# Patient Record
Sex: Female | Born: 1960 | Race: White | Hispanic: No | Marital: Married | Smoking: Never smoker
Health system: Southern US, Community
[De-identification: ages and names within clinical notes are randomized; demographics above are authoritative.]

## PROBLEM LIST (undated history)

## (undated) DIAGNOSIS — C801 Malignant (primary) neoplasm, unspecified: Secondary | ICD-10-CM

## (undated) DIAGNOSIS — J302 Other seasonal allergic rhinitis: Secondary | ICD-10-CM

## (undated) DIAGNOSIS — G43909 Migraine, unspecified, not intractable, without status migrainosus: Secondary | ICD-10-CM

## (undated) DIAGNOSIS — Z9889 Other specified postprocedural states: Secondary | ICD-10-CM

## (undated) DIAGNOSIS — E119 Type 2 diabetes mellitus without complications: Secondary | ICD-10-CM

## (undated) DIAGNOSIS — J45909 Unspecified asthma, uncomplicated: Secondary | ICD-10-CM

## (undated) DIAGNOSIS — K219 Gastro-esophageal reflux disease without esophagitis: Secondary | ICD-10-CM

## (undated) DIAGNOSIS — E669 Obesity, unspecified: Secondary | ICD-10-CM

## (undated) DIAGNOSIS — Z803 Family history of malignant neoplasm of breast: Secondary | ICD-10-CM

## (undated) DIAGNOSIS — G4730 Sleep apnea, unspecified: Secondary | ICD-10-CM

## (undated) DIAGNOSIS — Z923 Personal history of irradiation: Secondary | ICD-10-CM

## (undated) DIAGNOSIS — Z8049 Family history of malignant neoplasm of other genital organs: Secondary | ICD-10-CM

## (undated) DIAGNOSIS — K760 Fatty (change of) liver, not elsewhere classified: Secondary | ICD-10-CM

## (undated) DIAGNOSIS — R112 Nausea with vomiting, unspecified: Secondary | ICD-10-CM

## (undated) DIAGNOSIS — E785 Hyperlipidemia, unspecified: Secondary | ICD-10-CM

## (undated) DIAGNOSIS — M1990 Unspecified osteoarthritis, unspecified site: Secondary | ICD-10-CM

## (undated) DIAGNOSIS — Z800 Family history of malignant neoplasm of digestive organs: Secondary | ICD-10-CM

## (undated) DIAGNOSIS — T8859XA Other complications of anesthesia, initial encounter: Secondary | ICD-10-CM

## (undated) DIAGNOSIS — K589 Irritable bowel syndrome without diarrhea: Secondary | ICD-10-CM

## (undated) DIAGNOSIS — D649 Anemia, unspecified: Secondary | ICD-10-CM

## (undated) DIAGNOSIS — T4145XA Adverse effect of unspecified anesthetic, initial encounter: Secondary | ICD-10-CM

## (undated) DIAGNOSIS — M5030 Other cervical disc degeneration, unspecified cervical region: Secondary | ICD-10-CM

## (undated) DIAGNOSIS — Z87442 Personal history of urinary calculi: Secondary | ICD-10-CM

## (undated) HISTORY — PX: EXCISION OF ENDOMETRIOMA: SHX6473

## (undated) HISTORY — PX: ABDOMINAL HYSTERECTOMY: SHX81

## (undated) HISTORY — DX: Family history of malignant neoplasm of breast: Z80.3

## (undated) HISTORY — DX: Family history of malignant neoplasm of other genital organs: Z80.49

## (undated) HISTORY — PX: CHOLECYSTECTOMY: SHX55

## (undated) HISTORY — PX: TONSILLECTOMY: SUR1361

## (undated) HISTORY — DX: Family history of malignant neoplasm of digestive organs: Z80.0

---
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
## 2015-05-16 ENCOUNTER — Ambulatory Visit
Admission: EM | Admit: 2015-05-16 | Discharge: 2015-05-16 | Disposition: A | Discharge status: Home / Self Care | Payer: BLUE CROSS/BLUE SHIELD | Attending: Family Medicine | Admitting: Family Medicine

## 2015-05-16 ENCOUNTER — Ambulatory Visit: Payer: BLUE CROSS/BLUE SHIELD

## 2015-05-16 DIAGNOSIS — J40 Bronchitis, not specified as acute or chronic: Secondary | ICD-10-CM | POA: Insufficient documentation

## 2015-05-16 DIAGNOSIS — R05 Cough: Secondary | ICD-10-CM | POA: Diagnosis present

## 2015-05-16 HISTORY — DX: Migraine, unspecified, not intractable, without status migrainosus: G43.909

## 2015-05-16 HISTORY — DX: Unspecified asthma, uncomplicated: J45.909

## 2015-05-16 MED ORDER — LEVOFLOXACIN 500 MG PO TABS: 500.0000 mg | ORAL_TABLET | Freq: Every day | ORAL | Status: DC

## 2015-05-16 MED ORDER — ALBUTEROL SULFATE HFA 108 (90 BASE) MCG/ACT IN AERS: 1.0000 | INHALATION_SPRAY | Freq: Four times a day (QID) | RESPIRATORY_TRACT | Status: DC | PRN

## 2015-05-16 NOTE — ED Notes (Signed)
Pt states "I completed Augmentin on July first but I still have chest and nasal congestion. I have a brown productive cough. They did not do a chest xray when I was there. I have be sweaty at night, but know documented fevers."

## 2015-05-16 NOTE — ED Provider Notes (Signed)
Patient presents today with symptoms of nasal congestion and productive cough. Patient states that she recently finished Augmentin beginning of July for upper respiratory infection. She has been having sweats at night, and a yellow brown productive cough for the last few days. She is afebrile in the office today. She does admit to history of asthma but does not take any inhalers. She denies any smoking history. She denies any recent weight loss or international travel.  Review of systems negative except mentioned above.  Vitals as per Epic  GENERAL: NAD HEENT: mild pharyngeal erythema, no exudate, no erythema of TMs, no cervical LAD RESP: CTA B CARD: RRR NEURO: CN II-XII grossly intact   A/P: Bronchitis-chest x-ray does not show an obvious pneumonia but does suggest bronchitis, patient is not toxic in the office. Will prescribe patient a week of Levaquin and Albuterol Inhaler to use as needed. She can continue her cough suppressant as she has been taking. If symptoms do persist or worsen she is to seek medical attention as discussed.  Paulina Fusi, MD 05/16/15 [phone]

## 2015-05-19 ENCOUNTER — Encounter: Payer: Self-pay | Admitting: Emergency Medicine

## 2015-05-19 ENCOUNTER — Emergency Department
Admission: EM | Admit: 2015-05-19 | Discharge: 2015-05-19 | Disposition: A | Discharge status: Home / Self Care | Payer: BLUE CROSS/BLUE SHIELD | Attending: Emergency Medicine | Admitting: Emergency Medicine

## 2015-05-19 ENCOUNTER — Emergency Department: Payer: BLUE CROSS/BLUE SHIELD

## 2015-05-19 DIAGNOSIS — M25561 Pain in right knee: Secondary | ICD-10-CM

## 2015-05-19 DIAGNOSIS — Z79899 Other long term (current) drug therapy: Secondary | ICD-10-CM | POA: Diagnosis not present

## 2015-05-19 HISTORY — DX: Other seasonal allergic rhinitis: J30.2

## 2015-05-19 HISTORY — DX: Irritable bowel syndrome, unspecified: K58.9

## 2015-05-19 HISTORY — DX: Gastro-esophageal reflux disease without esophagitis: K21.9

## 2015-05-19 MED ORDER — HYDROCODONE-ACETAMINOPHEN 5-325 MG PO TABS
1.0000 | ORAL_TABLET | Freq: Once | ORAL | Status: AC
  Administered 2015-05-19: 1 via ORAL
  Filled 2015-05-19: qty 1

## 2015-05-19 MED ORDER — HYDROCODONE-ACETAMINOPHEN 5-325 MG PO TABS: 1.0000 | ORAL_TABLET | ORAL | Status: DC | PRN

## 2015-05-19 NOTE — ED Notes (Signed)
States has not had ain like this before

## 2015-05-19 NOTE — ED Provider Notes (Signed)
[facility] Emergency Department Provider Note ____________________________________________  Time seen: Approximately 8:52 PM  I have reviewed the triage vital signs and the nursing notes.   HISTORY  Chief Complaint Knee Pain   HPI Diane Cox is a 54 y.o. female who presents to the emergency department for pain in the right posterior knee. She denies injury. She denies associated symptoms such as chest pain or shortness of breath. She states that the knee feels somewhat weak upon standing initially but then is fine when she ambulates. She is concerned that she just started Levaquin and read the side effects are tendon rupture.   Past Medical History  Diagnosis Date  . Asthma   . Migraines   . Seasonal allergies   . IBS (irritable bowel syndrome)   . GERD (gastroesophageal reflux disease)     There are no active problems to display for this patient.   Past Surgical History  Procedure Laterality Date  . Tonsillectomy    . Cholecystectomy    . Abdominal hysterectomy      Current Outpatient Rx  Name  Route  Sig  Dispense  Refill  . cholecalciferol (VITAMIN D) 1000 UNITS tablet   Oral   Take 2,000 Units by mouth daily.         Marland Kitchen albuterol (PROVENTIL HFA;VENTOLIN HFA) 108 (90 BASE) MCG/ACT inhaler   Inhalation   Inhale 1-2 puffs into the lungs every 6 (six) hours as needed for wheezing or shortness of breath.   1 Inhaler   0   . atorvastatin (LIPITOR) 10 MG tablet   Oral   Take 10 mg by mouth daily.         . benzonatate (TESSALON) 100 MG capsule   Oral   Take by mouth 3 (three) times daily as needed for cough.         Marland Kitchen buPROPion (WELLBUTRIN XL) 150 MG 24 hr tablet   Oral   Take 150 mg by mouth daily.         . carisoprodol (SOMA) 350 MG tablet   Oral   Take 350 mg by mouth 3 (three) times daily between meals as needed for muscle spasms.         . celecoxib (CELEBREX) 200 MG capsule   Oral   Take 200 mg by mouth  daily.         Marland Kitchen escitalopram (LEXAPRO) 5 MG tablet   Oral   Take 5 mg by mouth daily.         . hydrochlorothiazide (MICROZIDE) 12.5 MG capsule   Oral   Take 12.5 mg by mouth daily.         Marland Kitchen HYDROcodone-acetaminophen (NORCO/VICODIN) 5-325 MG per tablet   Oral   Take 1 tablet by mouth every 4 (four) hours as needed for moderate pain.   20 tablet   0   . hyoscyamine (LEVBID) 0.375 MG 12 hr tablet   Oral   Take 0.375 mg by mouth 2 (two) times daily.         Marland Kitchen levofloxacin (LEVAQUIN) 500 MG tablet   Oral   Take 1 tablet (500 mg total) by mouth daily.   7 tablet   0   . montelukast (SINGULAIR) 10 MG tablet   Oral   Take 10 mg by mouth at bedtime.         Marland Kitchen omeprazole (PRILOSEC) 20 MG capsule   Oral   Take 20 mg by mouth daily.         Marland Kitchen  sucralfate (CARAFATE) 1 G tablet   Oral   Take 1 g by mouth 4 (four) times daily -  with meals and at bedtime.         . topiramate (TOPAMAX) 25 MG capsule   Oral   Take 25 mg by mouth 2 (two) times daily.           Allergies Compazine  No family history on file.  Social History History  Substance Use Topics  . Smoking status: Never Smoker   . Smokeless tobacco: Not on file  . Alcohol Use: Yes    Review of Systems Constitutional: No recent illness. Eyes: No visual changes. ENT: No sore throat. Cardiovascular: Denies chest pain or palpitations. Respiratory: Denies shortness of breath. Gastrointestinal: No abdominal pain.  Genitourinary: Negative for dysuria. Musculoskeletal: Pain in right posterior knee with radiation into ankle Skin: Negative for rash. Neurological: Negative for headaches, focal weakness or numbness. 10-point ROS otherwise negative.  ____________________________________________   PHYSICAL EXAM:  VITAL SIGNS: ED Triage Vitals  Enc Vitals Group     BP 05/19/15 1756 119/79 mmHg     Pulse Rate 05/19/15 1756 100     Resp 05/19/15 1756 18     Temp 05/19/15 1756 98.3 F (36.8 C)      Temp Source 05/19/15 1756 Oral     SpO2 05/19/15 1756 99 %     Weight 05/19/15 1756 225 lb (102.059 kg)     Height 05/19/15 1756 5\' 1"  (1.549 m)     Head Cir --      Peak Flow --      Pain Score 05/19/15 1801 9     Pain Loc --      Pain Edu? --      Excl. in [place]? --     Constitutional: Alert and oriented. Well appearing and in no acute distress. Eyes: Conjunctivae are normal. EOMI. Head: Atraumatic. Nose: No congestion/rhinnorhea. Neck: No stridor.  Respiratory: Normal respiratory effort.   Musculoskeletal: ROM limited by pain.  Neurologic:  Normal speech and language. No gross focal neurologic deficits are appreciated. Speech is normal. No gait instability. Skin:  Skin is warm, dry and intact. Atraumatic. Psychiatric: Mood and affect are normal. Speech and behavior are normal.  ____________________________________________   LABS (all labs ordered are listed, but only abnormal results are displayed)  Labs Reviewed - No data to display ____________________________________________  RADIOLOGY  Negative for DVT. ____________________________________________   PROCEDURES  Procedure(s) performed: None   ____________________________________________   INITIAL IMPRESSION / ASSESSMENT AND PLAN / ED COURSE  Pertinent labs & imaging results that were available during my care of the patient were reviewed by me and considered in my medical decision making (see chart for details).  Patient was advised to follow-up with the primary care provider of her choice for symptoms that are not improving over the next few days. She was encouraged to return to the emergency department for symptoms that change or worsen if she is unable schedule an appointment. ____________________________________________   FINAL CLINICAL IMPRESSION(S) / ED DIAGNOSES  Final diagnoses:  Posterior right knee pain       Victorino Dike, FNP 05/19/15 2116  Hinda Kehr, MD 05/19/15 2207

## 2015-05-19 NOTE — Discharge Instructions (Signed)

## 2015-05-29 ENCOUNTER — Ambulatory Visit
Admission: EM | Admit: 2015-05-29 | Discharge: 2015-05-29 | Disposition: A | Discharge status: Home / Self Care | Payer: BLUE CROSS/BLUE SHIELD | Attending: Internal Medicine | Admitting: Internal Medicine

## 2015-05-29 DIAGNOSIS — R05 Cough: Secondary | ICD-10-CM | POA: Diagnosis not present

## 2015-05-29 DIAGNOSIS — R059 Cough, unspecified: Secondary | ICD-10-CM

## 2015-05-29 DIAGNOSIS — M25561 Pain in right knee: Secondary | ICD-10-CM

## 2015-05-29 MED ORDER — DICLOFENAC SODIUM 1 % TD GEL: 2.0000 g | Freq: Four times a day (QID) | TRANSDERMAL | Status: DC

## 2015-05-29 NOTE — ED Notes (Signed)
Patient states that she was seen here 2 weeks ago. She states that was given Levaquin for Bronchitis and it cause leg pain. Patient states that she has R knee popping. She states that she still has the tightness in her chest from her bronchitis, states that she coughs but only at night. States that she overall does not feel well.

## 2015-05-29 NOTE — Discharge Instructions (Signed)
Cool Mist Vaporizers °Vaporizers may help relieve the symptoms of a cough and cold. They add moisture to the air, which helps mucus to become thinner and less sticky. This makes it easier to breathe and cough up secretions. Cool mist vaporizers do not cause serious burns like hot mist vaporizers, which may also be called steamers or humidifiers. Vaporizers have not been proven to help with colds. You should not use a vaporizer if you are allergic to mold. °HOME CARE INSTRUCTIONS °· Follow the package instructions for the vaporizer. °· Do not use anything other than distilled water in the vaporizer. °· Do not run the vaporizer all of the time. This can cause mold or bacteria to grow in the vaporizer. °· Clean the vaporizer after each time it is used. °· Clean and dry the vaporizer well before storing it. °· Stop using the vaporizer if worsening respiratory symptoms develop. °Document Released: 07/17/2004 Document Revised: 10/25/2013 Document Reviewed: 03/09/2013 °ExitCare® Patient Information ©2015 [employer]. This information is not intended to replace advice given to you by your health care provider. Make sure you discuss any questions you have with your health care provider. ° °Cough, Adult ° A cough is a reflex. It helps you clear your throat and airways. A cough can help heal your body. A cough can last 2 or 3 weeks (acute) or may last more than 8 weeks (chronic). Some common causes of a cough can include an infection, allergy, or a cold. °HOME CARE °· Only take medicine as told by your doctor. °· If given, take your medicines (antibiotics) as told. Finish them even if you start to feel better. °· Use a cold steam vaporizer or humidifier in your home. This can help loosen thick spit (secretions). °· Sleep so you are almost sitting up (semi-upright). Use pillows to do this. This helps reduce coughing. °· Rest as needed. °· Stop smoking if you smoke. °GET HELP RIGHT AWAY IF: °· You have yellowish-white fluid  (pus) in your thick spit. °· Your cough gets worse. °· Your medicine does not reduce coughing, and you are losing sleep. °· You cough up blood. °· You have trouble breathing. °· Your pain gets worse and medicine does not help. °· You have a fever. °MAKE SURE YOU:  °· Understand these instructions. °· Will watch your condition. °· Will get help right away if you are not doing well or get worse. °Document Released: 07/03/2011 Document Revised: 03/06/2014 Document Reviewed: 07/03/2011 °ExitCare® Patient Information ©2015 [employer]. This information is not intended to replace advice given to you by your health care provider. Make sure you discuss any questions you have with your health care provider. ° °

## 2015-05-29 NOTE — ED Provider Notes (Signed)
CSN: [id number]     Arrival date & time 05/29/15  1304 History   First MD Initiated Contact with Patient 05/29/15 1359     Chief Complaint  Patient presents with  . Knee Pain  . Cough   (Consider location/radiation/quality/duration/timing/severity/associated sxs/prior Treatment) HPI 54 yo F reports recent bout of bronchitis.She was seen here 2 weeks ago and started on Levaquin. Stopped her Levaquin Rx after 4 days because of some right knee pain increase. Read in her meds information that tendon injury was possible.Was seen in the ER and medication stopped. Has had lonstanding arthritis and chronic knee pain prior to the Rx per our conversation but became anxious about medication. Continues to be ambulatory without assistance. Her chest is generally better for most of the day though still has some cough. Has Benzonatate at home and finds it very effective.  Past Medical History  Diagnosis Date  . Asthma   . Migraines   . Seasonal allergies   . IBS (irritable bowel syndrome)   . GERD (gastroesophageal reflux disease)    Past Surgical History  Procedure Laterality Date  . Tonsillectomy    . Cholecystectomy    . Abdominal hysterectomy     Family History  Problem Relation Age of Onset  . Heart attack Father    History  Substance Use Topics  . Smoking status: Never Smoker   . Smokeless tobacco: Not on file  . Alcohol Use: 0.0 oz/week    0 Standard drinks or equivalent per week     Comment: occasional   OB History    No data available     Review of Systems  Constitutional -afebrile Eyes-denies visual changes ENT- normal voice,denies sore throat CV-denies chest pain Resp-denies SOB, some cough still present,non-productive GI- negative for nausea,vomiting, diarrhea GU- negative for dysuria MSK- negative for back pain, ambulatory; right knee aches, no instability reported Skin- denies acute changes Neuro- negative headache,focal weakness or numbness   Allergies   Compazine  Home Medications   Prior to Admission medications   Medication Sig Start Date End Date Taking? Authorizing Provider  albuterol (PROVENTIL HFA;VENTOLIN HFA) 108 (90 BASE) MCG/ACT inhaler Inhale 1-2 puffs into the lungs every 6 (six) hours as needed for wheezing or shortness of breath. 05/16/15  Yes Paulina Fusi, MD  atorvastatin (LIPITOR) 10 MG tablet Take 10 mg by mouth daily.   Yes Historical Provider, MD  benzonatate (TESSALON) 100 MG capsule Take by mouth 3 (three) times daily as needed for cough.   Yes Historical Provider, MD  buPROPion (WELLBUTRIN XL) 150 MG 24 hr tablet Take 150 mg by mouth daily.   Yes Historical Provider, MD  carisoprodol (SOMA) 350 MG tablet Take 350 mg by mouth 3 (three) times daily between meals as needed for muscle spasms.   Yes Historical Provider, MD  celecoxib (CELEBREX) 200 MG capsule Take 200 mg by mouth daily.   Yes Historical Provider, MD  cholecalciferol (VITAMIN D) 1000 UNITS tablet Take 2,000 Units by mouth daily.   Yes Historical Provider, MD  escitalopram (LEXAPRO) 5 MG tablet Take 5 mg by mouth daily.   Yes Historical Provider, MD  hydrochlorothiazide (MICROZIDE) 12.5 MG capsule Take 12.5 mg by mouth daily.   Yes Historical Provider, MD  hyoscyamine (LEVBID) 0.375 MG 12 hr tablet Take 0.375 mg by mouth 2 (two) times daily.   Yes Historical Provider, MD  montelukast (SINGULAIR) 10 MG tablet Take 10 mg by mouth at bedtime.   Yes Historical Provider, MD  omeprazole (  PRILOSEC) 20 MG capsule Take 20 mg by mouth daily.   Yes Historical Provider, MD  sucralfate (CARAFATE) 1 G tablet Take 1 g by mouth 4 (four) times daily -  with meals and at bedtime.   Yes Historical Provider, MD  topiramate (TOPAMAX) 25 MG capsule Take 25 mg by mouth 2 (two) times daily.   Yes Historical Provider, MD  diclofenac sodium (VOLTAREN) 1 % GEL Apply 2 g topically 4 (four) times daily. 05/29/15   Jan Fireman, PA-C  HYDROcodone-acetaminophen (NORCO/VICODIN) 5-325 MG per  tablet Take 1 tablet by mouth every 4 (four) hours as needed for moderate pain. 05/19/15   Victorino Dike, FNP  levofloxacin (LEVAQUIN) 500 MG tablet Take 1 tablet (500 mg total) by mouth daily. 05/16/15   Paulina Fusi, MD   BP 125/84 mmHg  Pulse 76  Temp(Src) 97.5 F (36.4 C) (Tympanic)  Resp 16  Ht 5\' 1"  (1.549 m)  Wt 225 lb (102.059 kg)  BMI 42.54 kg/m2  SpO2 98% Physical Exam  Constitutional -alert and oriented,well appearing and in no acute distress Head-atraumatic, normocephalic Eyes- conjunctiva normal, EOMI ,conjugate gaze Ears- clear Nose- no congestion or rhinorrhea Mouth/throat- mucous membranes moist ,oropharynx non-erythematous Neck- supple without glandular enlargement CV- regular rate, grossly normal heart sounds,  Resp-no distress, normal respiratory effort,clear to auscultation bilaterally GI-,no distention GU-not examined MSK- no tender, normal ROM, all extremities, ambulatory. Right knee with palpable crepitus, left knee less so. DTRs equal, good pulses, ambulates with minimal asymmetry, mild antalgia Neuro- normal speech and language, no gross focal neurological deficit appreciated,  Skin-warm,dry ,intact; no rash noted Psych-mood and affect grossly normal; speech and behavior grossly normal ED Course  Procedures (including critical care time) Labs Review Labs Reviewed - No data to display  Imaging Review No results found.   MDM   1. Cough   2. Right knee pain    Diagnosis and treatment discussed.-  Doubt any need to reinstate antibiotic at this time .She is afebrile and we have discussed the common viral basis for bronchitis. She has inhaler and Singulair, add Steam, hydration and cough medicines as needed.  Has Benzonatate . Discussed post viral tussive syndrome. Her knee appears to be chronic changes of arthritic nature.She denies any significant increase in her limitation. Use heat or ice wraps as comfortable. Given Rx for Voltaren gel as her GERD  precludes the use of chronic NSAIDs   Questions fielded, expectations and recommendations reviewed. Patient expresses understanding. Will return to [facility] with questions, concern or exacerbation.     Jan Fireman, PA-C 05/30/15 2251

## 2015-07-23 ENCOUNTER — Emergency Department: Payer: BLUE CROSS/BLUE SHIELD

## 2015-07-23 ENCOUNTER — Emergency Department
Admission: EM | Admit: 2015-07-23 | Discharge: 2015-07-23 | Disposition: A | Discharge status: Home / Self Care | Payer: BLUE CROSS/BLUE SHIELD | Attending: Emergency Medicine | Admitting: Emergency Medicine

## 2015-07-23 ENCOUNTER — Encounter: Payer: Self-pay | Admitting: Emergency Medicine

## 2015-07-23 DIAGNOSIS — J45901 Unspecified asthma with (acute) exacerbation: Secondary | ICD-10-CM | POA: Diagnosis not present

## 2015-07-23 DIAGNOSIS — Z792 Long term (current) use of antibiotics: Secondary | ICD-10-CM | POA: Insufficient documentation

## 2015-07-23 DIAGNOSIS — Z791 Long term (current) use of non-steroidal anti-inflammatories (NSAID): Secondary | ICD-10-CM | POA: Insufficient documentation

## 2015-07-23 DIAGNOSIS — Z79899 Other long term (current) drug therapy: Secondary | ICD-10-CM | POA: Insufficient documentation

## 2015-07-23 DIAGNOSIS — R0602 Shortness of breath: Secondary | ICD-10-CM | POA: Diagnosis present

## 2015-07-23 MED ORDER — IPRATROPIUM-ALBUTEROL 0.5-2.5 (3) MG/3ML IN SOLN
3.0000 mL | Freq: Once | RESPIRATORY_TRACT | Status: AC
  Administered 2015-07-23: 3 mL via RESPIRATORY_TRACT

## 2015-07-23 MED ORDER — BUDESONIDE 90 MCG/ACT IN AEPB: 1.0000 | INHALATION_SPRAY | Freq: Two times a day (BID) | RESPIRATORY_TRACT | Status: DC

## 2015-07-23 MED ORDER — PREDNISONE 20 MG PO TABS: 60.0000 mg | ORAL_TABLET | Freq: Every day | ORAL | Status: DC

## 2015-07-23 MED ORDER — IPRATROPIUM-ALBUTEROL 0.5-2.5 (3) MG/3ML IN SOLN
RESPIRATORY_TRACT | Status: AC
  Administered 2015-07-23: 3 mL via RESPIRATORY_TRACT
  Filled 2015-07-23: qty 3

## 2015-07-23 MED ORDER — ALBUTEROL SULFATE HFA 108 (90 BASE) MCG/ACT IN AERS
INHALATION_SPRAY | RESPIRATORY_TRACT | Status: AC
Start: 2015-07-23 — End: ?

## 2015-07-23 NOTE — ED Provider Notes (Signed)
[facility] Emergency Department Provider Note  ____________________________________________  Time seen: Approximately 3:49 PM  I have reviewed the triage vital signs and the nursing notes.   HISTORY  Chief Complaint Shortness of Breath    HPI Diane Cox is a 54 y.o. female with a history of asthma, seasonal allergies, IBS, and GERD who presents with shortness of breath that she states feels like prior asthma exacerbations.  She states that she has been having increased mild shortness of breath for the last 2 weeks, especially after multiple allergic exposures and her various places of work.  She occasionally has some chest tightness associated with her acute shortness of breath and wheezing but not in the last few days.  Today she felt like her symptoms were worse with wheezing and difficulty breathing.  She has been using her albuterol inhaler but it has not been helping.  She states that she stopped using her Pulmicort about a year ago because she thought she was well enough under control that she did not need to take it, but she thinks that she should start taking it again.  She currently does not have a primary care doctor but she has an appointment over the next month at [facility] to establish primary care.  She received a DuoNeb prior to to my assessment is she states that she feels much better now.   Past Medical History  Diagnosis Date  . Asthma   . Migraines   . Seasonal allergies   . IBS (irritable bowel syndrome)   . GERD (gastroesophageal reflux disease)     There are no active problems to display for this patient.   Past Surgical History  Procedure Laterality Date  . Tonsillectomy    . Cholecystectomy    . Abdominal hysterectomy      Current Outpatient Rx  Name  Route  Sig  Dispense  Refill  . albuterol (PROVENTIL HFA;VENTOLIN HFA) 108 (90 BASE) MCG/ACT inhaler      Inhale 4-6 puffs by mouth every 4 hours as needed for  wheezing, cough, and/or shortness of breath   1 Inhaler   1   . atorvastatin (LIPITOR) 10 MG tablet   Oral   Take 10 mg by mouth daily.         . benzonatate (TESSALON) 100 MG capsule   Oral   Take by mouth 3 (three) times daily as needed for cough.         . Budesonide (PULMICORT FLEXHALER) 90 MCG/ACT inhaler   Inhalation   Inhale 1 puff into the lungs 2 (two) times daily.   1 Inhaler   0   . buPROPion (WELLBUTRIN XL) 150 MG 24 hr tablet   Oral   Take 150 mg by mouth daily.         . carisoprodol (SOMA) 350 MG tablet   Oral   Take 350 mg by mouth 3 (three) times daily between meals as needed for muscle spasms.         . celecoxib (CELEBREX) 200 MG capsule   Oral   Take 200 mg by mouth daily.         . cholecalciferol (VITAMIN D) 1000 UNITS tablet   Oral   Take 2,000 Units by mouth daily.         . diclofenac sodium (VOLTAREN) 1 % GEL   Topical   Apply 2 g topically 4 (four) times daily.   100 g   0   . escitalopram (  LEXAPRO) 5 MG tablet   Oral   Take 5 mg by mouth daily.         . hydrochlorothiazide (MICROZIDE) 12.5 MG capsule   Oral   Take 12.5 mg by mouth daily.         Marland Kitchen HYDROcodone-acetaminophen (NORCO/VICODIN) 5-325 MG per tablet   Oral   Take 1 tablet by mouth every 4 (four) hours as needed for moderate pain.   20 tablet   0   . hyoscyamine (LEVBID) 0.375 MG 12 hr tablet   Oral   Take 0.375 mg by mouth 2 (two) times daily.         Marland Kitchen levofloxacin (LEVAQUIN) 500 MG tablet   Oral   Take 1 tablet (500 mg total) by mouth daily.   7 tablet   0   . montelukast (SINGULAIR) 10 MG tablet   Oral   Take 10 mg by mouth at bedtime.         Marland Kitchen omeprazole (PRILOSEC) 20 MG capsule   Oral   Take 20 mg by mouth daily.         . predniSONE (DELTASONE) 20 MG tablet   Oral   Take 3 tablets (60 mg total) by mouth daily.   15 tablet   0   . sucralfate (CARAFATE) 1 G tablet   Oral   Take 1 g by mouth 4 (four) times daily -  with  meals and at bedtime.         . topiramate (TOPAMAX) 25 MG capsule   Oral   Take 25 mg by mouth 2 (two) times daily.           Allergies Compazine  Family History  Problem Relation Age of Onset  . Heart attack Father     Social History Social History  Substance Use Topics  . Smoking status: Never Smoker   . Smokeless tobacco: None  . Alcohol Use: 0.0 oz/week    0 Standard drinks or equivalent per week     Comment: occasional    Review of Systems Constitutional: No fever/chills Eyes: No visual changes. ENT: No sore throat. Cardiovascular: Denies chest pain. Respiratory: Mild shortness of breath and wheezing intermittently for 2 weeks Gastrointestinal: No abdominal pain.  No nausea, no vomiting.  No diarrhea.  No constipation. Genitourinary: Negative for dysuria. Musculoskeletal: Negative for back pain. Skin: Negative for rash. Neurological: Negative for headaches, focal weakness or numbness.  10-point ROS otherwise negative.  ____________________________________________   PHYSICAL EXAM:  VITAL SIGNS: ED Triage Vitals  Enc Vitals Group     BP 07/23/15 1248 154/84 mmHg     Pulse Rate 07/23/15 1248 118     Resp 07/23/15 1248 24     Temp 07/23/15 1248 97.9 F (36.6 C)     Temp Source 07/23/15 1248 Oral     SpO2 07/23/15 1248 96 %     Weight 07/23/15 1248 221 lb (100.245 kg)     Height 07/23/15 1248 5\' 1"  (1.549 m)     Head Cir --      Peak Flow --      Pain Score 07/23/15 1249 7     Pain Loc --      Pain Edu? --      Excl. in [place]? --     Constitutional: Alert and oriented. Well appearing and in no acute distress. Eyes: Conjunctivae are normal. PERRL. EOMI. Head: Atraumatic. Nose: No congestion/rhinnorhea. Mouth/Throat: Mucous membranes are moist.  Oropharynx non-erythematous. Neck:  No stridor.   Cardiovascular: Normal rate, regular rhythm. Grossly normal heart sounds.  Good peripheral circulation. Respiratory: Normal respiratory effort.  No  retractions.  Very minimal expiratory wheeze at the very end of expiration. Gastrointestinal: Soft and nontender. No distention. No abdominal bruits. No CVA tenderness. Musculoskeletal: No lower extremity tenderness nor edema.  No joint effusions. Neurologic:  Normal speech and language. No gross focal neurologic deficits are appreciated.  Skin:  Skin is warm, dry and intact. No rash noted. Psychiatric: Mood and affect are normal. Speech and behavior are normal.  ____________________________________________   LABS (all labs ordered are listed, but only abnormal results are displayed)  Labs Reviewed - No data to display ____________________________________________  EKG  Not indicated ____________________________________________  RADIOLOGY   Dg Chest 2 View  07/23/2015   CLINICAL DATA:  Intermittent asthma attacks this weekend with cough and shortness of breath today  EXAM: CHEST  2 VIEW  COMPARISON:  05/16/2015  FINDINGS: Heart size and vascular pattern normal. Mild perihilar bronchitic change stable. No consolidation or effusion.  IMPRESSION: No active cardiopulmonary disease.   Electronically Signed   By: Skipper Cliche M.D.   On: 07/23/2015 16:28    ____________________________________________   PROCEDURES  Procedure(s) performed: None  Critical Care performed: No ____________________________________________   INITIAL IMPRESSION / ASSESSMENT AND PLAN / ED COURSE  Pertinent labs & imaging results that were available during my care of the patient were reviewed by me and considered in my medical decision making (see chart for details).  Everything the patient describes as consistent with asthma exacerbation.  She feels much better after the DuoNeb.  She has never had any chest pain, just some tightness associated with the wheezing.  I am going to evaluate with a chest x-ray but I expect that her feet negative.  She feels "great" right now and states that she wants to go  home.  However she does agree to await the results of the chest x-ray.  I will treat her empirically with prednisone and I will prescribe Pulmicort for her since she was on previously.  I encouraged her to continue with her plan to establish a primary care doctor.  ____________________________________________  FINAL CLINICAL IMPRESSION(S) / ED DIAGNOSES  Final diagnoses:  Asthma exacerbation      NEW MEDICATIONS STARTED DURING THIS VISIT:  New Prescriptions   ALBUTEROL (PROVENTIL HFA;VENTOLIN HFA) 108 (90 BASE) MCG/ACT INHALER    Inhale 4-6 puffs by mouth every 4 hours as needed for wheezing, cough, and/or shortness of breath   BUDESONIDE (PULMICORT FLEXHALER) 90 MCG/ACT INHALER    Inhale 1 puff into the lungs 2 (two) times daily.   PREDNISONE (DELTASONE) 20 MG TABLET    Take 3 tablets (60 mg total) by mouth daily.     Hinda Kehr, MD 07/23/15 [phone]

## 2015-07-23 NOTE — ED Notes (Signed)
Pt reports intermittent asthma attacks this weekend, reports last one started about 60min PTA, pt reports she is out of her prescription inhalers, reports using rescue inhaler increasingly for past 2 weeks.

## 2015-07-23 NOTE — ED Notes (Signed)
Pt received duoneb at 12:52.  She states that this did help and that her breathing is better.

## 2015-07-23 NOTE — Discharge Instructions (Signed)
We believe that your symptoms are caused today by an exacerbation of your asthma.  Please take the prescribed medications and any medications that you have at home.  Follow up with your doctor as recommended.  If you develop any new or worsening symptoms, including but not limited to fever, persistent vomiting, worsening shortness of breath, or other symptoms that concern you, please return to the Emergency Department immediately.   Asthma Asthma is a recurring condition in which the airways tighten and narrow. Asthma can make it difficult to breathe. It can cause coughing, wheezing, and shortness of breath. Asthma episodes, also called asthma attacks, range from minor to life-threatening. Asthma cannot be cured, but medicines and lifestyle changes can help control it. CAUSES Asthma is believed to be caused by inherited (genetic) and environmental factors, but its exact cause is unknown. Asthma may be triggered by allergens, lung infections, or irritants in the air. Asthma triggers are different for each person. Common triggers include:  1. Animal dander. 2. Dust mites. 3. Cockroaches. 4. Pollen from trees or grass. 5. Mold. 6. Smoke. 7. Air pollutants such as dust, household cleaners, hair sprays, aerosol sprays, paint fumes, strong chemicals, or strong odors. 8. Cold air, weather changes, and winds (which increase molds and pollens in the air). 9. Strong emotional expressions such as crying or laughing hard. 10. Stress. 11. Certain medicines (such as aspirin) or types of drugs (such as beta-blockers). 12. Sulfites in foods and drinks. Foods and drinks that may contain sulfites include dried fruit, potato chips, and sparkling grape juice. 13. Infections or inflammatory conditions such as the flu, a cold, or an inflammation of the nasal membranes (rhinitis). 14. Gastroesophageal reflux disease (GERD). 15. Exercise or strenuous activity. SYMPTOMS Symptoms may occur immediately after asthma is  triggered or many hours later. Symptoms include: 1. Wheezing. 2. Excessive nighttime or early morning coughing. 3. Frequent or severe coughing with a common cold. 4. Chest tightness. 5. Shortness of breath. DIAGNOSIS  The diagnosis of asthma is made by a review of your medical history and a physical exam. Tests may also be performed. These may include:  Lung function studies. These tests show how much air you breathe in and out.  Allergy tests.  Imaging tests such as X-rays. TREATMENT  Asthma cannot be cured, but it can usually be controlled. Treatment involves identifying and avoiding your asthma triggers. It also involves medicines. There are 2 classes of medicine used for asthma treatment:   Controller medicines. These prevent asthma symptoms from occurring. They are usually taken every day.  Reliever or rescue medicines. These quickly relieve asthma symptoms. They are used as needed and provide short-term relief. Your health care provider will help you create an asthma action plan. An asthma action plan is a written plan for managing and treating your asthma attacks. It includes a list of your asthma triggers and how they may be avoided. It also includes information on when medicines should be taken and when their dosage should be changed. An action plan may also involve the use of a device called a peak flow meter. A peak flow meter measures how well the lungs are working. It helps you monitor your condition. HOME CARE INSTRUCTIONS   Take medicines only as directed by your health care provider. Speak with your health care provider if you have questions about how or when to take the medicines.  Use a peak flow meter as directed by your health care provider. Record and keep track of readings.  Understand and use the action plan to help minimize or stop an asthma attack without needing to seek medical care.  Control your home environment in the following ways to help prevent asthma  attacks:  Do not smoke. Avoid being exposed to secondhand smoke.  Change your heating and air conditioning filter regularly.  Limit your use of fireplaces and wood stoves.  Get rid of pests (such as roaches and mice) and their droppings.  Throw away plants if you see mold on them.  Clean your floors and dust regularly. Use unscented cleaning products.  Try to have someone else vacuum for you regularly. Stay out of rooms while they are being vacuumed and for a short while afterward. If you vacuum, use a dust mask from a hardware store, a double-layered or microfilter vacuum cleaner bag, or a vacuum cleaner with a HEPA filter.  Replace carpet with wood, tile, or vinyl flooring. Carpet can trap dander and dust.  Use allergy-proof pillows, mattress covers, and box spring covers.  Wash bed sheets and blankets every week in hot water and dry them in a dryer.  Use blankets that are made of polyester or cotton.  Clean bathrooms and kitchens with bleach. If possible, have someone repaint the walls in these rooms with mold-resistant paint. Keep out of the rooms that are being cleaned and painted.  Wash hands frequently. SEEK MEDICAL CARE IF:   You have wheezing, shortness of breath, or a cough even if taking medicine to prevent attacks.  The colored mucus you cough up (sputum) is thicker than usual.  Your sputum changes from clear or white to yellow, green, gray, or bloody.  You have any problems that may be related to the medicines you are taking (such as a rash, itching, swelling, or trouble breathing).  You are using a reliever medicine more than 2-3 times per week.  Your peak flow is still at 50-79% of your personal best after following your action plan for 1 hour.  You have a fever. SEEK IMMEDIATE MEDICAL CARE IF:   You seem to be getting worse and are unresponsive to treatment during an asthma attack.  You are short of breath even at rest.  You get short of breath when  doing very little physical activity.  You have difficulty eating, drinking, or talking due to asthma symptoms.  You develop chest pain.  You develop a fast heartbeat.  You have a bluish color to your lips or fingernails.  You are light-headed, dizzy, or faint.  Your peak flow is less than 50% of your personal best. MAKE SURE YOU:   Understand these instructions.  Will watch your condition.  Will get help right away if you are not doing well or get worse. Document Released: 10/20/2005 Document Revised: 03/06/2014 Document Reviewed: 05/19/2013 [facility] Patient Information 2015 [place], [place]. This information is not intended to replace advice given to you by your health care provider. Make sure you discuss any questions you have with your health care provider.  How to Use a Nebulizer If you have asthma or other breathing problems, you might need to breathe in (inhale) medicine. This can be done with a nebulizer. A nebulizer is a device that turns liquid medicine into a mist that you can inhale.  There are different kinds of nebulizers. Most are small. With some, you breathe in through a mouthpiece. With others, a mask fits over your nose and mouth. Most nebulizers must be connected to a small air compressor. Air is forced  through tubing from the compressor to the nebulizer. The forced air changes the liquid into a fine spray. RISKS AND COMPLICATIONS The nebulizer must work properly for it to help your breathing. If the nebulizer does not produce mist, or if foam comes out, this indicates that the nebulizer is not working properly. Sometimes a filter can get clogged, or there might be a problem with the air compressor. Check the instruction booklet that came with your nebulizer. It should tell you how to fix problems or where to call for help. You should have at least one extra nebulizer at home. That way, you will always have one when you need it.  HOW TO PREPARE BEFORE USING THE  NEBULIZER Take these steps before using the nebulizer: 16. Check your medicine. Make sure it has not expired and is not damaged in any way.  58. Wash your hands with soap and water.  18. Put all the parts of your nebulizer on a sturdy, flat surface. Make sure the tubing connects the compressor and the nebulizer. 19. Measure the liquid medicine according to your health care provider's instructions. Pour it into the nebulizer. 20. Attach the mouthpiece or mask.  21. Test the nebulizer by turning it on to make sure a spray is coming out. Then, turn it off.  HOW TO USE THE NEBULIZER 6. Sit down and focus on staying relaxed.  7. If your nebulizer has a mask, put it over your nose and mouth. If you use a mouthpiece, put it in your mouth. Press your lips firmly around the mouthpiece. 8. Turn on the nebulizer.  9. Breathe out.  10. Some nebulizers have a finger valve. If yours does, cover up the air hole so the air gets to the nebulizer. 11. Once the medicine begins to mist out, take slow, deep breaths. If there is a finger valve, release it at the end of your breath. 12. Continue taking slow, deep breaths until the nebulizer is empty.  Be sure to stop the machine at any point if you start coughing or if the medicine foams or bubbles. HOW TO CLEAN THE NEBULIZER  The nebulizer and all its parts must be kept very clean. Follow the manufacturer's instructions for cleaning. For most nebulizers, you should follow these guidelines:  Wash the nebulizer after each use. Use warm water and soap. Rinse it well. Shake the nebulizer to remove extra water. Put it on a clean towel until it is completely dry. To make sure it is dry, put the nebulizer back together. Turn on the compressor for a few minutes. This will blow air through the nebulizer.   Do not wash the tubing or the finger valve.   Store the nebulizer in a dust-free place.   Inspect the filter every week. Replace it any time it looks dirty.    Sometimes the nebulizer will need a more complete cleaning. The instruction booklet should say how often you need to do this. SEEK MEDICAL CARE IF:   You continue to have difficulty breathing.   You have trouble using the nebulizer.  Document Released: 10/08/2009 Document Revised: 03/06/2014 Document Reviewed: 04/11/2013 [facility] Patient Information 2015 [place], [place]. This information is not intended to replace advice given to you by your health care provider. Make sure you discuss any questions you have with your health care provider.  How to Use an Inhaler Proper inhaler technique is very important. Good technique ensures that the medicine reaches the lungs. Poor technique results in depositing the medicine  on the tongue and back of the throat rather than in the airways. If you do not use the inhaler with good technique, the medicine will not help you. STEPS TO FOLLOW IF USING AN INHALER WITHOUT AN EXTENSION TUBE 22. Remove the cap from the inhaler. 23. If you are using the inhaler for the first time, you will need to prime it. Shake the inhaler for 5 seconds and release four puffs into the air, away from your face. Ask your health care provider or pharmacist if you have questions about priming your inhaler. 24. Shake the inhaler for 5 seconds before each breath in (inhalation). 25. Position the inhaler so that the top of the canister faces up. 26. Put your index finger on the top of the medicine canister. Your thumb supports the bottom of the inhaler. 27. Open your mouth. 28. Either place the inhaler between your teeth and place your lips tightly around the mouthpiece, or hold the inhaler 1-2 inches away from your open mouth. If you are unsure of which technique to use, ask your health care provider. 29. Breathe out (exhale) normally and as completely as possible. 30. Press the canister down with your index finger to release the medicine. 31. At the same time as the canister is  pressed, inhale deeply and slowly until your lungs are completely filled. This should take 4-6 seconds. Keep your tongue down. 32. Hold the medicine in your lungs for 5-10 seconds (10 seconds is best). This helps the medicine get into the small airways of your lungs. 33. Breathe out slowly, through pursed lips. Whistling is an example of pursed lips. 34. Wait at least 15-30 seconds between puffs. Continue with the above steps until you have taken the number of puffs your health care provider has ordered. Do not use the inhaler more than your health care provider tells you. 35. Replace the cap on the inhaler. 36. Follow the directions from your health care provider or the inhaler insert for cleaning the inhaler. STEPS TO FOLLOW IF USING AN INHALER WITH AN EXTENSION (SPACER) 13. Remove the cap from the inhaler. 14. If you are using the inhaler for the first time, you will need to prime it. Shake the inhaler for 5 seconds and release four puffs into the air, away from your face. Ask your health care provider or pharmacist if you have questions about priming your inhaler. 15. Shake the inhaler for 5 seconds before each breath in (inhalation). 16. Place the open end of the spacer onto the mouthpiece of the inhaler. 17. Position the inhaler so that the top of the canister faces up and the spacer mouthpiece faces you. 18. Put your index finger on the top of the medicine canister. Your thumb supports the bottom of the inhaler and the spacer. 19. Breathe out (exhale) normally and as completely as possible. 20. Immediately after exhaling, place the spacer between your teeth and into your mouth. Close your lips tightly around the spacer. 21. Press the canister down with your index finger to release the medicine. 22. At the same time as the canister is pressed, inhale deeply and slowly until your lungs are completely filled. This should take 4-6 seconds. Keep your tongue down and out of the way. 23. Hold the  medicine in your lungs for 5-10 seconds (10 seconds is best). This helps the medicine get into the small airways of your lungs. Exhale. 24. Repeat inhaling deeply through the spacer mouthpiece. Again hold that breath for up to 10  seconds (10 seconds is best). Exhale slowly. If it is difficult to take this second deep breath through the spacer, breathe normally several times through the spacer. Remove the spacer from your mouth. 25. Wait at least 15-30 seconds between puffs. Continue with the above steps until you have taken the number of puffs your health care provider has ordered. Do not use the inhaler more than your health care provider tells you. 26. Remove the spacer from the inhaler, and place the cap on the inhaler. 27. Follow the directions from your health care provider or the inhaler insert for cleaning the inhaler and spacer. If you are using different kinds of inhalers, use your quick relief medicine to open the airways 10-15 minutes before using a steroid if instructed to do so by your health care provider. If you are unsure which inhalers to use and the order of using them, ask your health care provider, nurse, or respiratory therapist. If you are using a steroid inhaler, always rinse your mouth with water after your last puff, then gargle and spit out the water. Do not swallow the water. AVOID:  Inhaling before or after starting the spray of medicine. It takes practice to coordinate your breathing with triggering the spray.  Inhaling through the nose (rather than the mouth) when triggering the spray. HOW TO DETERMINE IF YOUR INHALER IS FULL OR NEARLY EMPTY You cannot know when an inhaler is empty by shaking it. A few inhalers are now being made with dose counters. Ask your health care provider for a prescription that has a dose counter if you feel you need that extra help. If your inhaler does not have a counter, ask your health care provider to help you determine the date you need to  refill your inhaler. Write the refill date on a calendar or your inhaler canister. Refill your inhaler 7-10 days before it runs out. Be sure to keep an adequate supply of medicine. This includes making sure it is not expired, and that you have a spare inhaler.  SEEK MEDICAL CARE IF:   Your symptoms are only partially relieved with your inhaler.  You are having trouble using your inhaler.  You have some increase in phlegm. SEEK IMMEDIATE MEDICAL CARE IF:   You feel little or no relief with your inhalers. You are still wheezing and are feeling shortness of breath or tightness in your chest or both.  You have dizziness, headaches, or a fast heart rate.  You have chills, fever, or night sweats.  You have a noticeable increase in phlegm production, or there is blood in the phlegm. MAKE SURE YOU:   Understand these instructions.  Will watch your condition.  Will get help right away if you are not doing well or get worse. Document Released: 10/17/2000 Document Revised: 08/10/2013 Document Reviewed: 05/19/2013 [facility] Patient Information 2015 [place], [place]. This information is not intended to replace advice given to you by your health care provider. Make sure you discuss any questions you have with your health care provider.

## 2015-09-28 ENCOUNTER — Ambulatory Visit (INDEPENDENT_AMBULATORY_CARE_PROVIDER_SITE_OTHER): Payer: BLUE CROSS/BLUE SHIELD

## 2015-09-28 ENCOUNTER — Ambulatory Visit
Admission: EM | Admit: 2015-09-28 | Discharge: 2015-09-28 | Disposition: A | Discharge status: Home / Self Care | Payer: BLUE CROSS/BLUE SHIELD | Attending: Family Medicine | Admitting: Family Medicine

## 2015-09-28 DIAGNOSIS — S60011A Contusion of right thumb without damage to nail, initial encounter: Secondary | ICD-10-CM | POA: Diagnosis not present

## 2015-09-28 NOTE — Discharge Instructions (Signed)
° ° °  Splint for comfort Ibuprofen/tylenol Ice/heat Have PCP re-evaluate at 12/12 visit- Take disc and report Return to [place] if you have questions or concerns....  Contusion A contusion is a deep bruise. Contusions are the result of a blunt injury to tissues and muscle fibers under the skin. The injury causes bleeding under the skin. The skin overlying the contusion may turn blue, purple, or yellow. Minor injuries will give you a painless contusion, but more severe contusions may stay painful and swollen for a few weeks.  CAUSES  This condition is usually caused by a blow, trauma, or direct force to an area of the body. SYMPTOMS  Symptoms of this condition include:  Swelling of the injured area.  Pain and tenderness in the injured area.  Discoloration. The area may have redness and then turn blue, purple, or yellow. DIAGNOSIS  This condition is diagnosed based on a physical exam and medical history. An X-ray, CT scan, or MRI may be needed to determine if there are any associated injuries, such as broken bones (fractures). TREATMENT  Specific treatment for this condition depends on what area of the body was injured. In general, the best treatment for a contusion is resting, icing, applying pressure to (compression), and elevating the injured area. This is often called the RICE strategy. Over-the-counter anti-inflammatory medicines may also be recommended for pain control.  HOME CARE INSTRUCTIONS   Rest the injured area.  If directed, apply ice to the injured area:  Put ice in a plastic bag.  Place a towel between your skin and the bag.  Leave the ice on for 20 minutes, 2-3 times per day.  If directed, apply light compression to the injured area using an elastic bandage. Make sure the bandage is not wrapped too tightly. Remove and reapply the bandage as directed by your health care provider.  If possible, raise (elevate) the injured area above the level of your heart while you are  sitting or lying down.  Take over-the-counter and prescription medicines only as told by your health care provider. SEEK MEDICAL CARE IF:  Your symptoms do not improve after several days of treatment.  Your symptoms get worse.  You have difficulty moving the injured area. SEEK IMMEDIATE MEDICAL CARE IF:   You have severe pain.  You have numbness in a hand or foot.  Your hand or foot turns pale or cold.   This information is not intended to replace advice given to you by your health care provider. Make sure you discuss any questions you have with your health care provider.   Document Released: 07/30/2005 Document Revised: 07/11/2015 Document Reviewed: 03/07/2015 [employer] Interactive Patient Education [employer].

## 2015-09-28 NOTE — ED Provider Notes (Signed)
CSN: [id number]     Arrival date & time 09/28/15  1353 History   First MD Initiated Contact with Patient 09/28/15 1515     Chief Complaint  Patient presents with  . Finger Injury   (Consider location/radiation/quality/duration/timing/severity/associated sxs/prior Treatment) HPI  54 yo F  Closed right thumb in front load dryer door on Wednesday ( 2 days PTA) and is still having discomfort Can flex and approximate paper grip . Wearing an straight aluminum splint she had at home.  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 Concerned because it is still uncomfortable.  She took no pain meds today Past Medical History  Diagnosis Date  . Asthma   . Migraines   . Seasonal allergies   . IBS (irritable bowel syndrome)   . GERD (gastroesophageal reflux disease)    Past Surgical History  Procedure Laterality Date  . Tonsillectomy    . Cholecystectomy    . Abdominal hysterectomy     Family History  Problem Relation Age of Onset  . Heart attack Father    Social History  Substance Use Topics  . Smoking status: Never Smoker   . Smokeless tobacco: None  . Alcohol Use: 0.0 oz/week    0 Standard drinks or equivalent per week     Comment: occasional   OB History    No data available     Review of Systems 10 Systems reviewed and are negative for acute change except as noted in the HPI.  Allergies  Compazine  Home Medications   Prior to Admission medications   Medication Sig Start Date End Date Taking? Authorizing Provider  albuterol (PROVENTIL HFA;VENTOLIN HFA) 108 (90 BASE) MCG/ACT inhaler Inhale 4-6 puffs by mouth every 4 hours as needed for wheezing, cough, and/or shortness of breath 07/23/15  Yes Hinda Kehr, MD  atorvastatin (LIPITOR) 10 MG tablet Take 10 mg by mouth daily.   Yes Historical Provider, MD  benzonatate (TESSALON) 100 MG capsule Take by mouth 3 (three) times daily as needed for cough.   Yes Historical Provider, MD  Budesonide (PULMICORT  FLEXHALER) 90 MCG/ACT inhaler Inhale 1 puff into the lungs 2 (two) times daily. 07/23/15  Yes Hinda Kehr, MD  carisoprodol (SOMA) 350 MG tablet Take 350 mg by mouth 3 (three) times daily between meals as needed for muscle spasms.   Yes Historical Provider, MD  celecoxib (CELEBREX) 200 MG capsule Take 200 mg by mouth daily.   Yes Historical Provider, MD  cholecalciferol (VITAMIN D) 1000 UNITS tablet Take 2,000 Units by mouth daily.   Yes Historical Provider, MD  famotidine (PEPCID) 10 MG tablet Take 10 mg by mouth 2 (two) times daily.   Yes Historical Provider, MD  hydrochlorothiazide (HYDRODIURIL) 25 MG tablet Take 25 mg by mouth daily.   Yes Historical Provider, MD  hyoscyamine (LEVBID) 0.375 MG 12 hr tablet Take 0.375 mg by mouth 2 (two) times daily.   Yes Historical Provider, MD  montelukast (SINGULAIR) 10 MG tablet Take 10 mg by mouth at bedtime.   Yes Historical Provider, MD  buPROPion (WELLBUTRIN XL) 150 MG 24 hr tablet Take 150 mg by mouth daily.    Historical Provider, MD  diclofenac sodium (VOLTAREN) 1 % GEL Apply 2 g topically 4 (four) times daily. 05/29/15   Jan Fireman, PA-C  escitalopram (LEXAPRO) 5 MG tablet Take 5 mg by mouth daily.    Historical Provider, MD  hydrochlorothiazide (MICROZIDE) 12.5 MG capsule Take 12.5 mg by mouth daily.  Historical Provider, MD  HYDROcodone-acetaminophen (NORCO/VICODIN) 5-325 MG per tablet Take 1 tablet by mouth every 4 (four) hours as needed for moderate pain. 05/19/15   Victorino Dike, FNP  levofloxacin (LEVAQUIN) 500 MG tablet Take 1 tablet (500 mg total) by mouth daily. 05/16/15   Paulina Fusi, MD  omeprazole (PRILOSEC) 20 MG capsule Take 20 mg by mouth daily.    Historical Provider, MD  predniSONE (DELTASONE) 20 MG tablet Take 3 tablets (60 mg total) by mouth daily. 07/23/15   Hinda Kehr, MD  sucralfate (CARAFATE) 1 G tablet Take 1 g by mouth 4 (four) times daily -  with meals and at bedtime.    Historical Provider, MD  topiramate (TOPAMAX) 25  MG capsule Take 25 mg by mouth 2 (two) times daily.    Historical Provider, MD   Meds Ordered and Administered this Visit  Medications - No data to display  BP 138/83 mmHg  Pulse 94  Temp(Src) 98 F (36.7 C) (Tympanic)  Resp 16  Ht 5\' 1"  (1.549 m)  Wt 214 lb (97.07 kg)  BMI 40.46 kg/m2  SpO2 99% No data found.   Physical Exam   VS noted, WNL  GENERAL : NAD; right handed female HEENT: artraumatic, normocephalic RESP: CTA  B , no wheezing, no accessory muscle use CARD: RRR ABD: Not distended NEURO: Good attention, good recall, no gross neuro defecit PSYCH: speech and behavior appropriate MSK- right thumb with mild ecchymosis over dorsum DIP , can flex and extend but reports discomfort, Tender to pressure, good cap fill nail. Base of thumb is mildly tender to ROM but not swollen, no bruising  and she is unaware of it being involved in the door pressure  ED Course  Procedures (including critical care time)  Labs Review Labs Reviewed - No data to display  Imaging Review Dg Finger Thumb Right  09/28/2015  CLINICAL DATA:  Slammed right thumb and dryer door 2 days ago. Pain and swelling DIP joint. EXAM: RIGHT THUMB 2+V COMPARISON:  None. FINDINGS: Small fragment along the volar base of the distal phalanx likely chronic, although cannot exclude an acute chip fracture. Remainder of the exam is within normal. IMPRESSION: Tiny fragment along the volar base of the first distal phalanx likely chronic, although cannot exclude an acute chip fracture. Electronically Signed   By: Marin Olp M.D.   On: 09/28/2015 15:36  I personally reviewed the films and then reviewed with patient and has has no memory of trauma to area commented on by Xray. Will follow up with PCP at already scheduled appointment in 2 weeks- or report to Ortho in interim if she has concerns. Placed in slightly flexed aluminum hug splint for use during active day- may remove to bathe and sleep Splinted body part was  neurovascularly unchanged following the procedure. FU with [place] PRN  MDM   1. Contusion, thumb, right, initial encounter    Plan: Test/x-ray results and diagnosis reviewed with patient  Recommend supportive treatment with cyclic tylenol and ibuprofen, hug splint with increased free activity as tolerated Seek additional medical care if symptoms worsen or are not improving Questions fielded, expectations and recommendations reviewed. Discussed follow up and return parameters including no resolution or any worsening condition..  Patient expresses understanding  and agrees to plan. Will return to [facility] with questions, concerns or exacerbation.     Jan Fireman, PA-C 09/28/15 [phone]

## 2015-09-28 NOTE — ED Notes (Signed)
States caught right thumb in dryer door on Wednesday. States "hurts bad"

## 2015-10-18 ENCOUNTER — Other Ambulatory Visit: Payer: Self-pay | Admitting: Family Medicine

## 2015-10-18 DIAGNOSIS — Z1231 Encounter for screening mammogram for malignant neoplasm of breast: Secondary | ICD-10-CM

## 2015-10-31 ENCOUNTER — Ambulatory Visit
Admission: RE | Admit: 2015-10-31 | Discharge: 2015-10-31 | Disposition: A | Discharge status: Home / Self Care | Payer: BLUE CROSS/BLUE SHIELD | Source: Ambulatory Visit | Attending: Family Medicine | Admitting: Family Medicine

## 2015-10-31 DIAGNOSIS — Z1231 Encounter for screening mammogram for malignant neoplasm of breast: Secondary | ICD-10-CM | POA: Diagnosis not present

## 2016-01-19 ENCOUNTER — Emergency Department: Payer: BLUE CROSS/BLUE SHIELD

## 2016-01-19 ENCOUNTER — Emergency Department
Admission: EM | Admit: 2016-01-19 | Discharge: 2016-01-19 | Disposition: A | Discharge status: Home / Self Care | Payer: BLUE CROSS/BLUE SHIELD | Attending: Emergency Medicine | Admitting: Emergency Medicine

## 2016-01-19 ENCOUNTER — Encounter: Payer: Self-pay | Admitting: *Deleted

## 2016-01-19 DIAGNOSIS — Z791 Long term (current) use of non-steroidal anti-inflammatories (NSAID): Secondary | ICD-10-CM | POA: Insufficient documentation

## 2016-01-19 DIAGNOSIS — Z792 Long term (current) use of antibiotics: Secondary | ICD-10-CM | POA: Insufficient documentation

## 2016-01-19 DIAGNOSIS — Z7951 Long term (current) use of inhaled steroids: Secondary | ICD-10-CM | POA: Diagnosis not present

## 2016-01-19 DIAGNOSIS — Z79899 Other long term (current) drug therapy: Secondary | ICD-10-CM | POA: Diagnosis not present

## 2016-01-19 DIAGNOSIS — R0602 Shortness of breath: Secondary | ICD-10-CM | POA: Diagnosis present

## 2016-01-19 DIAGNOSIS — J45901 Unspecified asthma with (acute) exacerbation: Secondary | ICD-10-CM

## 2016-01-19 MED ORDER — PREDNISONE 50 MG PO TABS: 50.0000 mg | ORAL_TABLET | Freq: Every day | ORAL | Status: DC

## 2016-01-19 MED ORDER — PREDNISONE 20 MG PO TABS
60.0000 mg | ORAL_TABLET | Freq: Once | ORAL | Status: AC
  Administered 2016-01-19: 60 mg via ORAL
  Filled 2016-01-19: qty 3

## 2016-01-19 MED ORDER — IPRATROPIUM-ALBUTEROL 0.5-2.5 (3) MG/3ML IN SOLN
3.0000 mL | Freq: Once | RESPIRATORY_TRACT | Status: AC
  Administered 2016-01-19: 3 mL via RESPIRATORY_TRACT
  Filled 2016-01-19: qty 3

## 2016-01-19 NOTE — Discharge Instructions (Signed)
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
## 2016-01-19 NOTE — ED Notes (Signed)
Patient states that she feels much better after neb tx

## 2016-04-14 ENCOUNTER — Emergency Department
Admission: EM | Admit: 2016-04-14 | Discharge: 2016-04-14 | Disposition: A | Discharge status: Home / Self Care | Payer: BLUE CROSS/BLUE SHIELD | Attending: Emergency Medicine | Admitting: Emergency Medicine

## 2016-04-14 ENCOUNTER — Emergency Department: Payer: BLUE CROSS/BLUE SHIELD

## 2016-04-14 ENCOUNTER — Encounter: Payer: Self-pay | Admitting: Emergency Medicine

## 2016-04-14 DIAGNOSIS — R1031 Right lower quadrant pain: Secondary | ICD-10-CM | POA: Diagnosis not present

## 2016-04-14 DIAGNOSIS — Z7951 Long term (current) use of inhaled steroids: Secondary | ICD-10-CM | POA: Insufficient documentation

## 2016-04-14 DIAGNOSIS — J45909 Unspecified asthma, uncomplicated: Secondary | ICD-10-CM | POA: Diagnosis not present

## 2016-04-14 DIAGNOSIS — Z79899 Other long term (current) drug therapy: Secondary | ICD-10-CM | POA: Diagnosis not present

## 2016-04-14 LAB — COMPREHENSIVE METABOLIC PANEL
ALBUMIN: 4.8 g/dL (ref 3.5–5.0)
ALT: 16 U/L (ref 14–54)
ANION GAP: 10 (ref 5–15)
AST: 17 U/L (ref 15–41)
Alkaline Phosphatase: 117 U/L (ref 38–126)
BUN: 16 mg/dL (ref 6–20)
CHLORIDE: 100 mmol/L — AB (ref 101–111)
CO2: 27 mmol/L (ref 22–32)
Calcium: 9.2 mg/dL (ref 8.9–10.3)
Creatinine, Ser: 0.64 mg/dL (ref 0.44–1.00)
GFR calc Af Amer: >60 mL/min (ref >60)
GFR calc non Af Amer: >60 mL/min (ref >60)
GLUCOSE: 133 mg/dL — AB (ref 65–99)
POTASSIUM: 3.2 mmol/L — AB (ref 3.5–5.1)
SODIUM: 137 mmol/L (ref 135–145)
Total Bilirubin: 0.9 mg/dL (ref 0.3–1.2)
Total Protein: 8.1 g/dL (ref 6.5–8.1)

## 2016-04-14 LAB — CBC
HEMATOCRIT: 36.9 % (ref 35.0–47.0)
HEMOGLOBIN: 12.7 g/dL (ref 12.0–16.0)
MCH: 30.5 pg (ref 26.0–34.0)
MCHC: 34.6 g/dL (ref 32.0–36.0)
MCV: 88.1 fL (ref 80.0–100.0)
Platelets: 372 10*3/uL (ref 150–440)
RBC: 4.18 MIL/uL (ref 3.80–5.20)
RDW: 12.7 % (ref 11.5–14.5)
WBC: 13 10*3/uL — ABNORMAL HIGH (ref 3.6–11.0)

## 2016-04-14 LAB — URINALYSIS COMPLETE WITH MICROSCOPIC (ARMC ONLY)
BACTERIA UA: NONE SEEN
Bilirubin Urine: NEGATIVE
Glucose, UA: NEGATIVE mg/dL
Ketones, ur: NEGATIVE mg/dL
Leukocytes, UA: NEGATIVE
Nitrite: NEGATIVE
PH: 5 (ref 5.0–8.0)
Protein, ur: NEGATIVE mg/dL
SPECIFIC GRAVITY, URINE: 1.011 (ref 1.005–1.030)

## 2016-04-14 LAB — LIPASE, BLOOD: LIPASE: 27 U/L (ref 11–51)

## 2016-04-14 MED ORDER — IOPAMIDOL (ISOVUE-300) INJECTION 61%
100.0000 mL | Freq: Once | INTRAVENOUS | Status: AC | PRN
  Administered 2016-04-14: 100 mL via INTRAVENOUS

## 2016-04-14 MED ORDER — KETOROLAC TROMETHAMINE 30 MG/ML IJ SOLN
30.0000 mg | Freq: Once | INTRAMUSCULAR | Status: AC
  Administered 2016-04-14: 30 mg via INTRAVENOUS
  Filled 2016-04-14: qty 1

## 2016-04-14 MED ORDER — ONDANSETRON HCL 4 MG/2ML IJ SOLN
4.0000 mg | Freq: Once | INTRAMUSCULAR | Status: AC
  Administered 2016-04-14: 4 mg via INTRAVENOUS
  Filled 2016-04-14: qty 2

## 2016-04-14 MED ORDER — DIATRIZOATE MEGLUMINE & SODIUM 66-10 % PO SOLN
15.0000 mL | Freq: Once | ORAL | Status: AC
  Administered 2016-04-14: 15 mL via ORAL

## 2016-04-14 NOTE — Discharge Instructions (Signed)

## 2016-04-14 NOTE — ED Notes (Signed)
Patient reports right flank pain, nausea, vomiting and diarrhea for 2 weeks. Patient with complaint of pain with bowel movements. Patient reports that the pain has become worse in the last 2 days.

## 2016-04-14 NOTE — ED Provider Notes (Signed)
[facility] Emergency Department Provider Note   ____________________________________________  Time seen: Approximately [id number] AM  I have reviewed the triage vital signs and the nursing notes.   HISTORY  Chief Complaint Flank Pain; Emesis; and Diarrhea    HPI Diane Cox is a 55 y.o. female comes into the hospital today with some right-sided low back pain. She thought initially that it might be a kidney stone reports that whenever she had a bowel movement the pain radiated to her lower right abdomen. She reports that the pain would ease off with bowel movement but then it would return. She reports that she's had some diarrhea as well over the past week that worsened over the past 48 hours. He is also had some vomiting when her pain was at is worse. The patient reports today she has some nausea and worse pain with eating.The patient reports that she has not checked her temperature but she felt flushed with chills today. The patient did have some shortness of breath for which she used her albuterol inhaler but does not think it was an asthma attack. She reports that her stool is brown and watery in getting more water each day and that her emesis was just the food sheet. The patient denies any sick contacts. She is here for evaluation as she is concerned about her appendix.   Past Medical History  Diagnosis Date  . Asthma   . Migraines   . Seasonal allergies   . IBS (irritable bowel syndrome)   . GERD (gastroesophageal reflux disease)     There are no active problems to display for this patient.   Past Surgical History  Procedure Laterality Date  . Tonsillectomy    . Cholecystectomy    . Abdominal hysterectomy      Current Outpatient Rx  Name  Route  Sig  Dispense  Refill  . albuterol (PROVENTIL HFA;VENTOLIN HFA) 108 (90 BASE) MCG/ACT inhaler      Inhale 4-6 puffs by mouth every 4 hours as needed for wheezing, cough, and/or shortness of breath   1  Inhaler   1   . atorvastatin (LIPITOR) 10 MG tablet   Oral   Take 20 mg by mouth daily.          . benzonatate (TESSALON) 100 MG capsule   Oral   Take by mouth 3 (three) times daily as needed for cough.         . Budesonide (PULMICORT FLEXHALER) 90 MCG/ACT inhaler   Inhalation   Inhale 1 puff into the lungs 2 (two) times daily.   1 Inhaler   0   . carisoprodol (SOMA) 350 MG tablet   Oral   Take 350 mg by mouth 3 (three) times daily between meals as needed for muscle spasms.         . celecoxib (CELEBREX) 200 MG capsule   Oral   Take 200 mg by mouth daily.         . cholecalciferol (VITAMIN D) 1000 UNITS tablet   Oral   Take 2,000 Units by mouth daily.         . diclofenac sodium (VOLTAREN) 1 % GEL   Topical   Apply 2 g topically 4 (four) times daily.   100 g   0   . escitalopram (LEXAPRO) 5 MG tablet   Oral   Take 5 mg by mouth daily.         . famotidine (PEPCID) 10 MG tablet  Oral   Take 10 mg by mouth 2 (two) times daily.         Marland Kitchen FIBER, CORN DEXTRIN, PO   Oral   Take 1 tablet by mouth daily.         . hydrochlorothiazide (MICROZIDE) 12.5 MG capsule   Oral   Take 12.5 mg by mouth daily.         . hyoscyamine (LEVBID) 0.375 MG 12 hr tablet   Oral   Take 0.375 mg by mouth 2 (two) times daily.         . montelukast (SINGULAIR) 10 MG tablet   Oral   Take 10 mg by mouth at bedtime.         Marland Kitchen omeprazole (PRILOSEC) 20 MG capsule   Oral   Take 20 mg by mouth daily.         . Probiotic Product (PROBIOTIC DAILY PO)   Oral   Take 1 capsule by mouth daily.         . sucralfate (CARAFATE) 1 G tablet   Oral   Take 1 g by mouth 4 (four) times daily -  with meals and at bedtime.         . topiramate (TOPAMAX) 25 MG capsule   Oral   Take 25 mg by mouth 2 (two) times daily.           Allergies Compazine  Family History  Problem Relation Age of Onset  . Heart attack Father   . Breast cancer Maternal Aunt     Social  History Social History  Substance Use Topics  . Smoking status: Never Smoker   . Smokeless tobacco: None  . Alcohol Use: 0.0 oz/week    0 Standard drinks or equivalent per week     Comment: occasional    Review of Systems Constitutional: No fever/chills Eyes: No visual changes. ENT: No sore throat. Cardiovascular: Denies chest pain. Respiratory: Denies shortness of breath. Gastrointestinal:  abdominal pain, nausea, vomiting, diarrhea.  No constipation. Genitourinary: Negative for dysuria. Musculoskeletal:  back pain. Skin: Negative for rash. Neurological: Negative for headaches, focal weakness or numbness.  10-point ROS otherwise negative.  ____________________________________________   PHYSICAL EXAM:  VITAL SIGNS: ED Triage Vitals  Enc Vitals Group     BP 04/14/16 0311 159/81 mmHg     Pulse Rate 04/14/16 0311 77     Resp 04/14/16 0311 18     Temp 04/14/16 0311 98.6 F (37 C)     Temp Source 04/14/16 0311 Oral     SpO2 04/14/16 0311 100 %     Weight 04/14/16 0311 220 lb (99.791 kg)     Height 04/14/16 0311 5\' 1"  (1.549 m)     Head Cir --      Peak Flow --      Pain Score 04/14/16 0311 8     Pain Loc --      Pain Edu? --      Excl. in [place]? --     Constitutional: Alert and oriented. Well appearing and in moderate distress. Eyes: Conjunctivae are normal. PERRL. EOMI. Head: Atraumatic. Nose: No congestion/rhinnorhea. Mouth/Throat: Mucous membranes are moist.  Oropharynx non-erythematous. Cardiovascular: Normal rate, regular rhythm. Grossly normal heart sounds.  Good peripheral circulation. Respiratory: Normal respiratory effort.  No retractions. Lungs CTAB. Gastrointestinal: Soft with RLQ pain to palpation. No distention. Positive bowel sounds Musculoskeletal: No lower extremity tenderness nor edema.   Neurologic:  Normal speech and language. Skin:  Skin is warm,  dry and intact.  Psychiatric: Mood and affect are normal.    ____________________________________________   LABS (all labs ordered are listed, but only abnormal results are displayed)  Labs Reviewed  COMPREHENSIVE METABOLIC PANEL - Abnormal; Notable for the following:    Potassium 3.2 (*)    Chloride 100 (*)    Glucose, Bld 133 (*)    All other components within normal limits  CBC - Abnormal; Notable for the following:    WBC 13.0 (*)    All other components within normal limits  URINALYSIS COMPLETEWITH MICROSCOPIC ([facility] ONLY) - Abnormal; Notable for the following:    Color, Urine YELLOW (*)    APPearance CLEAR (*)    Hgb urine dipstick 1+ (*)    Squamous Epithelial / LPF 0-5 (*)    All other components within normal limits  LIPASE, BLOOD   ____________________________________________  EKG  none ____________________________________________  RADIOLOGY  CT abd and pelvis: no acute intra-abdominal or pelvic process, normal appendix, 18 mm probable hepatic hemangioma, status post cholecystectomy and hysterectomy. ____________________________________________   PROCEDURES  Procedure(s) performed: None  Critical Care performed: No  ____________________________________________   INITIAL IMPRESSION / ASSESSMENT AND PLAN / ED COURSE  Pertinent labs & imaging results that were available during my care of the patient were reviewed by me and considered in my medical decision making (see chart for details).  This is a 55 year old female who comes into the hospital today with some right-sided abdominal and back pain. The patient has some right lower quadrant tenderness palpation but her CT scan is unremarkable. I did give the patient dose of Toradol and her pain is improved. I feel the patient should follow back up with her primary care physician for further evaluation of the symptoms. The patient had no further questions or concerns at this time and she will follow-up. ____________________________________________   FINAL CLINICAL  IMPRESSION(S) / ED DIAGNOSES  Final diagnoses:  Right lower quadrant abdominal pain      NEW MEDICATIONS STARTED DURING THIS VISIT:  New Prescriptions   No medications on file     Note:  This document was prepared using Dragon voice recognition software and may include unintentional dictation errors.    Loney Hering, MD 04/14/16 [phone]

## 2016-05-21 ENCOUNTER — Other Ambulatory Visit: Payer: Self-pay | Admitting: Gastroenterology

## 2016-05-21 DIAGNOSIS — K769 Liver disease, unspecified: Secondary | ICD-10-CM

## 2016-06-03 ENCOUNTER — Ambulatory Visit: Payer: BLUE CROSS/BLUE SHIELD

## 2016-06-03 ENCOUNTER — Ambulatory Visit
Admission: RE | Admit: 2016-06-03 | Discharge: 2016-06-03 | Disposition: A | Discharge status: Home / Self Care | Payer: BLUE CROSS/BLUE SHIELD | Source: Ambulatory Visit | Attending: Gastroenterology | Admitting: Gastroenterology

## 2016-06-03 DIAGNOSIS — K769 Liver disease, unspecified: Secondary | ICD-10-CM

## 2016-06-03 DIAGNOSIS — K7689 Other specified diseases of liver: Secondary | ICD-10-CM | POA: Insufficient documentation

## 2016-06-03 MED ORDER — GADOBENATE DIMEGLUMINE 529 MG/ML IV SOLN
20.0000 mL | Freq: Once | INTRAVENOUS | Status: AC | PRN
  Administered 2016-06-03: 20 mL via INTRAVENOUS

## 2016-07-19 ENCOUNTER — Ambulatory Visit
Admission: EM | Admit: 2016-07-19 | Discharge: 2016-07-19 | Disposition: A | Discharge status: Home / Self Care | Payer: BLUE CROSS/BLUE SHIELD | Attending: Family Medicine | Admitting: Family Medicine

## 2016-07-19 ENCOUNTER — Encounter: Payer: Self-pay | Admitting: *Deleted

## 2016-07-19 DIAGNOSIS — J069 Acute upper respiratory infection, unspecified: Secondary | ICD-10-CM

## 2016-07-19 LAB — RAPID INFLUENZA A&B ANTIGENS (ARMC ONLY)
INFLUENZA A (ARMC): NEGATIVE
INFLUENZA B (ARMC): NEGATIVE

## 2016-07-19 MED ORDER — GUAIFENESIN-CODEINE 100-10 MG/5ML PO SYRP: 5.0000 mL | ORAL_SOLUTION | Freq: Three times a day (TID) | ORAL | 0 refills | Status: AC | PRN

## 2016-07-19 NOTE — ED Triage Notes (Signed)
Patient started having symptoms of nasal congestion, cough, and fever 1 week ago. Patient has used OTC with limited resolution of symptoms. The cough is productive and thick, the patient was unable to assign a color to the congestion.

## 2016-07-19 NOTE — ED Provider Notes (Signed)
CSN: [id number]     Arrival date & time 07/19/16  1137 History   First MD Initiated Contact with Patient 07/19/16 1214     Chief Complaint  Patient presents with  . Cough  . Fever  . Nasal Congestion   (Consider location/radiation/quality/duration/timing/severity/associated sxs/prior Treatment) Diane Cox is a well-appearing 55 y.o female, presents today for 7-day history of fever, chills, fatigue, body ache, nasal congestion, shortness of breath and chest pain when she coughs. She reports cough to be productive and is thick. She was also having nausea and headache at the first few days but both headache and nausea has resolved. Patient denies sick contact at home. She have tried OTC cold medicine with no relief. She also have been taking tessalon 100mg  TID with no relief. Patient is requesting to be check for flu.     Past Medical History:  Diagnosis Date  . Asthma   . GERD (gastroesophageal reflux disease)   . IBS (irritable bowel syndrome)   . Migraines   . Seasonal allergies    Past Surgical History:  Procedure Laterality Date  . ABDOMINAL HYSTERECTOMY    . CHOLECYSTECTOMY    . TONSILLECTOMY     Family History  Problem Relation Age of Onset  . Heart attack Father   . Breast cancer Maternal Aunt    Social History  Substance Use Topics  . Smoking status: Never Smoker  . Smokeless tobacco: Never Used  . Alcohol use 0.0 oz/week     Comment: occasional   OB History    No data available     Review of Systems  Constitutional: Positive for appetite change, chills, fatigue and fever.       Don't have a thermometer at home.   HENT: Positive for congestion and sore throat. Negative for ear pain, rhinorrhea and sinus pressure.   Respiratory: Positive for cough and shortness of breath.        Cough is productive  Cardiovascular: Positive for chest pain. Negative for palpitations and leg swelling.       Cp only during coughing  Gastrointestinal: Positive for nausea. Negative  for abdominal pain, diarrhea and vomiting.       Was nauseous last week but resolved  Genitourinary: Negative for dysuria and vaginal discharge.  Musculoskeletal: Positive for myalgias. Negative for arthralgias, neck pain and neck stiffness.  Neurological: Negative for dizziness, weakness and headaches.       Was having headache but HA resolved    Allergies  Chicken allergy; Compazine [prochlorperazine edisylate]; and Pork-derived products  Home Medications   Prior to Admission medications   Medication Sig Start Date End Date Taking? Authorizing Provider  albuterol (PROVENTIL HFA;VENTOLIN HFA) 108 (90 BASE) MCG/ACT inhaler Inhale 4-6 puffs by mouth every 4 hours as needed for wheezing, cough, and/or shortness of breath 07/23/15  Yes Hinda Kehr, MD  atorvastatin (LIPITOR) 10 MG tablet Take 20 mg by mouth daily.    Yes Historical Provider, MD  benzonatate (TESSALON) 100 MG capsule Take by mouth 3 (three) times daily as needed for cough.   Yes Historical Provider, MD  Budesonide (PULMICORT FLEXHALER) 90 MCG/ACT inhaler Inhale 1 puff into the lungs 2 (two) times daily. 07/23/15  Yes Hinda Kehr, MD  carisoprodol (SOMA) 350 MG tablet Take 350 mg by mouth 3 (three) times daily between meals as needed for muscle spasms.   Yes Historical Provider, MD  celecoxib (CELEBREX) 200 MG capsule Take 200 mg by mouth daily.   Yes Historical Provider, MD  cholecalciferol (VITAMIN D) 1000 UNITS tablet Take 2,000 Units by mouth daily.   Yes Historical Provider, MD  diclofenac sodium (VOLTAREN) 1 % GEL Apply 2 g topically 4 (four) times daily. 05/29/15  Yes Jan Fireman, PA-C  escitalopram (LEXAPRO) 5 MG tablet Take 10 mg by mouth daily.    Yes Historical Provider, MD  famotidine (PEPCID) 10 MG tablet Take 10 mg by mouth 2 (two) times daily.   Yes Historical Provider, MD  FIBER, CORN DEXTRIN, PO Take 1 tablet by mouth daily.   Yes Historical Provider, MD  hydrochlorothiazide (MICROZIDE) 12.5 MG capsule Take 12.5  mg by mouth daily.   Yes Historical Provider, MD  hyoscyamine (LEVBID) 0.375 MG 12 hr tablet Take 0.375 mg by mouth 2 (two) times daily.   Yes Historical Provider, MD  montelukast (SINGULAIR) 10 MG tablet Take 10 mg by mouth at bedtime.   Yes Historical Provider, MD  omeprazole (PRILOSEC) 20 MG capsule Take 20 mg by mouth daily.   Yes Historical Provider, MD  Probiotic Product (PROBIOTIC DAILY PO) Take 1 capsule by mouth daily.   Yes Historical Provider, MD  sucralfate (CARAFATE) 1 G tablet Take 1 g by mouth 4 (four) times daily -  with meals and at bedtime.   Yes Historical Provider, MD  topiramate (TOPAMAX) 25 MG capsule Take 25 mg by mouth 2 (two) times daily.   Yes Historical Provider, MD  guaiFENesin-codeine (ROBITUSSIN AC) 100-10 MG/5ML syrup Take 5 mLs by mouth 3 (three) times daily as needed for cough. 07/19/16 07/24/16  Barry Dienes, NP   Meds Ordered and Administered this Visit  Medications - No data to display  BP 130/84 (BP Location: Left Arm)   Pulse 93   Temp 97.7 F (36.5 C) (Oral)   Resp 18   Ht 5\' 1"  (1.549 m)   Wt 210 lb (95.3 kg)   SpO2 97%   BMI 39.68 kg/m  No data found.   Physical Exam  Urgent Care Course   Clinical Course    Procedures (including critical care time)  Labs Review Labs Reviewed  RAPID INFLUENZA A&B ANTIGENS ([place])    Imaging Review No results found.      MDM   1. URI (upper respiratory infection)    The patient's signs and symptoms are most consistent with a diagnosis of URI. Flu was negative. Patient educated that antibiotic would not be helpful since this is a viral illness. Treatment is supportive. Patient encouraged to rest, drink plenty of fluid, use Motrin/Tylenol as needed at appropriate dose for pain/fever.    Barry Dienes, NP 07/19/16 [phone]

## 2016-07-27 ENCOUNTER — Emergency Department: Payer: BLUE CROSS/BLUE SHIELD

## 2016-07-27 ENCOUNTER — Emergency Department
Admission: EM | Admit: 2016-07-27 | Discharge: 2016-07-27 | Disposition: A | Discharge status: Home / Self Care | Payer: BLUE CROSS/BLUE SHIELD | Attending: Emergency Medicine | Admitting: Emergency Medicine

## 2016-07-27 DIAGNOSIS — J45909 Unspecified asthma, uncomplicated: Secondary | ICD-10-CM | POA: Insufficient documentation

## 2016-07-27 DIAGNOSIS — J157 Pneumonia due to Mycoplasma pneumoniae: Secondary | ICD-10-CM | POA: Insufficient documentation

## 2016-07-27 DIAGNOSIS — J029 Acute pharyngitis, unspecified: Secondary | ICD-10-CM | POA: Diagnosis present

## 2016-07-27 DIAGNOSIS — Z79899 Other long term (current) drug therapy: Secondary | ICD-10-CM | POA: Insufficient documentation

## 2016-07-27 MED ORDER — IPRATROPIUM-ALBUTEROL 0.5-2.5 (3) MG/3ML IN SOLN
3.0000 mL | Freq: Once | RESPIRATORY_TRACT | Status: AC
  Administered 2016-07-27: 3 mL via RESPIRATORY_TRACT
  Filled 2016-07-27: qty 3

## 2016-07-27 MED ORDER — PREDNISONE 50 MG PO TABS
50.0000 mg | ORAL_TABLET | Freq: Every day | ORAL | 0 refills | Status: DC
Start: 2016-07-27 — End: 2018-11-05

## 2016-07-27 MED ORDER — PSEUDOEPH-BROMPHEN-DM 30-2-10 MG/5ML PO SYRP: 10.0000 mL | ORAL_SOLUTION | Freq: Four times a day (QID) | ORAL | 0 refills | Status: DC | PRN

## 2016-07-27 MED ORDER — METHYLPREDNISOLONE SODIUM SUCC 125 MG IJ SOLR
125.0000 mg | Freq: Once | INTRAMUSCULAR | Status: AC
  Administered 2016-07-27: 125 mg via INTRAMUSCULAR
  Filled 2016-07-27: qty 2

## 2016-07-27 MED ORDER — AZITHROMYCIN 250 MG PO TABS: ORAL_TABLET | ORAL | 0 refills | Status: DC

## 2016-07-27 NOTE — ED Triage Notes (Signed)
Patient reports symptoms for approximately 3 weeks.  With cough, sore throat.  Patient reports seen at [facility] and diagnosed with viral infection.  Reports no better.

## 2016-07-27 NOTE — ED Notes (Signed)
In to give pt ordered medications; she has gone to radiology for xrays

## 2016-07-27 NOTE — ED Provider Notes (Signed)
[facility] Emergency Department Provider Note  ____________________________________________  Time seen: Approximately 8:00 PM  I have reviewed the triage vital signs and the nursing notes.   HISTORY  Chief Complaint Cough and Sore Throat    HPI Diane Cox is a 55 y.o. female who presents emergency department complaining of 3 week history of nasal congestion, sore throat, coughing. Patient was seen at [facility] 2 weeks prior and diagnosed with viral infection. Patient states that symptoms have not improved. Patient does have underlying asthma and states that she has been using daily medications as well as her rescue inhaler with no improvement. Patient states that she has had a couple coughing spells that are resulted with some minor blood in her sputum. She denies any significant amounts of blood. Patient denies any fevers or chills, chest pain, short of breath, difficulty breathing.   Past Medical History:  Diagnosis Date  . Asthma   . GERD (gastroesophageal reflux disease)   . IBS (irritable bowel syndrome)   . Migraines   . Seasonal allergies     There are no active problems to display for this patient.   Past Surgical History:  Procedure Laterality Date  . ABDOMINAL HYSTERECTOMY    . CHOLECYSTECTOMY    . TONSILLECTOMY      Prior to Admission medications   Medication Sig Start Date End Date Taking? Authorizing Provider  albuterol (PROVENTIL HFA;VENTOLIN HFA) 108 (90 BASE) MCG/ACT inhaler Inhale 4-6 puffs by mouth every 4 hours as needed for wheezing, cough, and/or shortness of breath 07/23/15   Hinda Kehr, MD  atorvastatin (LIPITOR) 10 MG tablet Take 20 mg by mouth daily.     Historical Provider, MD  azithromycin (ZITHROMAX Z-PAK) 250 MG tablet Take 2 tablets (500 mg) on  Day 1,  followed by 1 tablet (250 mg) once daily on Days 2 through 5. 07/27/16   Roderic Palau D Cuthriell, PA-C  benzonatate (TESSALON) 100 MG capsule Take by mouth 3  (three) times daily as needed for cough.    Historical Provider, MD  brompheniramine-pseudoephedrine-DM 30-2-10 MG/5ML syrup Take 10 mLs by mouth 4 (four) times daily as needed. 07/27/16   Charline Bills Cuthriell, PA-C  Budesonide (PULMICORT FLEXHALER) 90 MCG/ACT inhaler Inhale 1 puff into the lungs 2 (two) times daily. 07/23/15   Hinda Kehr, MD  carisoprodol (SOMA) 350 MG tablet Take 350 mg by mouth 3 (three) times daily between meals as needed for muscle spasms.    Historical Provider, MD  celecoxib (CELEBREX) 200 MG capsule Take 200 mg by mouth daily.    Historical Provider, MD  cholecalciferol (VITAMIN D) 1000 UNITS tablet Take 2,000 Units by mouth daily.    Historical Provider, MD  diclofenac sodium (VOLTAREN) 1 % GEL Apply 2 g topically 4 (four) times daily. 05/29/15   Jan Fireman, PA-C  escitalopram (LEXAPRO) 5 MG tablet Take 10 mg by mouth daily.     Historical Provider, MD  famotidine (PEPCID) 10 MG tablet Take 10 mg by mouth 2 (two) times daily.    Historical Provider, MD  FIBER, CORN DEXTRIN, PO Take 1 tablet by mouth daily.    Historical Provider, MD  hydrochlorothiazide (MICROZIDE) 12.5 MG capsule Take 12.5 mg by mouth daily.    Historical Provider, MD  hyoscyamine (LEVBID) 0.375 MG 12 hr tablet Take 0.375 mg by mouth 2 (two) times daily.    Historical Provider, MD  montelukast (SINGULAIR) 10 MG tablet Take 10 mg by mouth at bedtime.  Historical Provider, MD  omeprazole (PRILOSEC) 20 MG capsule Take 20 mg by mouth daily.    Historical Provider, MD  predniSONE (DELTASONE) 50 MG tablet Take 1 tablet (50 mg total) by mouth daily with breakfast. 07/27/16   Charline Bills Cuthriell, PA-C  Probiotic Product (PROBIOTIC DAILY PO) Take 1 capsule by mouth daily.    Historical Provider, MD  sucralfate (CARAFATE) 1 G tablet Take 1 g by mouth 4 (four) times daily -  with meals and at bedtime.    Historical Provider, MD  topiramate (TOPAMAX) 25 MG capsule Take 25 mg by mouth 2 (two) times daily.     Historical Provider, MD    Allergies Chicken allergy; Compazine [prochlorperazine edisylate]; and Pork-derived products  Family History  Problem Relation Age of Onset  . Heart attack Father   . Breast cancer Maternal Aunt     Social History Social History  Substance Use Topics  . Smoking status: Never Smoker  . Smokeless tobacco: Never Used  . Alcohol use 0.0 oz/week     Comment: occasional     Review of Systems  Constitutional: No fever/chills Eyes: No visual changes. No discharge ENT: As in for nasal congestion and sore throat. Cardiovascular: no chest pain. Respiratory: As above for cough. No SOB. Gastrointestinal: No abdominal pain.  No nausea, no vomiting.   Musculoskeletal: Negative for musculoskeletal pain. Skin: Negative for rash, abrasions, lacerations, ecchymosis. Neurological: Negative for headaches, focal weakness or numbness. 10-point ROS otherwise negative.  ____________________________________________   PHYSICAL EXAM:  VITAL SIGNS: ED Triage Vitals [07/27/16 1918]  Enc Vitals Group     BP (!) 159/76     Pulse Rate (!) 109     Resp (!) 22     Temp 98.4 F (36.9 C)     Temp Source Oral     SpO2 99 %     Weight      Height      Head Circumference      Peak Flow      Pain Score      Pain Loc      Pain Edu?      Excl. in [place]?      Constitutional: Alert and oriented. Well appearing and in no acute distress. Eyes: Conjunctivae are normal. PERRL. EOMI. Head: Atraumatic. ENT:      Ears: EACs and TMs are unremarkable bilaterally.      Nose: Mild congestion/rhinnorhea. Turbinates are boggy.      Mouth/Throat: Mucous membranes are moist. Oropharynx is mildly erythematous but not edematous. Uvula is midline. Tonsils are unremarkable. Neck: No stridor.   Hematological/Lymphatic/Immunilogical: Diffuse anterior cervical lymphadenopathy. Cardiovascular: Normal rate, regular rhythm. Normal S1 and S2.  Good peripheral circulation. Respiratory: Normal  respiratory effort without tachypnea or retractions. Lungs with scattered minor wheezing and coarse breath sounds.Kermit Balo air entry to the bases with no decreased or absent breath sounds. Musculoskeletal: Full range of motion to all extremities. No gross deformities appreciated. Neurologic:  Normal speech and language. No gross focal neurologic deficits are appreciated.  Skin:  Skin is warm, dry and intact. No rash noted. Psychiatric: Mood and affect are normal. Speech and behavior are normal. Patient exhibits appropriate insight and judgement.   ____________________________________________   LABS (all labs ordered are listed, but only abnormal results are displayed)  Labs Reviewed - No data to display ____________________________________________  EKG   ____________________________________________  RADIOLOGY Diamantina Providence Cuthriell, personally viewed and evaluated these images (plain radiographs) as part of my medical  decision making, as well as reviewing the written report by the radiologist.  Dg Chest 2 View  Result Date: 07/27/2016 CLINICAL DATA:  Subacute onset of productive cough and sore throat. Initial encounter. EXAM: CHEST  2 VIEW COMPARISON:  Chest radiograph performed 01/19/2016 FINDINGS: The lungs are well-aerated and clear. There is no evidence of focal opacification, pleural effusion or pneumothorax. The heart is borderline normal in size. No acute osseous abnormalities are seen. Clips are noted within the right upper quadrant, reflecting prior cholecystectomy. IMPRESSION: No acute cardiopulmonary process seen. Electronically Signed   By: Garald Balding M.D.   On: 07/27/2016 20:24    ____________________________________________    PROCEDURES  Procedure(s) performed:    Procedures    Medications  ipratropium-albuterol (DUONEB) 0.5-2.5 (3) MG/3ML nebulizer solution 3 mL (3 mLs Nebulization Given 07/27/16 2026)  methylPREDNISolone sodium succinate (SOLU-MEDROL) 125  mg/2 mL injection 125 mg (125 mg Intramuscular Given 07/27/16 2024)     ____________________________________________   INITIAL IMPRESSION / ASSESSMENT AND PLAN / ED COURSE  Pertinent labs & imaging results that were available during my care of the patient were reviewed by me and considered in my medical decision making (see chart for details).  Review of the [place] CSRS was performed in accordance of the [place] prior to dispensing any controlled drugs.  Clinical Course    Patient's diagnosis is consistent with Mycoplasma pneumonia. Patient is had symptoms ongoing over 3 weeks. X-ray reveals no acute cardiopulmonary abnormality. Diffuse scattered wheezes and coarse breath sounds. Patient was given injection of steroids and DuoNeb treatment in the emergency department with some relief of symptoms. Patient is to continue using at home has been medications as well as adding steroids and oral antibiotics at this time. Patient has allergy medications for which she will use to control allergic rhinitis and postnasal drip symptoms.. Patient will be discharged home with prescriptions for short course of steroids and antibiotics. Patient is to follow up with primary care as needed or otherwise directed. Patient is given ED precautions to return to the ED for any worsening or new symptoms.     ____________________________________________  FINAL CLINICAL IMPRESSION(S) / ED DIAGNOSES  Final diagnoses:  Mycoplasma pneumonia      NEW MEDICATIONS STARTED DURING THIS VISIT:  New Prescriptions   AZITHROMYCIN (ZITHROMAX Z-PAK) 250 MG TABLET    Take 2 tablets (500 mg) on  Day 1,  followed by 1 tablet (250 mg) once daily on Days 2 through 5.   BROMPHENIRAMINE-PSEUDOEPHEDRINE-DM 30-2-10 MG/5ML SYRUP    Take 10 mLs by mouth 4 (four) times daily as needed.   PREDNISONE (DELTASONE) 50 MG TABLET    Take 1 tablet (50 mg total) by mouth daily with breakfast.        This chart was dictated using voice  recognition software/Dragon. Despite best efforts to proofread, errors can occur which can change the meaning. Any change was purely unintentional.    Darletta Moll, PA-C 07/27/16 2057    Nance Pear, MD 07/27/16 2328

## 2016-07-27 NOTE — ED Notes (Signed)
Pt has returned from xray

## 2016-10-21 DIAGNOSIS — K589 Irritable bowel syndrome without diarrhea: Secondary | ICD-10-CM | POA: Insufficient documentation

## 2016-10-21 DIAGNOSIS — M1990 Unspecified osteoarthritis, unspecified site: Secondary | ICD-10-CM | POA: Insufficient documentation

## 2016-11-24 ENCOUNTER — Encounter: Payer: Self-pay | Admitting: *Deleted

## 2016-11-25 ENCOUNTER — Ambulatory Visit
Admission: RE | Admit: 2016-11-25 | Discharge: 2016-11-25 | Disposition: A | Discharge status: Home / Self Care | Payer: BLUE CROSS/BLUE SHIELD | Source: Ambulatory Visit | Attending: Gastroenterology | Admitting: Gastroenterology

## 2016-11-25 ENCOUNTER — Encounter
Admission: RE | Disposition: A | Discharge status: Home / Self Care | Payer: Self-pay | Source: Ambulatory Visit | Attending: Gastroenterology

## 2016-11-25 ENCOUNTER — Ambulatory Visit: Payer: BLUE CROSS/BLUE SHIELD | Admitting: Anesthesiology

## 2016-11-25 DIAGNOSIS — K319 Disease of stomach and duodenum, unspecified: Secondary | ICD-10-CM | POA: Diagnosis not present

## 2016-11-25 DIAGNOSIS — R1030 Lower abdominal pain, unspecified: Secondary | ICD-10-CM | POA: Diagnosis not present

## 2016-11-25 DIAGNOSIS — J45909 Unspecified asthma, uncomplicated: Secondary | ICD-10-CM | POA: Diagnosis not present

## 2016-11-25 DIAGNOSIS — K2950 Unspecified chronic gastritis without bleeding: Secondary | ICD-10-CM | POA: Diagnosis not present

## 2016-11-25 DIAGNOSIS — G43909 Migraine, unspecified, not intractable, without status migrainosus: Secondary | ICD-10-CM | POA: Diagnosis not present

## 2016-11-25 DIAGNOSIS — Z6841 Body Mass Index (BMI) 40.0 and over, adult: Secondary | ICD-10-CM | POA: Diagnosis not present

## 2016-11-25 DIAGNOSIS — Z79899 Other long term (current) drug therapy: Secondary | ICD-10-CM | POA: Insufficient documentation

## 2016-11-25 DIAGNOSIS — R1013 Epigastric pain: Secondary | ICD-10-CM | POA: Insufficient documentation

## 2016-11-25 DIAGNOSIS — E669 Obesity, unspecified: Secondary | ICD-10-CM | POA: Insufficient documentation

## 2016-11-25 DIAGNOSIS — D124 Benign neoplasm of descending colon: Secondary | ICD-10-CM | POA: Insufficient documentation

## 2016-11-25 DIAGNOSIS — K219 Gastro-esophageal reflux disease without esophagitis: Secondary | ICD-10-CM | POA: Diagnosis not present

## 2016-11-25 DIAGNOSIS — K589 Irritable bowel syndrome without diarrhea: Secondary | ICD-10-CM | POA: Diagnosis not present

## 2016-11-25 DIAGNOSIS — K644 Residual hemorrhoidal skin tags: Secondary | ICD-10-CM | POA: Diagnosis not present

## 2016-11-25 DIAGNOSIS — Z791 Long term (current) use of non-steroidal anti-inflammatories (NSAID): Secondary | ICD-10-CM | POA: Diagnosis not present

## 2016-11-25 HISTORY — DX: Hyperlipidemia, unspecified: E78.5

## 2016-11-25 HISTORY — DX: Unspecified osteoarthritis, unspecified site: M19.90

## 2016-11-25 HISTORY — PX: COLONOSCOPY WITH PROPOFOL: SHX5780

## 2016-11-25 HISTORY — PX: ESOPHAGOGASTRODUODENOSCOPY (EGD) WITH PROPOFOL: SHX5813

## 2016-11-25 HISTORY — DX: Obesity, unspecified: E66.9

## 2016-11-25 HISTORY — DX: Anemia, unspecified: D64.9

## 2016-11-25 SURGERY — COLONOSCOPY WITH PROPOFOL
Anesthesia: General

## 2016-11-25 MED ORDER — PROPOFOL 500 MG/50ML IV EMUL
INTRAVENOUS | Status: DC | PRN
  Administered 2016-11-25: 160 ug/kg/min via INTRAVENOUS

## 2016-11-25 MED ORDER — SODIUM CHLORIDE 0.9 % IV SOLN
INTRAVENOUS | Status: DC
  Administered 2016-11-25: 1000 mL via INTRAVENOUS

## 2016-11-25 MED ORDER — FENTANYL CITRATE (PF) 100 MCG/2ML IJ SOLN
INTRAMUSCULAR | Status: AC
  Filled 2016-11-25: qty 2

## 2016-11-25 MED ORDER — SODIUM CHLORIDE 0.9 % IV SOLN: INTRAVENOUS | Status: DC

## 2016-11-25 MED ORDER — PROPOFOL 10 MG/ML IV BOLUS
INTRAVENOUS | Status: DC | PRN
  Administered 2016-11-25: 100 mg via INTRAVENOUS

## 2016-11-25 MED ORDER — LIDOCAINE HCL (PF) 2 % IJ SOLN
INTRAMUSCULAR | Status: AC
  Filled 2016-11-25: qty 2

## 2016-11-25 MED ORDER — MIDAZOLAM HCL 5 MG/5ML IJ SOLN
INTRAMUSCULAR | Status: DC | PRN
  Administered 2016-11-25: 1 mg via INTRAVENOUS

## 2016-11-25 MED ORDER — PROPOFOL 500 MG/50ML IV EMUL
INTRAVENOUS | Status: AC
  Filled 2016-11-25: qty 50

## 2016-11-25 MED ORDER — MIDAZOLAM HCL 2 MG/2ML IJ SOLN
INTRAMUSCULAR | Status: AC
  Filled 2016-11-25: qty 2

## 2016-11-25 MED ORDER — LIDOCAINE 2% (20 MG/ML) 5 ML SYRINGE
INTRAMUSCULAR | Status: DC | PRN
  Administered 2016-11-25: 40 mg via INTRAVENOUS

## 2016-11-25 MED ORDER — PROPOFOL 10 MG/ML IV BOLUS
INTRAVENOUS | Status: AC
  Filled 2016-11-25: qty 20

## 2016-11-25 MED ORDER — FENTANYL CITRATE (PF) 100 MCG/2ML IJ SOLN
INTRAMUSCULAR | Status: DC | PRN
  Administered 2016-11-25: 50 ug via INTRAVENOUS

## 2016-11-25 NOTE — Op Note (Signed)
[facility] Patient Name: Diane Cox Procedure Date: 11/25/2016 7:24 AM MRN: [id number] Account #: [id number] Date of Birth: 18-Dec-1960 Admit Type: Outpatient Age: 56 Room: [facility] ENDO ROOM 3 Gender: Female Note Status: Finalized Procedure:            Colonoscopy Indications:          Lower abdominal pain Providers:            Lollie Sails, MD Referring MD:         Irven Easterly. Kary Kos, MD (Referring MD) Medicines:            Monitored Anesthesia Care Complications:        No immediate complications. Procedure:            Pre-Anesthesia Assessment:                       - ASA Grade Assessment: III - A patient with severe                        systemic disease.                       After obtaining informed consent, the colonoscope was                        passed under direct vision. Throughout the procedure,                        the patient's blood pressure, pulse, and oxygen                        saturations were monitored continuously. The                        Colonoscope was introduced through the anus and                        advanced to the the terminal ileum. The quality of the                        bowel preparation was good. The colonoscopy was                        performed without difficulty. The patient tolerated the                        procedure well. The quality of the bowel preparation                        was [Prep Quality]. Findings:      A 3 mm polyp was found in the descending colon. The polyp was sessile.       The polyp was removed with a cold biopsy forceps. Resection and       retrieval were complete.      Non-bleeding external hemorrhoids were found during perianal exam. The       hemorrhoids were small.      The retroflexed view of the distal rectum and anal verge was normal and       showed no anal or rectal abnormalities.      The exam was otherwise without abnormality.  Impression:           -  One 3 mm polyp in the descending colon, removed with                        a cold biopsy forceps. Resected and retrieved.                       - Non-bleeding external hemorrhoids.                       - The distal rectum and anal verge are normal on                        retroflexion view.                       - The examination was otherwise normal. Recommendation:       - Discharge patient to home.                       - Await pathology results.                       - Return to GI clinic in 1 month. Procedure Code(s):    --- Professional ---                       [phone], Colonoscopy, flexible; with biopsy, single or                        multiple Diagnosis Code(s):    --- Professional ---                       D12.4, Benign neoplasm of descending colon                       K64.4, Residual hemorrhoidal skin tags                       R10.30, Lower abdominal pain, unspecified CPT copyright 2016 American Medical Association. All rights reserved. The codes documented in this report are preliminary and upon coder review may  be revised to meet current compliance requirements. Lollie Sails, MD 11/25/2016 8:06:39 AM This report has been signed electronically. Number of Addenda: 0 Note Initiated On: 11/25/2016 7:24 AM Scope Withdrawal Time: 0 hours 8 minutes 57 seconds  Total Procedure Duration: 0 hours 14 minutes 9 seconds       [facility]

## 2016-11-25 NOTE — Transfer of Care (Signed)
Immediate Anesthesia Transfer of Care Note  Patient: Diane Cox  Procedure(s) Performed: Procedure(s): COLONOSCOPY WITH PROPOFOL (N/A) ESOPHAGOGASTRODUODENOSCOPY (EGD) WITH PROPOFOL (N/A)  Patient Location: PACU and Endoscopy Unit  Anesthesia Type:General  Level of Consciousness: sedated  Airway & Oxygen Therapy: Patient Spontanous Breathing and Patient connected to nasal cannula oxygen  Post-op Assessment: Report given to RN and Post -op Vital signs reviewed and stable  Post vital signs: Reviewed and stable  Last Vitals:  Vitals:   11/25/16 0653  BP: (!) 140/92  Pulse: 94  Resp: 16  Temp: 36.9 C    Last Pain:  Vitals:   11/25/16 0653  TempSrc: Tympanic         Complications: No apparent anesthesia complications

## 2016-11-25 NOTE — Anesthesia Post-op Follow-up Note (Cosign Needed)
Anesthesia QCDR form completed.        

## 2016-11-25 NOTE — Anesthesia Postprocedure Evaluation (Signed)
Anesthesia Post Note  Patient: Jmiyah Bogdanowicz  Procedure(s) Performed: Procedure(s) (LRB): COLONOSCOPY WITH PROPOFOL (N/A) ESOPHAGOGASTRODUODENOSCOPY (EGD) WITH PROPOFOL (N/A)  Patient location during evaluation: Endoscopy Anesthesia Type: General Level of consciousness: awake and alert and oriented Pain management: pain level controlled Vital Signs Assessment: post-procedure vital signs reviewed and stable Respiratory status: spontaneous breathing, nonlabored ventilation and respiratory function stable Cardiovascular status: blood pressure returned to baseline and stable Postop Assessment: no signs of nausea or vomiting Anesthetic complications: no     Last Vitals:  Vitals:   11/25/16 0820 11/25/16 0830  BP: 124/85 115/75  Pulse: 89 88  Resp: 17 (!) 22  Temp:      Last Pain:  Vitals:   11/25/16 0810  TempSrc: Tympanic                 Aleni Andrus

## 2016-11-25 NOTE — H&P (Signed)
Outpatient short stay form Pre-procedure 11/25/2016 7:14 AM Diane Sails MD  Primary Physician: Dr. [name]  Reason for visit:    EGD and colonoscopy  History of present illness:  Patient is a 56 year old female presenting today as above. She has a personal history of bowel syndrome. She does complain of lower abdominal pain as well as reflux symptoms. She takes Celebrex regularly. He does get some intermittent nausea vomiting and bloating. He tolerated her prep well. She denies use of any aspirin products thinning agents.    Current Facility-Administered Medications:  .  0.9 %  sodium chloride infusion, , Intravenous, Continuous, Diane Sails, MD, Last Rate: 20 mL/hr at 11/25/16 0710, 1,000 mL at 11/25/16 0710 .  0.9 %  sodium chloride infusion, , Intravenous, Continuous, Diane Sails, MD  Prescriptions Prior to Admission  Medication Sig Dispense Refill Last Dose  . albuterol (PROVENTIL HFA;VENTOLIN HFA) 108 (90 BASE) MCG/ACT inhaler Inhale 4-6 puffs by mouth every 4 hours as needed for wheezing, cough, and/or shortness of breath 1 Inhaler 1 11/24/2016 at Unknown time  . atorvastatin (LIPITOR) 10 MG tablet Take 20 mg by mouth daily.    Past Week at Unknown time  . benzonatate (TESSALON) 100 MG capsule Take by mouth 3 (three) times daily as needed for cough.   Past Week at Unknown time  . brompheniramine-pseudoephedrine-DM 30-2-10 MG/5ML syrup Take 10 mLs by mouth 4 (four) times daily as needed. 200 mL 0 Past Week at Unknown time  . Budesonide (PULMICORT FLEXHALER) 90 MCG/ACT inhaler Inhale 1 puff into the lungs 2 (two) times daily. 1 Inhaler 0 11/25/2016 at 0500  . carisoprodol (SOMA) 350 MG tablet Take 350 mg by mouth 3 (three) times daily between meals as needed for muscle spasms.   Past Month at Unknown time  . celecoxib (CELEBREX) 200 MG capsule Take 200 mg by mouth daily.   Past Month at Unknown time  . cholecalciferol (VITAMIN D) 1000 UNITS tablet Take 2,000 Units by  mouth daily.   Past Week at Unknown time  . diclofenac sodium (VOLTAREN) 1 % GEL Apply 2 g topically 4 (four) times daily. 100 g 0 Past Week at Unknown time  . escitalopram (LEXAPRO) 5 MG tablet Take 10 mg by mouth daily.    11/24/2016 at Unknown time  . famotidine (PEPCID) 10 MG tablet Take 10 mg by mouth 2 (two) times daily.   Past Week at Unknown time  . FIBER, CORN DEXTRIN, PO Take 1 tablet by mouth daily.   Past Week at Unknown time  . hydrochlorothiazide (MICROZIDE) 12.5 MG capsule Take 12.5 mg by mouth daily.   11/24/2016 at Unknown time  . hyoscyamine (LEVBID) 0.375 MG 12 hr tablet Take 0.375 mg by mouth 2 (two) times daily.   Past Week at Unknown time  . montelukast (SINGULAIR) 10 MG tablet Take 10 mg by mouth at bedtime.   11/24/2016 at Unknown time  . omeprazole (PRILOSEC) 20 MG capsule Take 20 mg by mouth daily.   11/24/2016 at Unknown time  . Probiotic Product (PROBIOTIC DAILY PO) Take 1 capsule by mouth daily.   Past Week at Unknown time  . sucralfate (CARAFATE) 1 G tablet Take 1 g by mouth 4 (four) times daily -  with meals and at bedtime.   11/24/2016 at Unknown time  . topiramate (TOPAMAX) 25 MG capsule Take 25 mg by mouth 2 (two) times daily.   11/24/2016 at Unknown time  . azithromycin (ZITHROMAX Z-PAK) 250 MG  tablet Take 2 tablets (500 mg) on  Day 1,  followed by 1 tablet (250 mg) once daily on Days 2 through 5. (Patient not taking: Reported on 11/25/2016) 6 each 0 Completed Course at Unknown time  . predniSONE (DELTASONE) 50 MG tablet Take 1 tablet (50 mg total) by mouth daily with breakfast. (Patient not taking: Reported on 11/25/2016) 5 tablet 0 Completed Course at Unknown time     Allergies  Allergen Reactions  . Chicken Allergy Rash  . Compazine [Prochlorperazine Edisylate] Nausea And Vomiting and Anxiety  . Pork-Derived Products Rash     Past Medical History:  Diagnosis Date  . Anemia   . Arthritis   . Asthma   . Elevated lipids   . GERD (gastroesophageal reflux  disease)   . IBS (irritable bowel syndrome)   . IBS (irritable bowel syndrome)   . Migraines   . Obesity   . Seasonal allergies     Review of systems:      Physical Exam    Heart and lungs: Regular rate and rhythm without rub or gallop, lungs are bilaterally clear    HEENT: Normocephalic atraumatic eyes are anicteric    Other:       Pertinant exam for procedure: Soft nontender nondistended bowel sounds positive normoactive.    Planned proceedures: EGD, colonoscopy and indicated procedures. I have discussed the risks benefits and complications of procedures to include not limited to bleeding, infection, perforation and the risk of sedation and the patient wishes to proceed.    Diane Sails, MD Gastroenterology 11/25/2016  7:14 AM

## 2016-11-25 NOTE — Op Note (Signed)
[facility] Gastroenterology Patient Name: Diane Cox Procedure Date: 11/25/2016 7:25 AM MRN: [id number] Account #: [id number] Date of Birth: Jan 30, 1961 Admit Type: Outpatient Age: 56 Room: [facility] ENDO ROOM 3 Gender: Female Note Status: Finalized Procedure:            Upper GI endoscopy Indications:          Epigastric abdominal pain, Lower abdominal pain,                        Gastro-esophageal reflux disease Providers:            Lollie Sails, MD Medicines:            Monitored Anesthesia Care Complications:        No immediate complications. Procedure:            Pre-Anesthesia Assessment:                       - ASA Grade Assessment: III - A patient with severe                        systemic disease.                       After obtaining informed consent, the endoscope was                        passed under direct vision. Throughout the procedure,                        the patient's blood pressure, pulse, and oxygen                        saturations were monitored continuously. The Endoscope                        was introduced through the mouth, and advanced to the                        fourth part of duodenum. The upper GI endoscopy was                        accomplished without difficulty. The patient tolerated                        the procedure well. Findings:      The Z-line was variable. Biopsies were taken with a cold forceps for       histology.      The exam of the esophagus was otherwise normal.      Patchy minimal inflammation characterized by erythema was found in the       gastric body. Biopsies were taken with a cold forceps for histology.      Patchy moderate inflammation characterized by congestion (edema),       erosions, erythema and shallow ulcerations was found in the gastric       antrum and in the prepyloric region of the stomach. Biopsies were taken       with a cold forceps for Helicobacter pylori testing. Biopsies  were taken       with a cold forceps for histology.      The cardia and gastric  fundus were normal on retroflexion.      Patchy mild mucosal variance characterized by smoothness and altered       texture was found in the second portion of the duodenum, in the third       portion of the duodenum and in the fourth portion of the duodenum.       Biopsies were taken with a cold forceps for histology. Impression:           - Z-line variable. Biopsied.                       - Gastritis. Biopsied.                       - Erosive gastritis. Biopsied.                       - Mucosal variant in the duodenum. Biopsied. Recommendation:       - Use Protonix (pantoprazole) 40 mg PO BID daily.                       - Perform a colonoscopy today. Procedure Code(s):    --- Professional ---                       [id number], Esophagogastroduodenoscopy, flexible, transoral;                        with biopsy, single or multiple Diagnosis Code(s):    --- Professional ---                       K22.8, Other specified diseases of esophagus                       K29.70, Gastritis, unspecified, without bleeding                       K29.60, Other gastritis without bleeding                       K31.89, Other diseases of stomach and duodenum                       R10.13, Epigastric pain                       R10.30, Lower abdominal pain, unspecified                       K21.9, Gastro-esophageal reflux disease without                        esophagitis CPT copyright 2016 American Medical Association. All rights reserved. The codes documented in this report are preliminary and upon coder review may  be revised to meet current compliance requirements. Lollie Sails, MD 11/25/2016 7:47:33 AM This report has been signed electronically. Number of Addenda: 0 Note Initiated On: 11/25/2016 7:25 AM      [facility]

## 2016-11-25 NOTE — Anesthesia Preprocedure Evaluation (Signed)
Anesthesia Evaluation  Patient identified by MRN, date of birth, ID band Patient awake    Reviewed: Allergy & Precautions, NPO status , Patient's Chart, lab work & pertinent test results  History of Anesthesia Complications Negative for: history of anesthetic complications  Airway Mallampati: III  TM Distance: >3 FB Neck ROM: Full    Dental no notable dental hx.    Pulmonary asthma , neg sleep apnea,    breath sounds clear to auscultation- rhonchi (-) wheezing      Cardiovascular Exercise Tolerance: Good (-) hypertension(-) CAD and (-) Past MI  Rhythm:Regular Rate:Normal - Systolic murmurs and - Diastolic murmurs    Neuro/Psych  Headaches, negative psych ROS   GI/Hepatic Neg liver ROS, GERD  ,  Endo/Other  neg diabetesMorbid obesity  Renal/GU negative Renal ROS     Musculoskeletal  (+) Arthritis ,   Abdominal (+) + obese,   Peds  Hematology  (+) anemia ,   Anesthesia Other Findings Past Medical History: No date: Anemia No date: Arthritis No date: Asthma No date: Elevated lipids No date: GERD (gastroesophageal reflux disease) No date: IBS (irritable bowel syndrome) No date: IBS (irritable bowel syndrome) No date: Migraines No date: Obesity No date: Seasonal allergies   Reproductive/Obstetrics                             Anesthesia Physical Anesthesia Plan  ASA: II  Anesthesia Plan: General   Post-op Pain Management:    Induction: Intravenous  Airway Management Planned: Natural Airway  Additional Equipment:   Intra-op Plan:   Post-operative Plan:   Informed Consent: I have reviewed the patients History and Physical, chart, labs and discussed the procedure including the risks, benefits and alternatives for the proposed anesthesia with the patient or authorized representative who has indicated his/her understanding and acceptance.   Dental advisory given  Plan  Discussed with: CRNA and Anesthesiologist  Anesthesia Plan Comments:         Anesthesia Quick Evaluation

## 2016-11-26 DIAGNOSIS — M5136 Other intervertebral disc degeneration, lumbar region: Secondary | ICD-10-CM | POA: Insufficient documentation

## 2016-11-27 ENCOUNTER — Encounter: Payer: Self-pay | Admitting: Gastroenterology

## 2016-11-27 LAB — SURGICAL PATHOLOGY

## 2017-01-20 ENCOUNTER — Emergency Department
Admission: EM | Admit: 2017-01-20 | Discharge: 2017-01-20 | Disposition: A | Discharge status: Home / Self Care | Payer: BLUE CROSS/BLUE SHIELD | Attending: Emergency Medicine | Admitting: Emergency Medicine

## 2017-01-20 DIAGNOSIS — J069 Acute upper respiratory infection, unspecified: Secondary | ICD-10-CM

## 2017-01-20 DIAGNOSIS — Z79899 Other long term (current) drug therapy: Secondary | ICD-10-CM | POA: Diagnosis not present

## 2017-01-20 DIAGNOSIS — J101 Influenza due to other identified influenza virus with other respiratory manifestations: Secondary | ICD-10-CM | POA: Diagnosis not present

## 2017-01-20 DIAGNOSIS — R509 Fever, unspecified: Secondary | ICD-10-CM | POA: Diagnosis present

## 2017-01-20 DIAGNOSIS — J45909 Unspecified asthma, uncomplicated: Secondary | ICD-10-CM | POA: Diagnosis not present

## 2017-01-20 DIAGNOSIS — R112 Nausea with vomiting, unspecified: Secondary | ICD-10-CM

## 2017-01-20 LAB — COMPREHENSIVE METABOLIC PANEL
ALK PHOS: 117 U/L (ref 38–126)
ALT: 48 U/L (ref 14–54)
ANION GAP: 8 (ref 5–15)
AST: 37 U/L (ref 15–41)
Albumin: 4.3 g/dL (ref 3.5–5.0)
BUN: 7 mg/dL (ref 6–20)
CALCIUM: 8.7 mg/dL — AB (ref 8.9–10.3)
CO2: 25 mmol/L (ref 22–32)
Chloride: 101 mmol/L (ref 101–111)
Creatinine, Ser: 0.6 mg/dL (ref 0.44–1.00)
Glucose, Bld: 205 mg/dL — ABNORMAL HIGH (ref 65–99)
Potassium: 3.1 mmol/L — ABNORMAL LOW (ref 3.5–5.1)
SODIUM: 134 mmol/L — AB (ref 135–145)
Total Bilirubin: 0.5 mg/dL (ref 0.3–1.2)
Total Protein: 7.7 g/dL (ref 6.5–8.1)

## 2017-01-20 LAB — URINALYSIS, COMPLETE (UACMP) WITH MICROSCOPIC
BILIRUBIN URINE: NEGATIVE
Bacteria, UA: NONE SEEN
GLUCOSE, UA: NEGATIVE mg/dL
HGB URINE DIPSTICK: NEGATIVE
Ketones, ur: 5 mg/dL — AB
LEUKOCYTES UA: NEGATIVE
NITRITE: NEGATIVE
PH: 5 (ref 5.0–8.0)
Protein, ur: NEGATIVE mg/dL
SPECIFIC GRAVITY, URINE: 1.027 (ref 1.005–1.030)

## 2017-01-20 LAB — CBC
HCT: 36.4 % (ref 35.0–47.0)
HEMOGLOBIN: 12.5 g/dL (ref 12.0–16.0)
MCH: 30.8 pg (ref 26.0–34.0)
MCHC: 34.4 g/dL (ref 32.0–36.0)
MCV: 89.6 fL (ref 80.0–100.0)
PLATELETS: 277 10*3/uL (ref 150–440)
RBC: 4.06 MIL/uL (ref 3.80–5.20)
RDW: 12.4 % (ref 11.5–14.5)
WBC: 6.2 10*3/uL (ref 3.6–11.0)

## 2017-01-20 LAB — INFLUENZA PANEL BY PCR (TYPE A & B)
Influenza A By PCR: NEGATIVE
Influenza B By PCR: POSITIVE — AB

## 2017-01-20 LAB — LIPASE, BLOOD: LIPASE: 12 U/L (ref 11–51)

## 2017-01-20 MED ORDER — ONDANSETRON 4 MG PO TBDP: 4.0000 mg | ORAL_TABLET | Freq: Three times a day (TID) | ORAL | 0 refills | Status: DC | PRN

## 2017-01-20 MED ORDER — ONDANSETRON 4 MG PO TBDP
4.0000 mg | ORAL_TABLET | Freq: Once | ORAL | Status: AC
  Administered 2017-01-20: 4 mg via ORAL
  Filled 2017-01-20: qty 1

## 2017-01-20 MED ORDER — POTASSIUM CHLORIDE CRYS ER 20 MEQ PO TBCR
40.0000 meq | EXTENDED_RELEASE_TABLET | Freq: Once | ORAL | Status: AC
  Administered 2017-01-20: 40 meq via ORAL
  Filled 2017-01-20: qty 2

## 2017-01-20 NOTE — ED Provider Notes (Signed)
[facility] Emergency Department Provider Note   ____________________________________________   First MD Initiated Contact with Patient 01/20/17 [phone]     (approximate)  I have reviewed the triage vital signs and the nursing notes.   HISTORY  Chief Complaint Fever    HPI Guillermo Difrancesco is a 56 y.o. female who comes into the hospital today with some cough, fever, chills. The patient did not check her temperature but reports that it did get pretty high. She reports that she started having some vomiting today. She states that she vomited 4 times. She's had no appetite but has been able to drink up until this evening. The symptoms started at 2 AM on Sunday. The patient's significant other was sick with the same symptoms. He states it was either a mild flu or a bad cold. Initially she was able to take any medicine but again she's been unable to keep any medicines down since about 8 PM last night. The patient states she has abdominal pain when she vomits but it goes away afterwards. She does have a sore throat that she rated 9 out of 10 in intensity currently. The patient has no abdominal pain right now. The patient decided to come into the hospital today for evaluation.   Past Medical History:  Diagnosis Date  . Anemia   . Arthritis   . Asthma   . Elevated lipids   . GERD (gastroesophageal reflux disease)   . IBS (irritable bowel syndrome)   . IBS (irritable bowel syndrome)   . Migraines   . Obesity   . Seasonal allergies     There are no active problems to display for this patient.   Past Surgical History:  Procedure Laterality Date  . ABDOMINAL HYSTERECTOMY    . CHOLECYSTECTOMY    . COLONOSCOPY WITH PROPOFOL N/A 11/25/2016   Procedure: COLONOSCOPY WITH PROPOFOL;  Surgeon: Lollie Sails, MD;  Location: [facility] ENDOSCOPY;  Service: Endoscopy;  Laterality: N/A;  . ESOPHAGOGASTRODUODENOSCOPY (EGD) WITH PROPOFOL N/A 11/25/2016   Procedure:  ESOPHAGOGASTRODUODENOSCOPY (EGD) WITH PROPOFOL;  Surgeon: Lollie Sails, MD;  Location: [facility] ENDOSCOPY;  Service: Endoscopy;  Laterality: N/A;  . TONSILLECTOMY      Prior to Admission medications   Medication Sig Start Date End Date Taking? Authorizing Provider  albuterol (PROVENTIL HFA;VENTOLIN HFA) 108 (90 BASE) MCG/ACT inhaler Inhale 4-6 puffs by mouth every 4 hours as needed for wheezing, cough, and/or shortness of breath 07/23/15   Hinda Kehr, MD  atorvastatin (LIPITOR) 10 MG tablet Take 20 mg by mouth daily.     Historical Provider, MD  azithromycin (ZITHROMAX Z-PAK) 250 MG tablet Take 2 tablets (500 mg) on  Day 1,  followed by 1 tablet (250 mg) once daily on Days 2 through 5. Patient not taking: Reported on 11/25/2016 07/27/16   Charline Bills Cuthriell, PA-C  benzonatate (TESSALON) 100 MG capsule Take by mouth 3 (three) times daily as needed for cough.    Historical Provider, MD  brompheniramine-pseudoephedrine-DM 30-2-10 MG/5ML syrup Take 10 mLs by mouth 4 (four) times daily as needed. 07/27/16   Charline Bills Cuthriell, PA-C  Budesonide (PULMICORT FLEXHALER) 90 MCG/ACT inhaler Inhale 1 puff into the lungs 2 (two) times daily. 07/23/15   Hinda Kehr, MD  carisoprodol (SOMA) 350 MG tablet Take 350 mg by mouth 3 (three) times daily between meals as needed for muscle spasms.    Historical Provider, MD  celecoxib (CELEBREX) 200 MG capsule Take 200 mg by mouth daily.  Historical Provider, MD  cholecalciferol (VITAMIN D) 1000 UNITS tablet Take 2,000 Units by mouth daily.    Historical Provider, MD  diclofenac sodium (VOLTAREN) 1 % GEL Apply 2 g topically 4 (four) times daily. 05/29/15   Jan Fireman, PA-C  escitalopram (LEXAPRO) 5 MG tablet Take 10 mg by mouth daily.     Historical Provider, MD  famotidine (PEPCID) 10 MG tablet Take 10 mg by mouth 2 (two) times daily.    Historical Provider, MD  FIBER, CORN DEXTRIN, PO Take 1 tablet by mouth daily.    Historical Provider, MD  hydrochlorothiazide  (MICROZIDE) 12.5 MG capsule Take 12.5 mg by mouth daily.    Historical Provider, MD  hyoscyamine (LEVBID) 0.375 MG 12 hr tablet Take 0.375 mg by mouth 2 (two) times daily.    Historical Provider, MD  montelukast (SINGULAIR) 10 MG tablet Take 10 mg by mouth at bedtime.    Historical Provider, MD  omeprazole (PRILOSEC) 20 MG capsule Take 20 mg by mouth daily.    Historical Provider, MD  predniSONE (DELTASONE) 50 MG tablet Take 1 tablet (50 mg total) by mouth daily with breakfast. Patient not taking: Reported on 11/25/2016 07/27/16   Charline Bills Cuthriell, PA-C  Probiotic Product (PROBIOTIC DAILY PO) Take 1 capsule by mouth daily.    Historical Provider, MD  sucralfate (CARAFATE) 1 G tablet Take 1 g by mouth 4 (four) times daily -  with meals and at bedtime.    Historical Provider, MD  topiramate (TOPAMAX) 25 MG capsule Take 25 mg by mouth 2 (two) times daily.    Historical Provider, MD    Allergies Chicken allergy; Compazine [prochlorperazine edisylate]; and Pork-derived products  Family History  Problem Relation Age of Onset  . Heart attack Father   . Breast cancer Maternal Aunt     Social History Social History  Substance Use Topics  . Smoking status: Never Smoker  . Smokeless tobacco: Never Used  . Alcohol use 0.0 oz/week     Comment: occasional    Review of Systems Constitutional:  fever/chills Eyes: No visual changes. ENT:  sore throat. Cardiovascular: Denies chest pain. Respiratory: Cough Gastrointestinal: abdominal pain, nausea, vomiting.  No diarrhea.  No constipation. Genitourinary: Negative for dysuria. Musculoskeletal: Negative for back pain. Skin: Negative for rash. Neurological: Negative for headaches, focal weakness or numbness.  10-point ROS otherwise negative.  ____________________________________________   PHYSICAL EXAM:  VITAL SIGNS: ED Triage Vitals [01/20/17 0349]  Enc Vitals Group     BP 127/71     Pulse Rate 94     Resp 18     Temp 99.1 F (37.3  C)     Temp Source Oral     SpO2 95 %     Weight 220 lb (99.8 kg)     Height 5\' 1"  (1.549 m)     Head Circumference      Peak Flow      Pain Score 9     Pain Loc      Pain Edu?      Excl. in [place]?     Constitutional: Alert and oriented. Well appearing and in mild distress. Eyes: Conjunctivae are normal. PERRL. EOMI. Head: Atraumatic. Nose: No congestion/rhinnorhea. Mouth/Throat: Mucous membranes are moist.  Oropharynx non-erythematous. Cardiovascular: Normal rate, regular rhythm. Grossly normal heart sounds.  Good peripheral circulation. Respiratory: Normal respiratory effort.  No retractions. Lungs CTAB. Gastrointestinal: Soft and nontender. No distention. Positive bowel sounds Musculoskeletal: No lower extremity tenderness nor edema.  Neurologic:  Normal speech and language.  Skin:  Skin is warm, dry and intact. Psychiatric: Mood and affect are normal.   ____________________________________________   LABS (all labs ordered are listed, but only abnormal results are displayed)  Labs Reviewed  COMPREHENSIVE METABOLIC PANEL - Abnormal; Notable for the following:       Result Value   Sodium 134 (*)    Potassium 3.1 (*)    Glucose, Bld 205 (*)    Calcium 8.7 (*)    All other components within normal limits  URINALYSIS, COMPLETE (UACMP) WITH MICROSCOPIC - Abnormal; Notable for the following:    Color, Urine YELLOW (*)    APPearance HAZY (*)    Ketones, ur 5 (*)    Squamous Epithelial / LPF 6-30 (*)    All other components within normal limits  CBC  LIPASE, BLOOD  INFLUENZA PANEL BY PCR (TYPE A & B)   ____________________________________________  EKG  none ____________________________________________  RADIOLOGY  none ____________________________________________   PROCEDURES  Procedure(s) performed: None  Procedures  Critical Care performed: No  ____________________________________________   INITIAL IMPRESSION / ASSESSMENT AND PLAN / ED  COURSE  Pertinent labs & imaging results that were available during my care of the patient were reviewed by me and considered in my medical decision making (see chart for details).  This is a 56 year old female who comes into the hospital today with some fever, cough, sore throat and vomiting. The patient did have some blood work done that was unremarkable. I will give the patient some ibuprofen and some dexamethasone for her sore throat. I did give her some Zofran or her nausea. The patient's care will be signed out to Dr. Mariea Clonts who will follow-up the results of the patient's influenza and reassess the patient's ability to take by mouth intake.       ____________________________________________   FINAL CLINICAL IMPRESSION(S) / ED DIAGNOSES  Final diagnoses:  Non-intractable vomiting with nausea, unspecified vomiting type  Viral upper respiratory tract infection      NEW MEDICATIONS STARTED DURING THIS VISIT:  New Prescriptions   No medications on file     Note:  This document was prepared using Dragon voice recognition software and may include unintentional dictation errors.    Loney Hering, MD 01/20/17 [phone]

## 2017-01-20 NOTE — ED Triage Notes (Signed)
Pt in with cough, fever, and chills for few days. Yesterday started vomiting and has vomited x 3 since, denies any diarrhea. Pt co right sided abd pain, denies any dysuria.

## 2017-01-20 NOTE — Discharge Instructions (Signed)
Please drink plenty of fluid to stay well hydrated.  Return to the emergency department for fever, inability to keep down fluids, lightheadedness or fainting, or any other symptoms concerning to you.

## 2017-07-03 DIAGNOSIS — M7918 Myalgia, other site: Secondary | ICD-10-CM | POA: Insufficient documentation

## 2017-07-13 ENCOUNTER — Emergency Department
Admission: EM | Admit: 2017-07-13 | Discharge: 2017-07-13 | Disposition: A | Discharge status: Home / Self Care | Payer: BLUE CROSS/BLUE SHIELD | Attending: Emergency Medicine | Admitting: Emergency Medicine

## 2017-07-13 DIAGNOSIS — J45909 Unspecified asthma, uncomplicated: Secondary | ICD-10-CM | POA: Insufficient documentation

## 2017-07-13 DIAGNOSIS — K644 Residual hemorrhoidal skin tags: Secondary | ICD-10-CM | POA: Insufficient documentation

## 2017-07-13 DIAGNOSIS — Z79899 Other long term (current) drug therapy: Secondary | ICD-10-CM | POA: Diagnosis not present

## 2017-07-13 DIAGNOSIS — K625 Hemorrhage of anus and rectum: Secondary | ICD-10-CM | POA: Diagnosis present

## 2017-07-13 LAB — COMPREHENSIVE METABOLIC PANEL
ALK PHOS: 98 U/L (ref 38–126)
ALT: 28 U/L (ref 14–54)
AST: 28 U/L (ref 15–41)
Albumin: 4.3 g/dL (ref 3.5–5.0)
Anion gap: 11 (ref 5–15)
BUN: 17 mg/dL (ref 6–20)
CALCIUM: 9.3 mg/dL (ref 8.9–10.3)
CO2: 26 mmol/L (ref 22–32)
CREATININE: 0.64 mg/dL (ref 0.44–1.00)
Chloride: 100 mmol/L — ABNORMAL LOW (ref 101–111)
Glucose, Bld: 153 mg/dL — ABNORMAL HIGH (ref 65–99)
Potassium: 3.5 mmol/L (ref 3.5–5.1)
Sodium: 137 mmol/L (ref 135–145)
TOTAL PROTEIN: 7.8 g/dL (ref 6.5–8.1)
Total Bilirubin: 0.5 mg/dL (ref 0.3–1.2)

## 2017-07-13 LAB — URINALYSIS, COMPLETE (UACMP) WITH MICROSCOPIC
Bacteria, UA: NONE SEEN
Bilirubin Urine: NEGATIVE
GLUCOSE, UA: NEGATIVE mg/dL
Ketones, ur: NEGATIVE mg/dL
Leukocytes, UA: NEGATIVE
Nitrite: NEGATIVE
PROTEIN: NEGATIVE mg/dL
Specific Gravity, Urine: 1.016 (ref 1.005–1.030)
pH: 6 (ref 5.0–8.0)

## 2017-07-13 LAB — CBC
HCT: 39.4 % (ref 35.0–47.0)
Hemoglobin: 13.3 g/dL (ref 12.0–16.0)
MCH: 29.7 pg (ref 26.0–34.0)
MCHC: 33.8 g/dL (ref 32.0–36.0)
MCV: 87.8 fL (ref 80.0–100.0)
PLATELETS: 381 10*3/uL (ref 150–440)
RBC: 4.49 MIL/uL (ref 3.80–5.20)
RDW: 12.5 % (ref 11.5–14.5)
WBC: 12.6 10*3/uL — AB (ref 3.6–11.0)

## 2017-07-13 LAB — LIPASE, BLOOD: Lipase: 36 U/L (ref 11–51)

## 2017-07-13 LAB — TYPE AND SCREEN
ABO/RH(D): A POS
Antibody Screen: NEGATIVE

## 2017-07-13 MED ORDER — DOCUSATE SODIUM 100 MG PO CAPS: 200.0000 mg | ORAL_CAPSULE | Freq: Two times a day (BID) | ORAL | 0 refills | Status: DC

## 2017-07-13 MED ORDER — SODIUM CHLORIDE 0.9 % IV BOLUS (SEPSIS)
1000.0000 mL | Freq: Once | INTRAVENOUS | Status: AC
  Administered 2017-07-13: 1000 mL via INTRAVENOUS

## 2017-07-13 NOTE — Discharge Instructions (Signed)
Your vital signs and blood counts were all normal today.  The bleeding appears to be due to a hemorrhoid, and should be finished bleeding at this time. Use stool softeners to avoid further bleeding.

## 2017-07-13 NOTE — ED Triage Notes (Signed)
Pt c/o having lower abd pain with bright red blood with clots since Friday, states she has had some N/V.Marland Kitchen States she has had 2 bloody stools today. Denies a Hx of bloody stool, states she has had bleeding hemorrhoids in the past..

## 2017-07-13 NOTE — ED Provider Notes (Signed)
[facility] Emergency Department Provider Note  ____________________________________________  Time seen: Approximately 2:15 PM  I have reviewed the triage vital signs and the nursing notes.   HISTORY  Chief Complaint GI Bleeding    HPI Diane Cox is a 56 y.o. female comes the ED complaining of lower abdominal pain and rectal bleeding. she's had multiple episodes of bright red bloodwith stool. She does not have bleeding without having a bowel movement. Reports that she has seen some clots. Also says she's had some nausea and vomiting that is nonbloody. No chest pain shortness of breath dizziness or syncope. Does note that her rheumatologist recently had her take a 6 day taper of prednisone and that she has a history of peptic ulcers. She has been compliant with her PPI that she's been on for 8 months.Marland Kitchen  abdominal pain is constant, waxing and waning, sharp, moderate intensity. Nonradiating.   Past Medical History:  Diagnosis Date  . Anemia   . Arthritis   . Asthma   . Elevated lipids   . GERD (gastroesophageal reflux disease)   . IBS (irritable bowel syndrome)   . IBS (irritable bowel syndrome)   . Migraines   . Obesity   . Seasonal allergies      There are no active problems to display for this patient.    Past Surgical History:  Procedure Laterality Date  . ABDOMINAL HYSTERECTOMY    . CHOLECYSTECTOMY    . COLONOSCOPY WITH PROPOFOL N/A 11/25/2016   Procedure: COLONOSCOPY WITH PROPOFOL;  Surgeon: Lollie Sails, MD;  Location: [facility] ENDOSCOPY;  Service: Endoscopy;  Laterality: N/A;  . ESOPHAGOGASTRODUODENOSCOPY (EGD) WITH PROPOFOL N/A 11/25/2016   Procedure: ESOPHAGOGASTRODUODENOSCOPY (EGD) WITH PROPOFOL;  Surgeon: Lollie Sails, MD;  Location: [facility] ENDOSCOPY;  Service: Endoscopy;  Laterality: N/A;  . TONSILLECTOMY       Prior to Admission medications   Medication Sig Start Date End Date Taking? Authorizing Provider  albuterol  (PROVENTIL HFA;VENTOLIN HFA) 108 (90 BASE) MCG/ACT inhaler Inhale 4-6 puffs by mouth every 4 hours as needed for wheezing, cough, and/or shortness of breath 07/23/15   Hinda Kehr, MD  atorvastatin (LIPITOR) 10 MG tablet Take 20 mg by mouth daily.     [provider]  azithromycin (ZITHROMAX Z-PAK) 250 MG tablet Take 2 tablets (500 mg) on  Day 1,  followed by 1 tablet (250 mg) once daily on Days 2 through 5. Patient not taking: Reported on 11/25/2016 07/27/16   Cuthriell, Charline Bills, PA-C  benzonatate (TESSALON) 100 MG capsule Take by mouth 3 (three) times daily as needed for cough.    [provider]  brompheniramine-pseudoephedrine-DM 30-2-10 MG/5ML syrup Take 10 mLs by mouth 4 (four) times daily as needed. 07/27/16   Cuthriell, Charline Bills, PA-C  Budesonide (PULMICORT FLEXHALER) 90 MCG/ACT inhaler Inhale 1 puff into the lungs 2 (two) times daily. 07/23/15   Hinda Kehr, MD  carisoprodol (SOMA) 350 MG tablet Take 350 mg by mouth 3 (three) times daily between meals as needed for muscle spasms.    [provider]  celecoxib (CELEBREX) 200 MG capsule Take 200 mg by mouth daily.    [provider]  cholecalciferol (VITAMIN D) 1000 UNITS tablet Take 2,000 Units by mouth daily.    [provider]  diclofenac sodium (VOLTAREN) 1 % GEL Apply 2 g topically 4 (four) times daily. 05/29/15   Jan Fireman, PA-C  escitalopram (LEXAPRO) 5 MG tablet Take 10 mg by mouth daily.  [provider]  famotidine (PEPCID) 10 MG tablet Take 10 mg by mouth 2 (two) times daily.    [provider]  FIBER, CORN DEXTRIN, PO Take 1 tablet by mouth daily.    [provider]  hydrochlorothiazide (MICROZIDE) 12.5 MG capsule Take 12.5 mg by mouth daily.    [provider]  hyoscyamine (LEVBID) 0.375 MG 12 hr tablet Take 0.375 mg by mouth 2 (two) times daily.    [provider]  montelukast (SINGULAIR) 10 MG tablet Take 10 mg by mouth at  bedtime.    [provider]  omeprazole (PRILOSEC) 20 MG capsule Take 20 mg by mouth daily.    [provider]  ondansetron (ZOFRAN ODT) 4 MG disintegrating tablet Take 1 tablet (4 mg total) by mouth every 8 (eight) hours as needed for nausea or vomiting. 01/20/17   Eula Listen, MD  predniSONE (DELTASONE) 50 MG tablet Take 1 tablet (50 mg total) by mouth daily with breakfast. Patient not taking: Reported on 11/25/2016 07/27/16   Cuthriell, Charline Bills, PA-C  Probiotic Product (PROBIOTIC DAILY PO) Take 1 capsule by mouth daily.    [provider]  sucralfate (CARAFATE) 1 G tablet Take 1 g by mouth 4 (four) times daily -  with meals and at bedtime.    [provider]  topiramate (TOPAMAX) 25 MG capsule Take 25 mg by mouth 2 (two) times daily.    [provider]     Allergies Chicken allergy; Compazine [prochlorperazine edisylate]; and Pork-derived products   Family History  Problem Relation Age of Onset  . Heart attack Father   . Breast cancer Maternal Aunt     Social History Social History  Substance Use Topics  . Smoking status: Never Smoker  . Smokeless tobacco: Never Used  . Alcohol use 0.0 oz/week     Comment: occasional    Review of Systems  Constitutional:   No fever or chills.  ENT:   No sore throat. No rhinorrhea. Cardiovascular:   No chest pain or syncope. Respiratory:   No dyspnea or cough. Gastrointestinal:   positive as above for abdominal pain and vomiting and rectal bleeding Musculoskeletal:   Negative for focal pain or swelling All other systems reviewed and are negative except as documented above in ROS and HPI.  ____________________________________________   PHYSICAL EXAM:  VITAL SIGNS: ED Triage Vitals  Enc Vitals Group     BP 07/13/17 1128 (!) 157/96     Pulse Rate 07/13/17 1128 100     Resp 07/13/17 1128 18     Temp 07/13/17 1128 98.2 F (36.8 C)     Temp Source 07/13/17 1128 Oral     SpO2  07/13/17 1128 97 %     Weight 07/13/17 1130 230 lb (104.3 kg)     Height 07/13/17 1130 5\' 5"  (1.651 m)     Head Circumference --      Peak Flow --      Pain Score 07/13/17 1129 5     Pain Loc --      Pain Edu? --      Excl. in [place]? --     Vital signs reviewed, nursing assessments reviewed.   Constitutional:   Alert and oriented. Well appearing and in no distress. Eyes:   No scleral icterus.  EOMI. No nystagmus. No conjunctival pallor. PERRL. ENT   Head:   Normocephalic and atraumatic.   Nose:   No congestion/rhinnorhea.    Mouth/Throat:   MMM,  no pharyngeal erythema. No peritonsillar mass.    Neck:   No meningismus. Full ROM Hematological/Lymphatic/Immunilogical:   No cervical lymphadenopathy. Cardiovascular:   RRR. Symmetric bilateral radial and DP pulses.  No murmurs.  Respiratory:   Normal respiratory effort without tachypnea/retractions. Breath sounds are clear and equal bilaterally. No wheezes/rales/rhonchi. Gastrointestinal:   Soft and nontender. Non distended. There is no CVA tenderness.  No rebound, rigidity, or guarding.rectal exam performed with nurse Iris at bedside, brown stool, Hemoccult negative, controls okay. There is a small external hemorrhoid which does not appear to be inflamed or thrombosed. Genitourinary:   deferred Musculoskeletal:   Normal range of motion in all extremities. No joint effusions.  No lower extremity tenderness.  No edema. Neurologic:   Normal speech and language.  Motor grossly intact. No gross focal neurologic deficits are appreciated.  Skin:    Skin is warm, dry and intact. No rash noted.  No petechiae, purpura, or bullae.  ____________________________________________    LABS (pertinent positives/negatives) (all labs ordered are listed, but only abnormal results are displayed) Labs Reviewed  COMPREHENSIVE METABOLIC PANEL - Abnormal; Notable for the following:       Result Value   Chloride 100 (*)    Glucose, Bld 153 (*)     All other components within normal limits  CBC - Abnormal; Notable for the following:    WBC 12.6 (*)    All other components within normal limits  LIPASE, BLOOD  URINALYSIS, COMPLETE (UACMP) WITH MICROSCOPIC  TYPE AND SCREEN   ____________________________________________   EKG    ____________________________________________    RADIOLOGY  No results found.  ____________________________________________   PROCEDURES Procedures  ____________________________________________   INITIAL IMPRESSION / ASSESSMENT AND PLAN / ED COURSE  Pertinent labs & imaging results that were available during my care of the patient were reviewed by me and considered in my medical decision making (see chart for details).  patient well appearing no acute distress, presents with rectal bleeding. Rectal is negative, CBC is normal. Vital signs are stable. At this point is not really any evidence of a diverticular bleed, AVM, or bleeding ulcer. Patient denies hemoptysis,suitable for outpatient follow-up.Considering the patient's symptoms, medical history, and physical examination today, I have low suspicion for cholecystitis or biliary pathology, pancreatitis, perforation or bowel obstruction, hernia, intra-abdominal abscess, AAA or dissection, volvulus or intussusception, mesenteric ischemia, or appendicitis.    ----------------------------------------- 3:34 PM on 07/13/2017 -----------------------------------------  Workup negative, vital stable, no bleeding in the ED. Prescribed Colace, follow up with primary care.     ____________________________________________   FINAL CLINICAL IMPRESSION(S) / ED DIAGNOSES  Final diagnoses:  External hemorrhoid, bleeding      New Prescriptions   No medications on file     Portions of this note were generated with dragon dictation software. Dictation errors may occur despite best attempts at proofreading.     Carrie Mew, MD 07/13/17  1534

## 2017-07-13 NOTE — ED Notes (Signed)
Pt given call bell and instructed to call when she needs to urinate to obtain sample.  Pt verbalized understanding and comfortable at this time.  Pt given remote and instructed not to drink her Dr. Malachi Bonds at bedside until results were back from blood work and urine.  Pt in NAD.  Will continue to monitor.

## 2017-07-13 NOTE — ED Notes (Signed)
Pt discharged to home.  Discharge instructions reviewed.  Verbalized understanding.  No questions or concerns at this time.  Teach back verified.  Pt in NAD.  No items left in ED.   

## 2018-06-08 DIAGNOSIS — G43109 Migraine with aura, not intractable, without status migrainosus: Secondary | ICD-10-CM | POA: Insufficient documentation

## 2018-08-20 ENCOUNTER — Ambulatory Visit
Admission: RE | Admit: 2018-08-20 | Discharge: 2018-08-20 | Disposition: A | Discharge status: Home / Self Care | Payer: BLUE CROSS/BLUE SHIELD | Source: Ambulatory Visit | Attending: Physician Assistant | Admitting: Physician Assistant

## 2018-08-20 ENCOUNTER — Other Ambulatory Visit (INDEPENDENT_AMBULATORY_CARE_PROVIDER_SITE_OTHER): Payer: Self-pay | Admitting: Physician Assistant

## 2018-08-20 DIAGNOSIS — R52 Pain, unspecified: Secondary | ICD-10-CM

## 2018-11-05 ENCOUNTER — Emergency Department: Payer: BLUE CROSS/BLUE SHIELD

## 2018-11-05 ENCOUNTER — Other Ambulatory Visit: Payer: Self-pay

## 2018-11-05 ENCOUNTER — Observation Stay
Admission: EM | Admit: 2018-11-05 | Discharge: 2018-11-06 | Disposition: A | Discharge status: Home / Self Care | Payer: BLUE CROSS/BLUE SHIELD | Attending: Specialist | Admitting: Specialist

## 2018-11-05 DIAGNOSIS — J45909 Unspecified asthma, uncomplicated: Secondary | ICD-10-CM | POA: Insufficient documentation

## 2018-11-05 DIAGNOSIS — E669 Obesity, unspecified: Secondary | ICD-10-CM | POA: Diagnosis not present

## 2018-11-05 DIAGNOSIS — M1990 Unspecified osteoarthritis, unspecified site: Secondary | ICD-10-CM | POA: Insufficient documentation

## 2018-11-05 DIAGNOSIS — K219 Gastro-esophageal reflux disease without esophagitis: Secondary | ICD-10-CM | POA: Insufficient documentation

## 2018-11-05 DIAGNOSIS — Z6841 Body Mass Index (BMI) 40.0 and over, adult: Secondary | ICD-10-CM | POA: Diagnosis not present

## 2018-11-05 DIAGNOSIS — E785 Hyperlipidemia, unspecified: Secondary | ICD-10-CM | POA: Diagnosis not present

## 2018-11-05 DIAGNOSIS — E119 Type 2 diabetes mellitus without complications: Secondary | ICD-10-CM | POA: Insufficient documentation

## 2018-11-05 DIAGNOSIS — J209 Acute bronchitis, unspecified: Secondary | ICD-10-CM | POA: Diagnosis not present

## 2018-11-05 DIAGNOSIS — I10 Essential (primary) hypertension: Secondary | ICD-10-CM | POA: Diagnosis not present

## 2018-11-05 DIAGNOSIS — Z7984 Long term (current) use of oral hypoglycemic drugs: Secondary | ICD-10-CM | POA: Insufficient documentation

## 2018-11-05 DIAGNOSIS — G43909 Migraine, unspecified, not intractable, without status migrainosus: Secondary | ICD-10-CM | POA: Diagnosis not present

## 2018-11-05 DIAGNOSIS — Z7951 Long term (current) use of inhaled steroids: Secondary | ICD-10-CM | POA: Diagnosis not present

## 2018-11-05 DIAGNOSIS — R079 Chest pain, unspecified: Secondary | ICD-10-CM | POA: Diagnosis present

## 2018-11-05 DIAGNOSIS — K589 Irritable bowel syndrome without diarrhea: Secondary | ICD-10-CM | POA: Diagnosis not present

## 2018-11-05 HISTORY — DX: Type 2 diabetes mellitus without complications: E11.9

## 2018-11-05 LAB — BASIC METABOLIC PANEL
ANION GAP: 10 (ref 5–15)
BUN: 18 mg/dL (ref 6–20)
CHLORIDE: 100 mmol/L (ref 98–111)
CO2: 24 mmol/L (ref 22–32)
Calcium: 9.4 mg/dL (ref 8.9–10.3)
Creatinine, Ser: 0.76 mg/dL (ref 0.44–1.00)
GFR calc non Af Amer: >60 mL/min (ref >60)
Glucose, Bld: 188 mg/dL — ABNORMAL HIGH (ref 70–99)
POTASSIUM: 3.4 mmol/L — AB (ref 3.5–5.1)
Sodium: 134 mmol/L — ABNORMAL LOW (ref 135–145)

## 2018-11-05 LAB — CBC
HEMATOCRIT: 40.8 % (ref 36.0–46.0)
Hemoglobin: 13.3 g/dL (ref 12.0–15.0)
MCH: 29.1 pg (ref 26.0–34.0)
MCHC: 32.6 g/dL (ref 30.0–36.0)
MCV: 89.3 fL (ref 80.0–100.0)
NRBC: 0 % (ref 0.0–0.2)
Platelets: 391 10*3/uL (ref 150–400)
RBC: 4.57 MIL/uL (ref 3.87–5.11)
RDW: 11.9 % (ref 11.5–15.5)
WBC: 13.4 10*3/uL — AB (ref 4.0–10.5)

## 2018-11-05 LAB — TROPONIN I
Troponin I: 0.07 ng/mL (ref <0.03)
Troponin I: <0.03 ng/mL (ref <0.03)

## 2018-11-05 LAB — INFLUENZA PANEL BY PCR (TYPE A & B)
Influenza A By PCR: NEGATIVE
Influenza B By PCR: NEGATIVE

## 2018-11-05 LAB — GLUCOSE, CAPILLARY: Glucose-Capillary: 392 mg/dL — ABNORMAL HIGH (ref 70–99)

## 2018-11-05 MED ORDER — POLYETHYLENE GLYCOL 3350 17 G PO PACK: 17.0000 g | PACK | Freq: Every day | ORAL | Status: DC | PRN

## 2018-11-05 MED ORDER — CARISOPRODOL 350 MG PO TABS: 350.0000 mg | ORAL_TABLET | Freq: Three times a day (TID) | ORAL | Status: DC | PRN

## 2018-11-05 MED ORDER — INSULIN ASPART 100 UNIT/ML ~~LOC~~ SOLN
0.0000 [IU] | Freq: Three times a day (TID) | SUBCUTANEOUS | Status: DC
  Administered 2018-11-06 (×2): 11 [IU] via SUBCUTANEOUS
  Filled 2018-11-05 (×2): qty 1

## 2018-11-05 MED ORDER — TOPIRAMATE 25 MG PO TABS
25.0000 mg | ORAL_TABLET | Freq: Two times a day (BID) | ORAL | Status: DC
  Administered 2018-11-05 – 2018-11-06 (×2): 25 mg via ORAL
  Filled 2018-11-05 (×3): qty 1

## 2018-11-05 MED ORDER — BUDESONIDE 0.25 MG/2ML IN SUSP
0.2500 mg | Freq: Two times a day (BID) | RESPIRATORY_TRACT | Status: DC
  Administered 2018-11-05 – 2018-11-06 (×2): 0.25 mg via RESPIRATORY_TRACT
  Filled 2018-11-05 (×2): qty 2

## 2018-11-05 MED ORDER — MONTELUKAST SODIUM 10 MG PO TABS
10.0000 mg | ORAL_TABLET | Freq: Every day | ORAL | Status: DC
  Administered 2018-11-06: 10 mg via ORAL
  Filled 2018-11-05: qty 1

## 2018-11-05 MED ORDER — HYOSCYAMINE SULFATE ER 0.375 MG PO TB12
0.3750 mg | ORAL_TABLET | Freq: Two times a day (BID) | ORAL | Status: DC
  Administered 2018-11-05 – 2018-11-06 (×2): 0.375 mg via ORAL
  Filled 2018-11-05 (×3): qty 1

## 2018-11-05 MED ORDER — ESCITALOPRAM OXALATE 10 MG PO TABS
10.0000 mg | ORAL_TABLET | Freq: Every day | ORAL | Status: DC
  Administered 2018-11-06: 10 mg via ORAL
  Filled 2018-11-05: qty 1

## 2018-11-05 MED ORDER — INSULIN ASPART 100 UNIT/ML ~~LOC~~ SOLN
0.0000 [IU] | Freq: Every day | SUBCUTANEOUS | Status: DC
  Administered 2018-11-05: 5 [IU] via SUBCUTANEOUS
  Filled 2018-11-05: qty 1

## 2018-11-05 MED ORDER — ACETAMINOPHEN 650 MG RE SUPP: 650.0000 mg | Freq: Four times a day (QID) | RECTAL | Status: DC | PRN

## 2018-11-05 MED ORDER — ASPIRIN 81 MG PO CHEW
324.0000 mg | CHEWABLE_TABLET | Freq: Once | ORAL | Status: AC
  Administered 2018-11-05: 324 mg via ORAL
  Filled 2018-11-05: qty 4

## 2018-11-05 MED ORDER — METHYLPREDNISOLONE SODIUM SUCC 125 MG IJ SOLR
60.0000 mg | Freq: Two times a day (BID) | INTRAMUSCULAR | Status: DC
  Administered 2018-11-05 – 2018-11-06 (×2): 60 mg via INTRAVENOUS
  Filled 2018-11-05 (×3): qty 2

## 2018-11-05 MED ORDER — GABAPENTIN 100 MG PO CAPS
100.0000 mg | ORAL_CAPSULE | Freq: Three times a day (TID) | ORAL | Status: DC
  Administered 2018-11-05 – 2018-11-06 (×2): 100 mg via ORAL
  Filled 2018-11-05 (×2): qty 1

## 2018-11-05 MED ORDER — HYDROCODONE-ACETAMINOPHEN 5-325 MG PO TABS
1.0000 | ORAL_TABLET | Freq: Once | ORAL | Status: AC
  Administered 2018-11-05: 1 via ORAL
  Filled 2018-11-05: qty 1

## 2018-11-05 MED ORDER — SUCRALFATE 1 G PO TABS
1.0000 g | ORAL_TABLET | Freq: Two times a day (BID) | ORAL | Status: DC
  Administered 2018-11-05 – 2018-11-06 (×2): 1 g via ORAL
  Filled 2018-11-05 (×2): qty 1

## 2018-11-05 MED ORDER — PANTOPRAZOLE SODIUM 40 MG PO TBEC
40.0000 mg | DELAYED_RELEASE_TABLET | Freq: Two times a day (BID) | ORAL | Status: DC
  Administered 2018-11-05 – 2018-11-06 (×2): 40 mg via ORAL
  Filled 2018-11-05 (×2): qty 1

## 2018-11-05 MED ORDER — BENZONATATE 100 MG PO CAPS: 100.0000 mg | ORAL_CAPSULE | Freq: Three times a day (TID) | ORAL | Status: DC | PRN

## 2018-11-05 MED ORDER — TRAMADOL HCL 50 MG PO TABS: 50.0000 mg | ORAL_TABLET | Freq: Four times a day (QID) | ORAL | Status: DC | PRN

## 2018-11-05 MED ORDER — ATORVASTATIN CALCIUM 20 MG PO TABS
20.0000 mg | ORAL_TABLET | Freq: Every day | ORAL | Status: DC
  Administered 2018-11-05: 19:00:00 20 mg via ORAL
  Filled 2018-11-05 (×2): qty 1

## 2018-11-05 MED ORDER — ONDANSETRON HCL 4 MG PO TABS: 4.0000 mg | ORAL_TABLET | Freq: Four times a day (QID) | ORAL | Status: DC | PRN

## 2018-11-05 MED ORDER — HYDROCHLOROTHIAZIDE 12.5 MG PO CAPS
12.5000 mg | ORAL_CAPSULE | Freq: Every day | ORAL | Status: DC
  Administered 2018-11-06: 12.5 mg via ORAL
  Filled 2018-11-05: qty 1

## 2018-11-05 MED ORDER — ENOXAPARIN SODIUM 40 MG/0.4ML ~~LOC~~ SOLN
40.0000 mg | SUBCUTANEOUS | Status: DC
  Administered 2018-11-05: 40 mg via SUBCUTANEOUS
  Filled 2018-11-05: qty 0.4

## 2018-11-05 MED ORDER — ACETAMINOPHEN 325 MG PO TABS: 650.0000 mg | ORAL_TABLET | Freq: Four times a day (QID) | ORAL | Status: DC | PRN

## 2018-11-05 MED ORDER — ONDANSETRON HCL 4 MG/2ML IJ SOLN: 4.0000 mg | Freq: Four times a day (QID) | INTRAMUSCULAR | Status: DC | PRN

## 2018-11-05 MED ORDER — IPRATROPIUM-ALBUTEROL 0.5-2.5 (3) MG/3ML IN SOLN
3.0000 mL | Freq: Four times a day (QID) | RESPIRATORY_TRACT | Status: DC
  Administered 2018-11-05 – 2018-11-06 (×4): 3 mL via RESPIRATORY_TRACT
  Filled 2018-11-05 (×4): qty 3

## 2018-11-05 MED ORDER — SODIUM CHLORIDE 0.9% FLUSH
3.0000 mL | Freq: Two times a day (BID) | INTRAVENOUS | Status: DC
  Administered 2018-11-05 – 2018-11-06 (×2): 3 mL via INTRAVENOUS

## 2018-11-05 MED ORDER — ALBUTEROL SULFATE (2.5 MG/3ML) 0.083% IN NEBU
2.5000 mg | INHALATION_SOLUTION | RESPIRATORY_TRACT | Status: DC | PRN
  Administered 2018-11-05: 20:00:00 2.5 mg via RESPIRATORY_TRACT
  Filled 2018-11-05: qty 3

## 2018-11-05 MED ORDER — IOHEXOL 350 MG/ML SOLN
75.0000 mL | Freq: Once | INTRAVENOUS | Status: AC | PRN
  Administered 2018-11-05: 75 mL via INTRAVENOUS
  Filled 2018-11-05: qty 75

## 2018-11-05 NOTE — ED Triage Notes (Addendum)
Pt reports cough and congestion for the past three weeks. Cough is not productive but pt reports feeling as though there is phlegm that is not coming up when coughing. No fevers at home. Generalized body aches and fatigue over the past couple weeks.   Pt reports increased fatigue and SOB when walking. Pressure in chest when ambulating as well.

## 2018-11-05 NOTE — ED Provider Notes (Signed)
[facility] Emergency Department Provider Note    First MD Initiated Contact with Patient 11/05/18 1524     (approximate)  I have reviewed the triage vital signs and the nursing notes.   HISTORY  Chief Complaint Cough and Generalized Body Aches    HPI Diane Cox is a 58 y.o. female the ER for evaluation of several days and weeks of worsening shortness of breath congestion pleuritic chest discomfort.  Does have family history of DVT.  Denies any personal history of ACS or heart failure.  States that she does intermittently have leg swelling but takes HCTZ for that.  Denies any nausea or vomiting.  Not currently on any antibiotics.  Has had nonproductive cough.    Past Medical History:  Diagnosis Date  . Anemia   . Arthritis   . Asthma   . Diabetes mellitus without complication ([place])   . Elevated lipids   . GERD (gastroesophageal reflux disease)   . IBS (irritable bowel syndrome)   . IBS (irritable bowel syndrome)   . Migraines   . Obesity   . Seasonal allergies    Family History  Problem Relation Age of Onset  . Heart attack Father   . Breast cancer Maternal Aunt    Past Surgical History:  Procedure Laterality Date  . ABDOMINAL HYSTERECTOMY    . CHOLECYSTECTOMY    . COLONOSCOPY WITH PROPOFOL N/A 11/25/2016   Procedure: COLONOSCOPY WITH PROPOFOL;  Surgeon: Lollie Sails, MD;  Location: [facility] ENDOSCOPY;  Service: Endoscopy;  Laterality: N/A;  . ESOPHAGOGASTRODUODENOSCOPY (EGD) WITH PROPOFOL N/A 11/25/2016   Procedure: ESOPHAGOGASTRODUODENOSCOPY (EGD) WITH PROPOFOL;  Surgeon: Lollie Sails, MD;  Location: [facility] ENDOSCOPY;  Service: Endoscopy;  Laterality: N/A;  . TONSILLECTOMY     There are no active problems to display for this patient.     Prior to Admission medications   Medication Sig Start Date End Date Taking? Authorizing Provider  albuterol (PROVENTIL HFA;VENTOLIN HFA) 108 (90 BASE) MCG/ACT inhaler Inhale 4-6 puffs by mouth  every 4 hours as needed for wheezing, cough, and/or shortness of breath 07/23/15   Hinda Kehr, MD  albuterol (PROVENTIL HFA;VENTOLIN HFA) 108 (90 Base) MCG/ACT inhaler Inhale 2 puffs into the lungs every 6 (six) hours as needed. 01/27/17   [provider]  atorvastatin (LIPITOR) 20 MG tablet Take 20 mg by mouth daily.     [provider]  azithromycin (ZITHROMAX Z-PAK) 250 MG tablet Take 2 tablets (500 mg) on  Day 1,  followed by 1 tablet (250 mg) once daily on Days 2 through 5. Patient not taking: Reported on 11/25/2016 07/27/16   Cuthriell, Charline Bills, PA-C  benzonatate (TESSALON) 100 MG capsule Take by mouth 3 (three) times daily as needed for cough.    [provider]  brompheniramine-pseudoephedrine-DM 30-2-10 MG/5ML syrup Take 10 mLs by mouth 4 (four) times daily as needed. Patient not taking: Reported on 07/13/2017 07/27/16   Cuthriell, Charline Bills, PA-C  Budesonide (PULMICORT FLEXHALER) 90 MCG/ACT inhaler Inhale 1 puff into the lungs 2 (two) times daily. 07/23/15   Hinda Kehr, MD  carisoprodol (SOMA) 350 MG tablet Take 350 mg by mouth 3 (three) times daily between meals as needed for muscle spasms.    [provider]  cholecalciferol (VITAMIN D) 1000 UNITS tablet Take 2,000 Units by mouth daily.    [provider]  diclofenac sodium (VOLTAREN) 1 % GEL Apply 2 g topically 4 (four) times daily. Patient not taking: Reported on 07/13/2017 05/29/15  Jan Fireman, PA-C  docusate sodium (COLACE) 100 MG capsule Take 2 capsules (200 mg total) by mouth 2 (two) times daily. 07/13/17   Carrie Mew, MD  escitalopram (LEXAPRO) 10 MG tablet Take 10 mg by mouth daily.     [provider]  FIBER, CORN DEXTRIN, PO Take 1 tablet by mouth daily.    [provider]  fluticasone (FLONASE) 50 MCG/ACT nasal spray Place 2 sprays into the nose daily as needed. 05/22/17 05/22/18  [provider]  gabapentin (NEURONTIN) 100 MG capsule Take 100-200  mg by mouth 3 (three) times daily.    [provider]  hydrochlorothiazide (MICROZIDE) 12.5 MG capsule Take 12.5 mg by mouth daily.    [provider]  hyoscyamine (LEVBID) 0.375 MG 12 hr tablet Take 0.375 mg by mouth 2 (two) times daily.    [provider]  metFORMIN (GLUCOPHAGE-XR) 500 MG 24 hr tablet Take 500 mg by mouth daily.  02/09/17 02/09/18  [provider]  montelukast (SINGULAIR) 10 MG tablet Take 10 mg by mouth daily.     [provider]  ondansetron (ZOFRAN ODT) 4 MG disintegrating tablet Take 1 tablet (4 mg total) by mouth every 8 (eight) hours as needed for nausea or vomiting. Patient not taking: Reported on 07/13/2017 01/20/17   Eula Listen, MD  pantoprazole (PROTONIX) 40 MG tablet Take 40 mg by mouth 2 (two) times daily.    [provider]  predniSONE (DELTASONE) 50 MG tablet Take 1 tablet (50 mg total) by mouth daily with breakfast. Patient not taking: Reported on 11/25/2016 07/27/16   Cuthriell, Charline Bills, PA-C  Probiotic Product (PROBIOTIC DAILY PO) Take 1 capsule by mouth daily.    [provider]  sucralfate (CARAFATE) 1 G tablet Take 1 g by mouth 2 (two) times daily.     [provider]  topiramate (TOPAMAX) 25 MG capsule Take 25 mg by mouth 2 (two) times daily.    [provider]    Allergies Chicken allergy; Compazine [prochlorperazine edisylate]; and Pork-derived products    Social History Social History   Tobacco Use  . Smoking status: Never Smoker  . Smokeless tobacco: Never Used  Substance Use Topics  . Alcohol use: Yes    Alcohol/week: 0.0 standard drinks    Comment: occasional  . Drug use: No    Review of Systems Patient denies headaches, rhinorrhea, blurry vision, numbness, shortness of breath, chest pain, edema, cough, abdominal pain, nausea, vomiting, diarrhea, dysuria, fevers, rashes or hallucinations unless otherwise stated above in  HPI. ____________________________________________   PHYSICAL EXAM:  VITAL SIGNS: Vitals:   11/05/18 1432  BP: (!) 132/97  Pulse: (!) 110  Resp: 16  Temp: 98.5 F (36.9 C)  SpO2: 97%    Constitutional: Alert and oriented.  Eyes: Conjunctivae are normal.  Head: Atraumatic. Nose: No congestion/rhinnorhea. Mouth/Throat: Mucous membranes are moist.   Neck: No stridor. Painless ROM.  Cardiovascular: Normal rate, regular rhythm. Grossly normal heart sounds.  Good peripheral circulation. Respiratory: Normal respiratory effort.  No retractions. Lungs CTAB. Gastrointestinal: Soft and nontender. No distention. No abdominal bruits. No CVA tenderness. Genitourinary:  Musculoskeletal: No lower extremity tenderness nor edema.  No joint effusions. Neurologic:  Normal speech and language. No gross focal neurologic deficits are appreciated. No facial droop Skin:  Skin is warm, dry and intact. No rash noted. Psychiatric: Mood and affect are normal. Speech and behavior are normal.  ____________________________________________   LABS (all labs ordered are listed, but only abnormal  results are displayed)  Results for orders placed or performed during the hospital encounter of 11/05/18 (from the past 24 hour(s))  Basic metabolic panel     Status: Abnormal   Collection Time: 11/05/18  2:38 PM  Result Value Ref Range   Sodium 134 (L) 135 - 145 mmol/L   Potassium 3.4 (L) 3.5 - 5.1 mmol/L   Chloride 100 98 - 111 mmol/L   CO2 24 22 - 32 mmol/L   Glucose, Bld 188 (H) 70 - 99 mg/dL   BUN 18 6 - 20 mg/dL   Creatinine, Ser 0.76 0.44 - 1.00 mg/dL   Calcium 9.4 8.9 - 10.3 mg/dL   GFR calc non Af Amer >60 >60 mL/min   GFR calc Af Amer >60 >60 mL/min   Anion gap 10 5 - 15  CBC     Status: Abnormal   Collection Time: 11/05/18  2:38 PM  Result Value Ref Range   WBC 13.4 (H) 4.0 - 10.5 K/uL   RBC 4.57 3.87 - 5.11 MIL/uL   Hemoglobin 13.3 12.0 - 15.0 g/dL   HCT 40.8 36.0 - 46.0 %   MCV 89.3 80.0 -  100.0 fL   MCH 29.1 26.0 - 34.0 pg   MCHC 32.6 30.0 - 36.0 g/dL   RDW 11.9 11.5 - 15.5 %   Platelets 391 150 - 400 K/uL   nRBC 0.0 0.0 - 0.2 %  Troponin I - ONCE - STAT     Status: Abnormal   Collection Time: 11/05/18  2:38 PM  Result Value Ref Range   Troponin I 0.07 (HH) <0.03 ng/mL  Influenza panel by PCR (type A & B)     Status: None   Collection Time: 11/05/18  4:10 PM  Result Value Ref Range   Influenza A By PCR NEGATIVE NEGATIVE   Influenza B By PCR NEGATIVE NEGATIVE   ____________________________________________  EKG My review and personal interpretation at Time: 14:42   Indication: chest pain  Rate: 100  Rhythm: sinus Axis: normal Other: no stemi, nonsepcific st ab ____________________________________________  RADIOLOGY  I personally reviewed all radiographic images ordered to evaluate for the above acute complaints and reviewed radiology reports and findings.  These findings were personally discussed with the patient.  Please see medical record for radiology report.  ____________________________________________   PROCEDURES  Procedure(s) performed:  .Critical Care Performed by: Merlyn Lot, MD Authorized by: Merlyn Lot, MD   Critical care provider statement:    Critical care time (minutes):  30   Critical care time was exclusive of:  Separately billable procedures and treating other patients   Critical care was necessary to treat or prevent imminent or life-threatening deterioration of the following conditions:  Cardiac failure   Critical care was time spent personally by me on the following activities:  Development of treatment plan with patient or surrogate, discussions with consultants, evaluation of patient's response to treatment, examination of patient, obtaining history from patient or surrogate, ordering and performing treatments and interventions, ordering and review of laboratory studies, ordering and review of radiographic studies, pulse  oximetry, re-evaluation of patient's condition and review of old charts      Critical Care performed: yes ____________________________________________   INITIAL IMPRESSION / [employer] / ED COURSE  Pertinent labs & imaging results that were available during my care of the patient were reviewed by me and considered in my medical decision making (see chart for details).   DDX: ACS, pericarditis, esophagitis, boerhaaves, pe, dissection, pna, bronchitis,  costochondritis   Diane Cox is a 58 y.o. who presents to the ED with symptoms as described above.  Patient hemodynamically stable without any hypoxia.  Possible bronchitis or pneumonia.  EKG does not show any evidence of acute abnormality.  Troponin is elevated to 0.07.  Does have history of DVT in family and given her pleuritic chest pain will order CT angiogram to exclude PE.  Clinical Course as of Nov 05 1702  Fri Nov 05, 2018  1647 [place] with trace pericardial effusion.  Based on patient's elevated trop, tachycardia and sx I do believe she would benefit from admission to the hospital for further medical management.  Will heparinize given concern for nstemi.  Have discussed with the patient and available family all diagnostics and treatments performed thus far and all questions were answered to the best of my ability. The patient demonstrates understanding and agreement with plan.    [PR]    Clinical Course User Index [PR] Merlyn Lot, MD     As part of my medical decision making, I reviewed the following data within the [place] notes reviewed and incorporated, Labs reviewed, notes from prior ED visits.   ____________________________________________   FINAL CLINICAL IMPRESSION(S) / ED DIAGNOSES  Final diagnoses:  Chest pain, unspecified type      NEW MEDICATIONS STARTED DURING THIS VISIT:  New Prescriptions   No medications on file     Note:  This document was prepared  using Dragon voice recognition software and may include unintentional dictation errors.    Merlyn Lot, MD 11/05/18 [phone]

## 2018-11-05 NOTE — H&P (Signed)
[facility] [place] NAME: Diane Cox    MR#:  [id number]  DATE OF BIRTH:  1961/10/18  DATE OF ADMISSION:  11/05/2018  PRIMARY CARE PHYSICIAN: [name], MD   REQUESTING/REFERRING PHYSICIAN: Dr. Quentin Cornwall  CHIEF COMPLAINT:   Chief Complaint  Patient presents with  . Cough  . Generalized Body Aches    HISTORY OF PRESENT ILLNESS:  Diane Cox  is a 58 y.o. female with a known history of diabetes, hypertension presents to the hospital complaining of cough, congestion in her chest and chest pain.  Here in the emergency room she had a CT scan of the chest for pulmonary embolism which was normal.  EKG showed nothing acute.  Troponin found to be mildly about normal at 0.07.  Patient does not have any orthopnea.  No sputum production.  She has constant chest pain all over the chest which gets worse with taking a deep breath or coughing.  No abdominal pain or vomiting.  PAST MEDICAL HISTORY:   Past Medical History:  Diagnosis Date  . Anemia   . Arthritis   . Asthma   . Diabetes mellitus without complication ([facility])   . Elevated lipids   . GERD (gastroesophageal reflux disease)   . IBS (irritable bowel syndrome)   . IBS (irritable bowel syndrome)   . Migraines   . Obesity   . Seasonal allergies     PAST SURGICAL HISTORY:   Past Surgical History:  Procedure Laterality Date  . ABDOMINAL HYSTERECTOMY    . CHOLECYSTECTOMY    . COLONOSCOPY WITH PROPOFOL N/A 11/25/2016   Procedure: COLONOSCOPY WITH PROPOFOL;  Surgeon: Lollie Sails, MD;  Location: [facility] ENDOSCOPY;  Service: Endoscopy;  Laterality: N/A;  . ESOPHAGOGASTRODUODENOSCOPY (EGD) WITH PROPOFOL N/A 11/25/2016   Procedure: ESOPHAGOGASTRODUODENOSCOPY (EGD) WITH PROPOFOL;  Surgeon: Lollie Sails, MD;  Location: [facility] ENDOSCOPY;  Service: Endoscopy;  Laterality: N/A;  . TONSILLECTOMY      SOCIAL HISTORY:   Social History   Tobacco Use  . Smoking status: Never Smoker  .  Smokeless tobacco: Never Used  Substance Use Topics  . Alcohol use: Yes    Alcohol/week: 0.0 standard drinks    Comment: occasional    FAMILY HISTORY:   Family History  Problem Relation Age of Onset  . Heart attack Father   . Breast cancer Maternal Aunt     DRUG ALLERGIES:   Allergies  Allergen Reactions  . Chicken Allergy Rash  . Compazine [Prochlorperazine Edisylate] Nausea And Vomiting and Anxiety  . Pork-Derived Products Rash    REVIEW OF SYSTEMS:   Review of Systems  Constitutional: Positive for malaise/fatigue. Negative for chills, fever and weight loss.  HENT: Negative for hearing loss and nosebleeds.   Eyes: Negative for blurred vision, double vision and pain.  Respiratory: Positive for cough, shortness of breath and wheezing. Negative for hemoptysis and sputum production.   Cardiovascular: Positive for chest pain. Negative for palpitations, orthopnea and leg swelling.  Gastrointestinal: Negative for abdominal pain, constipation, diarrhea, nausea and vomiting.  Genitourinary: Negative for dysuria and hematuria.  Musculoskeletal: Negative for back pain, falls and myalgias.  Skin: Negative for rash.  Neurological: Negative for dizziness, tremors, sensory change, speech change, focal weakness, seizures and headaches.  Endo/Heme/Allergies: Does not bruise/bleed easily.  Psychiatric/Behavioral: Negative for depression and memory loss. The patient is not nervous/anxious.     MEDICATIONS AT HOME:   Prior to Admission medications   Medication Sig Start Date End Date  Taking? Authorizing Provider  albuterol (PROVENTIL HFA;VENTOLIN HFA) 108 (90 BASE) MCG/ACT inhaler Inhale 4-6 puffs by mouth every 4 hours as needed for wheezing, cough, and/or shortness of breath 07/23/15   Hinda Kehr, MD  albuterol (PROVENTIL HFA;VENTOLIN HFA) 108 (90 Base) MCG/ACT inhaler Inhale 2 puffs into the lungs every 6 (six) hours as needed. 01/27/17   [provider]  atorvastatin  (LIPITOR) 20 MG tablet Take 20 mg by mouth daily.     [provider]  azithromycin (ZITHROMAX Z-PAK) 250 MG tablet Take 2 tablets (500 mg) on  Day 1,  followed by 1 tablet (250 mg) once daily on Days 2 through 5. Patient not taking: Reported on 11/25/2016 07/27/16   Cuthriell, Charline Bills, PA-C  benzonatate (TESSALON) 100 MG capsule Take by mouth 3 (three) times daily as needed for cough.    [provider]  brompheniramine-pseudoephedrine-DM 30-2-10 MG/5ML syrup Take 10 mLs by mouth 4 (four) times daily as needed. Patient not taking: Reported on 07/13/2017 07/27/16   Cuthriell, Charline Bills, PA-C  Budesonide (PULMICORT FLEXHALER) 90 MCG/ACT inhaler Inhale 1 puff into the lungs 2 (two) times daily. 07/23/15   Hinda Kehr, MD  carisoprodol (SOMA) 350 MG tablet Take 350 mg by mouth 3 (three) times daily between meals as needed for muscle spasms.    [provider]  cholecalciferol (VITAMIN D) 1000 UNITS tablet Take 2,000 Units by mouth daily.    [provider]  diclofenac sodium (VOLTAREN) 1 % GEL Apply 2 g topically 4 (four) times daily. Patient not taking: Reported on 07/13/2017 05/29/15   Jan Fireman, PA-C  docusate sodium (COLACE) 100 MG capsule Take 2 capsules (200 mg total) by mouth 2 (two) times daily. 07/13/17   Carrie Mew, MD  escitalopram (LEXAPRO) 10 MG tablet Take 10 mg by mouth daily.     [provider]  FIBER, CORN DEXTRIN, PO Take 1 tablet by mouth daily.    [provider]  fluticasone (FLONASE) 50 MCG/ACT nasal spray Place 2 sprays into the nose daily as needed. 05/22/17 05/22/18  [provider]  gabapentin (NEURONTIN) 100 MG capsule Take 100-200 mg by mouth 3 (three) times daily.    [provider]  hydrochlorothiazide (MICROZIDE) 12.5 MG capsule Take 12.5 mg by mouth daily.    [provider]  hyoscyamine (LEVBID) 0.375 MG 12 hr tablet Take 0.375 mg by mouth 2 (two) times daily.    [provider]  metFORMIN (GLUCOPHAGE-XR) 500 MG 24 hr tablet Take 500 mg by mouth daily.  02/09/17 02/09/18  [provider]  montelukast (SINGULAIR) 10 MG tablet Take 10 mg by mouth daily.     [provider]  ondansetron (ZOFRAN ODT) 4 MG disintegrating tablet Take 1 tablet (4 mg total) by mouth every 8 (eight) hours as needed for nausea or vomiting. Patient not taking: Reported on 07/13/2017 01/20/17   Eula Listen, MD  pantoprazole (PROTONIX) 40 MG tablet Take 40 mg by mouth 2 (two) times daily.    [provider]  predniSONE (DELTASONE) 50 MG tablet Take 1 tablet (50 mg total) by mouth daily with breakfast. Patient not taking: Reported on 11/25/2016 07/27/16   Cuthriell, Charline Bills, PA-C  Probiotic Product (PROBIOTIC DAILY PO) Take 1 capsule by mouth daily.    [provider]  sucralfate (CARAFATE) 1 G tablet Take 1 g by mouth 2 (two) times daily.     [provider]  topiramate (TOPAMAX) 25 MG capsule Take 25  mg by mouth 2 (two) times daily.    [provider]     VITAL SIGNS:  Blood pressure (!) 132/97, pulse (!) 110, temperature 98.5 F (36.9 C), temperature source Oral, resp. rate 16, height 5\' 1"  (1.549 m), weight 99.8 kg, SpO2 97 %.  PHYSICAL EXAMINATION:  Physical Exam  GENERAL:  58 y.o.-year-old patient lying in the bed with no acute distress.  EYES: Pupils equal, round, reactive to light and accommodation. No scleral icterus. Extraocular muscles intact.  HEENT: Head atraumatic, normocephalic. Oropharynx and nasopharynx clear. No oropharyngeal erythema, moist oral mucosa  NECK:  Supple, no jugular venous distention. No thyroid enlargement, no tenderness.  LUNGS: Bilateral wheezing.  Chest tenderness CARDIOVASCULAR: S1, S2 normal. No murmurs, rubs, or gallops.  ABDOMEN: Soft, nontender, nondistended. Bowel sounds present. No organomegaly or mass.  EXTREMITIES: No pedal edema, cyanosis, or clubbing. + 2 pedal & radial  pulses b/l.   NEUROLOGIC: Cranial nerves II through XII are intact. No focal Motor or sensory deficits appreciated b/l PSYCHIATRIC: The patient is alert and oriented x 3. Good affect.  SKIN: No obvious rash, lesion, or ulcer.   LABORATORY PANEL:   CBC Recent Labs  Lab 11/05/18 1438  WBC 13.4*  HGB 13.3  HCT 40.8  PLT 391   ------------------------------------------------------------------------------------------------------------------  Chemistries  Recent Labs  Lab 11/05/18 1438  NA 134*  K 3.4*  CL 100  CO2 24  GLUCOSE 188*  BUN 18  CREATININE 0.76  CALCIUM 9.4   ------------------------------------------------------------------------------------------------------------------  Cardiac Enzymes Recent Labs  Lab 11/05/18 1438  TROPONINI 0.07*   ------------------------------------------------------------------------------------------------------------------  RADIOLOGY:  Dg Chest 2 View  Result Date: 11/05/2018 CLINICAL DATA:  Cough and congestion for 3 weeks. EXAM: CHEST - 2 VIEW COMPARISON:  PA and lateral chest 07/27/2016. FINDINGS: The lungs are clear. Heart size is normal. No pneumothorax or pleural fluid. No bony abnormality. IMPRESSION: Negative chest. Electronically Signed   By: Inge Rise M.D.   On: 11/05/2018 14:58   Ct Angio Chest Pe W And/or Wo Contrast  Result Date: 11/05/2018 CLINICAL DATA:  Pt reports cough and congestion for the past three weeks. Cough is not productive but pt reports feeling as though there is phlegm that is not coming up when coughing. No fevers at home. Generalized body aches and fatigue over the past couple weeks. Pt reports increased fatigue and SOB when walking. Pressure in chest when ambulating as well. EXAM: CT ANGIOGRAPHY CHEST WITH CONTRAST TECHNIQUE: Multidetector CT imaging of the chest was performed using the standard protocol during bolus administration of intravenous contrast. Multiplanar CT image reconstructions and  MIPs were obtained to evaluate the vascular anatomy. CONTRAST:  12mL OMNIPAQUE IOHEXOL 350 MG/ML SOLN COMPARISON:  Current chest radiographs. FINDINGS: Cardiovascular: Satisfactory opacification of the pulmonary arteries to the segmental level. No evidence of pulmonary embolism. Normal heart size. No pericardial effusion. Minor left coronary artery calcifications. Great vessels are normal in caliber. No aortic dissection. Minimal aortic atherosclerosis. Mediastinum/Nodes: No neck base masses or adenopathy. There are prominent, but not pathologically enlarged, bilateral axillary lymph nodes. No mediastinal or hilar masses. No pathologically enlarged lymph nodes. Trachea and esophagus are unremarkable. Lungs/Pleura: Lungs are clear. No pleural effusion or pneumothorax. Upper Abdomen: No acute abnormality. Musculoskeletal: No chest wall abnormality. No acute or significant osseous findings. Review of the MIP images confirms the above findings. IMPRESSION: 1. No evidence of a pulmonary embolism. 2. No acute findings.  Lungs are clear. Aortic Atherosclerosis (ICD10-I70.0). Electronically Signed   By: Shanon Brow  Ormond M.D.   On: 11/05/2018 16:33   IMPRESSION AND PLAN:   *Acute bronchitis. -IV steroids - Scheduled Nebulizers - Inhalers -Wean O2 as tolerated - Consult pulmonary if no improvement Influenza test sent and pending  *Mild elevation in troponin of 0.07.  Likely from the bronchitis.  No EKG changes.  Chest wall tenderness.  I do not think this is acute MI.  Will repeat troponin.  Will give aspirin.  Pain medications.  *Diabetes mellitus.  Will start sliding scale insulin.  *Hypertension.  Continue hydrochlorothiazide  DVT prophylaxis with Lovenox   All the records are reviewed and case discussed with ED provider. Management plans discussed with the patient, family and they are in agreement.  CODE STATUS: Full code  TOTAL TIME TAKING CARE OF THIS PATIENT: 40 minutes.   Diane Cox M.D  on 11/05/2018 at 5:06 PM  Between 7am to 6pm - Pager - [phone]  After 6pm go to [email or web address] - password [email or web address] [facility]  Office  [phone]  CC: Primary care physician; [name], MD  Note: This dictation was prepared with Dragon dictation along with smaller phrase technology. Any transcriptional errors that result from this process are unintentional.

## 2018-11-05 NOTE — Plan of Care (Signed)
  Problem: Education: Goal: Knowledge of General Education information will improve Description Including pain rating scale, medication(s)/side effects and non-pharmacologic comfort measures Outcome: Progressing   Problem: Pain Managment: Goal: General experience of comfort will improve Outcome: Progressing   Problem: Safety: Goal: Ability to remain free from injury will improve Outcome: Progressing   Problem: Education: Goal: Knowledge of disease or condition will improve Outcome: Progressing Goal: Knowledge of the prescribed therapeutic regimen will improve Outcome: Progressing Goal: Individualized Educational Video(s) Outcome: Progressing   Problem: Respiratory: Goal: Ability to maintain a clear airway will improve Outcome: Progressing

## 2018-11-05 NOTE — ED Notes (Signed)
Gave report to [place], Therapist, sports

## 2018-11-05 NOTE — ED Notes (Signed)
Admitting at bedside 

## 2018-11-06 ENCOUNTER — Observation Stay (HOSPITAL_BASED_OUTPATIENT_CLINIC_OR_DEPARTMENT_OTHER)
Admit: 2018-11-06 | Discharge: 2018-11-06 | Disposition: A | Discharge status: Home / Self Care | Payer: BLUE CROSS/BLUE SHIELD | Attending: Internal Medicine | Admitting: Internal Medicine

## 2018-11-06 DIAGNOSIS — R0602 Shortness of breath: Secondary | ICD-10-CM | POA: Diagnosis not present

## 2018-11-06 LAB — HEMOGLOBIN A1C
Hgb A1c MFr Bld: 8.1 % — ABNORMAL HIGH (ref 4.8–5.6)
Mean Plasma Glucose: 185.77 mg/dL

## 2018-11-06 LAB — GLUCOSE, CAPILLARY
Glucose-Capillary: 306 mg/dL — ABNORMAL HIGH (ref 70–99)
Glucose-Capillary: 331 mg/dL — ABNORMAL HIGH (ref 70–99)

## 2018-11-06 LAB — ECHOCARDIOGRAM COMPLETE
Height: 61 in
Weight: 3616 oz

## 2018-11-06 MED ORDER — PREDNISONE 10 MG PO TABS: ORAL_TABLET | ORAL | 0 refills | Status: DC

## 2018-11-06 MED ORDER — ATORVASTATIN CALCIUM 20 MG PO TABS: 20.0000 mg | ORAL_TABLET | Freq: Every day | ORAL | Status: DC

## 2018-11-06 NOTE — Discharge Summary (Signed)
Diane Cox at [place] NAME: Diane Cox    MR#:  [id number]  DATE OF BIRTH:  1960/11/07  DATE OF ADMISSION:  11/05/2018 ADMITTING PHYSICIAN: Hillary Bow, MD  DATE OF DISCHARGE: 11/06/2018  1:48 PM  PRIMARY CARE PHYSICIAN: [name], MD    ADMISSION DIAGNOSIS:  Chest pain, unspecified type [R07.9]  DISCHARGE DIAGNOSIS:  Active Problems:   Acute bronchitis   SECONDARY DIAGNOSIS:   Past Medical History:  Diagnosis Date  . Anemia   . Arthritis   . Asthma   . Diabetes mellitus without complication ([place])   . Elevated lipids   . GERD (gastroesophageal reflux disease)   . IBS (irritable bowel syndrome)   . IBS (irritable bowel syndrome)   . Migraines   . Obesity   . Seasonal allergies     HOSPITAL COURSE:   58 year old female with past medical history of diabetes, IBS, migraines, asthma, chronic anemia, obesity who presented to the hospital with shortness of breath, chest pain and cough.  1.  Acute bronchitis-source of patient's pleuritic chest pain, shortness of breath and cough. - Patient was treated with IV steroids, duo nebs.  She has improved and has less cough and her pleurisy has improved.  Patient's CT chest was negative for pulmonary embolism on admission. -Chest x-ray is also negative for pneumonia.  Patient is now being discharged on oral prednisone taper.  2.  Chest pain-related to bronchitis/pleurisy.  Patient had a initial troponin that was mildly elevated 0.07 but trended down to 0.03.Marland Kitchen  EKG showed no acute ST or T wave changes.  3.  Hyperlipidemia-patient will continue her atorvastatin.  4.  Essential hypertension-patient will continue hydrochlorothiazide.  5.  History of IBS-patient will continue her hyoscyamine.  6.  Diabetes type 2 without complication-patient will continue her metformin.  7.  History of migraines-patient will continue her Topamax.  DISCHARGE CONDITIONS:   Stable.   CONSULTS  OBTAINED:    DRUG ALLERGIES:   Allergies  Allergen Reactions  . Chicken Allergy Rash  . Compazine [Prochlorperazine Edisylate] Nausea And Vomiting and Anxiety  . Pork-Derived Products Rash    DISCHARGE MEDICATIONS:   Allergies as of 11/06/2018      Reactions   Chicken Allergy Rash   Compazine [prochlorperazine Edisylate] Nausea And Vomiting, Anxiety   Pork-derived Products Rash      Medication List    TAKE these medications   albuterol 108 (90 Base) MCG/ACT inhaler Commonly known as:  PROVENTIL HFA;VENTOLIN HFA Inhale 4-6 puffs by mouth every 4 hours as needed for wheezing, cough, and/or shortness of breath   atorvastatin 20 MG tablet Commonly known as:  LIPITOR Take 20 mg by mouth daily.   benzonatate 100 MG capsule Commonly known as:  TESSALON Take by mouth 3 (three) times daily as needed for cough.   Budesonide 90 MCG/ACT inhaler Commonly known as:  PULMICORT FLEXHALER Inhale 1 puff into the lungs 2 (two) times daily.   carisoprodol 350 MG tablet Commonly known as:  SOMA Take 350 mg by mouth 3 (three) times daily between meals as needed for muscle spasms.   cholecalciferol 1000 units tablet Commonly known as:  VITAMIN D Take 2,000 Units by mouth daily.   escitalopram 10 MG tablet Commonly known as:  LEXAPRO Take 10 mg by mouth daily.   FIBER (CORN DEXTRIN) PO Take 1 tablet by mouth daily.   fluticasone 50 MCG/ACT nasal spray Commonly known as:  FLONASE Place 2 sprays into the nose  daily as needed.   gabapentin 100 MG capsule Commonly known as:  NEURONTIN Take 100-200 mg by mouth 3 (three) times daily.   hydrochlorothiazide 12.5 MG capsule Commonly known as:  MICROZIDE Take 12.5 mg by mouth daily.   hyoscyamine 0.375 MG 12 hr tablet Commonly known as:  LEVBID Take 0.375 mg by mouth 2 (two) times daily.   metFORMIN 500 MG 24 hr tablet Commonly known as:  GLUCOPHAGE-XR Take 500 mg by mouth daily.   montelukast 10 MG tablet Commonly known as:   SINGULAIR Take 10 mg by mouth daily.   pantoprazole 40 MG tablet Commonly known as:  PROTONIX Take 40 mg by mouth 2 (two) times daily.   predniSONE 10 MG tablet Commonly known as:  DELTASONE Label  & dispense according to the schedule below. 5 Pills PO for 1 day then, 4 Pills PO for 1 day, 3 Pills PO for 1 day, 2 Pills PO for 1 day, 1 Pill PO for 1 days then STOP.   PROBIOTIC DAILY PO Take 1 capsule by mouth daily.   sucralfate 1 g tablet Commonly known as:  CARAFATE Take 1 g by mouth 2 (two) times daily.   topiramate 25 MG capsule Commonly known as:  TOPAMAX Take 25 mg by mouth 2 (two) times daily.         DISCHARGE INSTRUCTIONS:   DIET:  Cardiac diet and Diabetic diet  DISCHARGE CONDITION:  Stable  ACTIVITY:  Activity as tolerated  OXYGEN:  Home Oxygen: No.   Oxygen Delivery: room air  DISCHARGE LOCATION:  home   If you experience worsening of your admission symptoms, develop shortness of breath, life threatening emergency, suicidal or homicidal thoughts you must seek medical attention immediately by calling 911 or calling your MD immediately  if symptoms less severe.  You Must read complete instructions/literature along with all the possible adverse reactions/side effects for all the Medicines you take and that have been prescribed to you. Take any new Medicines after you have completely understood and accpet all the possible adverse reactions/side effects.   Please note  You were cared for by a hospitalist during your hospital stay. If you have any questions about your discharge medications or the care you received while you were in the hospital after you are discharged, you can call the unit and asked to speak with the hospitalist on call if the hospitalist that took care of you is not available. Once you are discharged, your primary care physician will handle any further medical issues. Please note that NO REFILLS for any discharge medications will be  authorized once you are discharged, as it is imperative that you return to your primary care physician (or establish a relationship with a primary care physician if you do not have one) for your aftercare needs so that they can reassess your need for medications and monitor your lab values.     Today   This of breath, wheezing much improved.  Pleurisy is also improved.  Will discharge home today.  VITAL SIGNS:  Blood pressure 122/67, pulse 85, temperature (!) 97.5 F (36.4 C), resp. rate 17, height 5\' 1"  (1.549 m), weight 102.5 kg, SpO2 95 %.  I/O:    Intake/Output Summary (Last 24 hours) at 11/06/2018 1608 Last data filed at 11/06/2018 1338 Gross per 24 hour  Intake 363 ml  Output -  Net 363 ml    PHYSICAL EXAMINATION:  GENERAL:  58 y.o.-year-old obese patient lying in the bed with no acute  distress.  EYES: Pupils equal, round, reactive to light and accommodation. No scleral icterus. Extraocular muscles intact.  HEENT: Head atraumatic, normocephalic. Oropharynx and nasopharynx clear.  NECK:  Supple, no jugular venous distention. No thyroid enlargement, no tenderness.  LUNGS: Normal breath sounds bilaterally, no wheezing, rales,rhonchi. No use of accessory muscles of respiration.  CARDIOVASCULAR: S1, S2 normal. No murmurs, rubs, or gallops.  ABDOMEN: Soft, non-tender, non-distended. Bowel sounds present. No organomegaly or mass.  EXTREMITIES: No pedal edema, cyanosis, or clubbing.  NEUROLOGIC: Cranial nerves II through XII are intact. No focal motor or sensory defecits b/l.  PSYCHIATRIC: The patient is alert and oriented x 3.  SKIN: No obvious rash, lesion, or ulcer.   DATA REVIEW:   CBC Recent Labs  Lab 11/05/18 1438  WBC 13.4*  HGB 13.3  HCT 40.8  PLT 391    Chemistries  Recent Labs  Lab 11/05/18 1438  NA 134*  K 3.4*  CL 100  CO2 24  GLUCOSE 188*  BUN 18  CREATININE 0.76  CALCIUM 9.4    Cardiac Enzymes Recent Labs  Lab 11/05/18 1743  TROPONINI <0.03     Microbiology Results  Results for orders placed or performed during the hospital encounter of 07/19/16  Rapid Influenza A&B Antigens ([place] only)     Status: None   Collection Time: 07/19/16 12:26 PM  Result Value Ref Range Status   Influenza A ([place]) NEGATIVE NEGATIVE Final   Influenza B [facility]) NEGATIVE NEGATIVE Final    RADIOLOGY:  Dg Chest 2 View  Result Date: 11/05/2018 CLINICAL DATA:  Cough and congestion for 3 weeks. EXAM: CHEST - 2 VIEW COMPARISON:  PA and lateral chest 07/27/2016. FINDINGS: The lungs are clear. Heart size is normal. No pneumothorax or pleural fluid. No bony abnormality. IMPRESSION: Negative chest. Electronically Signed   By: Inge Rise M.D.   On: 11/05/2018 14:58   Ct Angio Chest Pe W And/or Wo Contrast  Result Date: 11/05/2018 CLINICAL DATA:  Pt reports cough and congestion for the past three weeks. Cough is not productive but pt reports feeling as though there is phlegm that is not coming up when coughing. No fevers at home. Generalized body aches and fatigue over the past couple weeks. Pt reports increased fatigue and SOB when walking. Pressure in chest when ambulating as well. EXAM: CT ANGIOGRAPHY CHEST WITH CONTRAST TECHNIQUE: Multidetector CT imaging of the chest was performed using the standard protocol during bolus administration of intravenous contrast. Multiplanar CT image reconstructions and MIPs were obtained to evaluate the vascular anatomy. CONTRAST:  66mL OMNIPAQUE IOHEXOL 350 MG/ML SOLN COMPARISON:  Current chest radiographs. FINDINGS: Cardiovascular: Satisfactory opacification of the pulmonary arteries to the segmental level. No evidence of pulmonary embolism. Normal heart size. No pericardial effusion. Minor left coronary artery calcifications. Great vessels are normal in caliber. No aortic dissection. Minimal aortic atherosclerosis. Mediastinum/Nodes: No neck base masses or adenopathy. There are prominent, but not pathologically enlarged, bilateral  axillary lymph nodes. No mediastinal or hilar masses. No pathologically enlarged lymph nodes. Trachea and esophagus are unremarkable. Lungs/Pleura: Lungs are clear. No pleural effusion or pneumothorax. Upper Abdomen: No acute abnormality. Musculoskeletal: No chest wall abnormality. No acute or significant osseous findings. Review of the MIP images confirms the above findings. IMPRESSION: 1. No evidence of a pulmonary embolism. 2. No acute findings.  Lungs are clear. Aortic Atherosclerosis (ICD10-I70.0). Electronically Signed   By: Lajean Manes M.D.   On: 11/05/2018 16:33      Management plans discussed with the  patient, family and they are in agreement.  CODE STATUS:     Code Status Orders  (From admission, onward)         Start     Ordered   11/05/18 1709  Full code  Continuous     11/05/18 1709        Code Status History    This patient has a current code status but no historical code status.      TOTAL TIME TAKING CARE OF THIS PATIENT: 40 minutes.    Henreitta Leber M.D on 11/06/2018 at 4:08 PM  Between 7am to 6pm - Pager - [phone]  After 6pm go to [email or web address] - Proofreader  [facility]  Office  [phone]  CC: Primary care physician; [name], MD

## 2018-11-06 NOTE — Progress Notes (Signed)
[place] was admitted to the Hospital on 11/05/2018 and Discharged  11/06/2018 and should be excused from work/school   for 5 days starting 11/05/2018 , may return to work/school without any restrictions.  Call Abel Presto MD, [facility]  [phone] with questions.  Henreitta Leber M.D on 11/06/2018,at 11:36 AM

## 2018-11-06 NOTE — Progress Notes (Signed)
Pt D/C to home with husband. IV removed intact. Education completed. Work note and prescription sent with pt. All belongings taken with pt and husband. All questions answered. VSS.

## 2018-11-08 LAB — HIV ANTIBODY (ROUTINE TESTING W REFLEX): HIV Screen 4th Generation wRfx: NONREACTIVE

## 2018-12-09 DIAGNOSIS — D171 Benign lipomatous neoplasm of skin and subcutaneous tissue of trunk: Secondary | ICD-10-CM | POA: Insufficient documentation

## 2019-06-17 ENCOUNTER — Emergency Department: Payer: BC Managed Care – PPO

## 2019-06-17 ENCOUNTER — Other Ambulatory Visit: Payer: Self-pay

## 2019-06-17 ENCOUNTER — Emergency Department
Admission: EM | Admit: 2019-06-17 | Discharge: 2019-06-17 | Disposition: A | Discharge status: Home / Self Care | Payer: BC Managed Care – PPO | Attending: Emergency Medicine | Admitting: Emergency Medicine

## 2019-06-17 DIAGNOSIS — E119 Type 2 diabetes mellitus without complications: Secondary | ICD-10-CM | POA: Diagnosis not present

## 2019-06-17 DIAGNOSIS — R0789 Other chest pain: Secondary | ICD-10-CM | POA: Diagnosis present

## 2019-06-17 DIAGNOSIS — J45909 Unspecified asthma, uncomplicated: Secondary | ICD-10-CM | POA: Diagnosis not present

## 2019-06-17 DIAGNOSIS — R0600 Dyspnea, unspecified: Secondary | ICD-10-CM

## 2019-06-17 DIAGNOSIS — R0602 Shortness of breath: Secondary | ICD-10-CM | POA: Diagnosis not present

## 2019-06-17 DIAGNOSIS — Z20828 Contact with and (suspected) exposure to other viral communicable diseases: Secondary | ICD-10-CM | POA: Diagnosis not present

## 2019-06-17 LAB — COMPREHENSIVE METABOLIC PANEL
ALT: 25 U/L (ref 0–44)
AST: 18 U/L (ref 15–41)
Albumin: 4.1 g/dL (ref 3.5–5.0)
Alkaline Phosphatase: 136 U/L — ABNORMAL HIGH (ref 38–126)
Anion gap: 11 (ref 5–15)
BUN: 17 mg/dL (ref 6–20)
CO2: 28 mmol/L (ref 22–32)
Calcium: 9 mg/dL (ref 8.9–10.3)
Chloride: 97 mmol/L — ABNORMAL LOW (ref 98–111)
Creatinine, Ser: 0.66 mg/dL (ref 0.44–1.00)
GFR calc Af Amer: >60 mL/min (ref >60)
GFR calc non Af Amer: >60 mL/min (ref >60)
Glucose, Bld: 256 mg/dL — ABNORMAL HIGH (ref 70–99)
Potassium: 3.3 mmol/L — ABNORMAL LOW (ref 3.5–5.1)
Sodium: 136 mmol/L (ref 135–145)
Total Bilirubin: 0.2 mg/dL — ABNORMAL LOW (ref 0.3–1.2)
Total Protein: 7.7 g/dL (ref 6.5–8.1)

## 2019-06-17 LAB — CBC
HCT: 38.1 % (ref 36.0–46.0)
Hemoglobin: 12.4 g/dL (ref 12.0–15.0)
MCH: 29.2 pg (ref 26.0–34.0)
MCHC: 32.5 g/dL (ref 30.0–36.0)
MCV: 89.6 fL (ref 80.0–100.0)
Platelets: 340 10*3/uL (ref 150–400)
RBC: 4.25 MIL/uL (ref 3.87–5.11)
RDW: 12 % (ref 11.5–15.5)
WBC: 13.3 10*3/uL — ABNORMAL HIGH (ref 4.0–10.5)
nRBC: 0 % (ref 0.0–0.2)

## 2019-06-17 LAB — SARS CORONAVIRUS 2 BY RT PCR (HOSPITAL ORDER, PERFORMED IN ~~LOC~~ HOSPITAL LAB): SARS Coronavirus 2: NEGATIVE

## 2019-06-17 LAB — TROPONIN I (HIGH SENSITIVITY): Troponin I (High Sensitivity): 2 ng/L (ref <18)

## 2019-06-17 LAB — FIBRIN DERIVATIVES D-DIMER (ARMC ONLY): Fibrin derivatives D-dimer (ARMC): 268.76 ng/mL (FEU) (ref 0.00–499.00)

## 2019-06-17 MED ORDER — AZITHROMYCIN 250 MG PO TABS: ORAL_TABLET | ORAL | 0 refills | Status: DC

## 2019-06-17 MED ORDER — PREDNISONE 50 MG PO TABS: ORAL_TABLET | ORAL | 0 refills | Status: DC

## 2019-06-17 MED ORDER — HYDROCOD POLST-CPM POLST ER 10-8 MG/5ML PO SUER: 5.0000 mL | Freq: Two times a day (BID) | ORAL | 0 refills | Status: DC

## 2019-06-17 NOTE — ED Provider Notes (Signed)
[facility] Emergency Department Provider Note       Time seen: ----------------------------------------- 9:51 AM on 06/17/2019 -----------------------------------------   I have reviewed the triage vital signs and the nursing notes.  HISTORY   Chief Complaint Chest Pain and Shortness of Breath    HPI Diane Cox is a 58 y.o. female with a history of anemia, arthritis, asthma, diabetes, GERD, IBS, migraines who presents to the ED for shortness of breath for the last month with heaviness and chest tightness for the last day.  Patient tested negative for COVID recently.  Discomfort is 6 out of 10 in her chest.  Past Medical History:  Diagnosis Date  . Anemia   . Arthritis   . Asthma   . Diabetes mellitus without complication ([place])   . Elevated lipids   . GERD (gastroesophageal reflux disease)   . IBS (irritable bowel syndrome)   . IBS (irritable bowel syndrome)   . Migraines   . Obesity   . Seasonal allergies     Patient Active Problem List   Diagnosis Date Noted  . Acute bronchitis 11/05/2018    Past Surgical History:  Procedure Laterality Date  . ABDOMINAL HYSTERECTOMY    . CHOLECYSTECTOMY    . COLONOSCOPY WITH PROPOFOL N/A 11/25/2016   Procedure: COLONOSCOPY WITH PROPOFOL;  Surgeon: Lollie Sails, MD;  Location: [facility] ENDOSCOPY;  Service: Endoscopy;  Laterality: N/A;  . ESOPHAGOGASTRODUODENOSCOPY (EGD) WITH PROPOFOL N/A 11/25/2016   Procedure: ESOPHAGOGASTRODUODENOSCOPY (EGD) WITH PROPOFOL;  Surgeon: Lollie Sails, MD;  Location: [facility] ENDOSCOPY;  Service: Endoscopy;  Laterality: N/A;  . TONSILLECTOMY      Allergies Chicken allergy, Compazine [prochlorperazine edisylate], and Pork-derived products  Social History Social History   Tobacco Use  . Smoking status: Never Smoker  . Smokeless tobacco: Never Used  Substance Use Topics  . Alcohol use: Yes    Alcohol/week: 0.0 standard drinks    Comment: occasional  . Drug use: No    Review of Systems Constitutional: Negative for fever. Cardiovascular: Positive for chest tightness Respiratory: Positive for shortness of breath Gastrointestinal: Negative for abdominal pain, vomiting and diarrhea. Musculoskeletal: Negative for back pain. Skin: Negative for rash. Neurological: Negative for headaches, focal weakness or numbness.  All systems negative/normal/unremarkable except as stated in the HPI  ____________________________________________   PHYSICAL EXAM:  VITAL SIGNS: ED Triage Vitals [06/17/19 0941]  Enc Vitals Group     BP 131/79     Pulse Rate 84     Resp 18     Temp 98.4 F (36.9 C)     Temp Source Oral     SpO2 100 %     Weight 223 lb (101.2 kg)     Height 5\' 1"  (1.549 m)     Head Circumference      Peak Flow      Pain Score 6     Pain Loc      Pain Edu?      Excl. in [place]?    Constitutional: Alert and oriented. Well appearing and in no distress. Eyes: Conjunctivae are normal. Normal extraocular movements. Cardiovascular: Normal rate, regular rhythm. No murmurs, rubs, or gallops. Respiratory: Normal respiratory effort without tachypnea nor retractions. Breath sounds are clear and equal bilaterally. No wheezes/rales/rhonchi. Gastrointestinal: Soft and nontender. Normal bowel sounds Musculoskeletal: Nontender with normal range of motion in extremities. No lower extremity tenderness nor edema. Neurologic:  Normal speech and language. No gross focal neurologic deficits are appreciated.  Skin:  Skin is warm,  dry and intact. No rash noted. Psychiatric: Mood and affect are normal. Speech and behavior are normal.  ____________________________________________  EKG: Interpreted by me.  Sinus rhythm with a rate of 89 bpm, low voltage, nonspecific ST segment changes, normal QT  ____________________________________________  ED COURSE:  As part of my medical decision making, I reviewed the following data within the [place] History  obtained from family if available, nursing notes, old chart and ekg, as well as notes from prior ED visits. Patient presented for chest pain and shortness of breath, we will assess with labs and imaging as indicated at this time.   Procedures  Diane Cox was evaluated in Emergency Department on 06/17/2019 for the symptoms described in the history of present illness. She was evaluated in the context of the global COVID-19 pandemic, which necessitated consideration that the patient might be at risk for infection with the SARS-CoV-2 virus that causes COVID-19. Institutional protocols and algorithms that pertain to the evaluation of patients at risk for COVID-19 are in a state of rapid change based on information released by regulatory bodies including the CDC and federal and state organizations. These policies and algorithms were followed during the patient's care in the ED.  ____________________________________________   LABS (pertinent positives/negatives)  Labs Reviewed  CBC - Abnormal; Notable for the following components:      Result Value   WBC 13.3 (*)    All other components within normal limits  COMPREHENSIVE METABOLIC PANEL - Abnormal; Notable for the following components:   Potassium 3.3 (*)    Chloride 97 (*)    Glucose, Bld 256 (*)    Alkaline Phosphatase 136 (*)    Total Bilirubin 0.2 (*)    All other components within normal limits  SARS CORONAVIRUS 2 (HOSPITAL ORDER, [place] [facility])  FIBRIN DERIVATIVES D-DIMER ([facility] ONLY)  TROPONIN I (HIGH SENSITIVITY)    RADIOLOGY Images were viewed by me  Chest x-ray IMPRESSION: No active disease. ____________________________________________   DIFFERENTIAL DIAGNOSIS   URI, pneumonia, COVID-19, PE, MI  FINAL ASSESSMENT AND PLAN  Chest pain, shortness of breath   Plan: The patient had presented for chest pain and shortness of breath. Patient's labs did reveal mild leukocytosis without other specific  etiology.  She reports her blood sugar sometimes runs in the 200s as it is today. Patient's imaging was negative.  She will be discharged with steroids, I will try a Z-Pak and cough medication due to her persistent symptoms.   Laurence Aly, MD    Note: This note was generated in part or whole with voice recognition software. Voice recognition is usually quite accurate but there are transcription errors that can and very often do occur. I apologize for any typographical errors that were not detected and corrected.     Earleen Newport, MD 06/17/19 1150

## 2019-06-17 NOTE — ED Notes (Signed)
Pt present to the ED with [facility] and chest pain, states it "feels like a tight band around her chest". SHOB started 2 weeks ago, chest pain started yesterday and worsened this morning.

## 2019-06-17 NOTE — ED Triage Notes (Signed)
Reports SOB X 1 month, heaviness and chest tightness X 1 days. Pt recently tested negative for COVID. Pt alert and oriented X4, active, cooperative, pt in NAD. RR even and unlabored, color WNL.

## 2019-10-19 ENCOUNTER — Other Ambulatory Visit: Payer: Self-pay | Admitting: Family Medicine

## 2019-10-19 DIAGNOSIS — Z1231 Encounter for screening mammogram for malignant neoplasm of breast: Secondary | ICD-10-CM

## 2019-11-01 ENCOUNTER — Ambulatory Visit
Admission: RE | Admit: 2019-11-01 | Discharge: 2019-11-01 | Disposition: A | Discharge status: Home / Self Care | Payer: BC Managed Care – PPO | Source: Ambulatory Visit | Attending: Family Medicine | Admitting: Family Medicine

## 2019-11-01 DIAGNOSIS — Z1231 Encounter for screening mammogram for malignant neoplasm of breast: Secondary | ICD-10-CM | POA: Diagnosis not present

## 2019-11-07 ENCOUNTER — Other Ambulatory Visit: Payer: Self-pay | Admitting: Family Medicine

## 2019-11-07 DIAGNOSIS — R928 Other abnormal and inconclusive findings on diagnostic imaging of breast: Secondary | ICD-10-CM

## 2019-11-07 DIAGNOSIS — R921 Mammographic calcification found on diagnostic imaging of breast: Secondary | ICD-10-CM

## 2019-11-10 ENCOUNTER — Ambulatory Visit
Admission: RE | Admit: 2019-11-10 | Discharge: 2019-11-10 | Disposition: A | Discharge status: Home / Self Care | Payer: BC Managed Care – PPO | Source: Ambulatory Visit | Attending: Family Medicine | Admitting: Family Medicine

## 2019-11-10 DIAGNOSIS — R928 Other abnormal and inconclusive findings on diagnostic imaging of breast: Secondary | ICD-10-CM

## 2019-11-10 DIAGNOSIS — R921 Mammographic calcification found on diagnostic imaging of breast: Secondary | ICD-10-CM

## 2019-11-15 ENCOUNTER — Other Ambulatory Visit: Payer: Self-pay | Admitting: Family Medicine

## 2019-11-15 DIAGNOSIS — R921 Mammographic calcification found on diagnostic imaging of breast: Secondary | ICD-10-CM

## 2019-11-15 DIAGNOSIS — R928 Other abnormal and inconclusive findings on diagnostic imaging of breast: Secondary | ICD-10-CM

## 2019-11-17 ENCOUNTER — Ambulatory Visit
Admission: RE | Admit: 2019-11-17 | Discharge: 2019-11-17 | Disposition: A | Discharge status: Home / Self Care | Payer: BC Managed Care – PPO | Source: Ambulatory Visit | Attending: Family Medicine | Admitting: Family Medicine

## 2019-11-17 DIAGNOSIS — R921 Mammographic calcification found on diagnostic imaging of breast: Secondary | ICD-10-CM

## 2019-11-17 DIAGNOSIS — R928 Other abnormal and inconclusive findings on diagnostic imaging of breast: Secondary | ICD-10-CM

## 2019-11-17 HISTORY — PX: BREAST BIOPSY: SHX20

## 2019-11-18 ENCOUNTER — Other Ambulatory Visit: Payer: Self-pay | Admitting: Anatomic Pathology & Clinical Pathology

## 2019-11-21 DIAGNOSIS — D0511 Intraductal carcinoma in situ of right breast: Secondary | ICD-10-CM

## 2019-11-21 LAB — SURGICAL PATHOLOGY

## 2019-11-21 NOTE — Progress Notes (Signed)
Introductory call to patient to initiate  Navigation Service.  Scheduled Surgical consult with Dr. Peyton Najjar, and Medical/Oncology Consult with Dr. Grayland Ormond.  Patient has family history of breast , uterine, and colon cancer.  Discussed possibility of genetic testing.  Notified research nurses of possible eligibility for COMET trial.

## 2019-11-23 ENCOUNTER — Other Ambulatory Visit: Payer: Self-pay

## 2019-11-23 DIAGNOSIS — K219 Gastro-esophageal reflux disease without esophagitis: Secondary | ICD-10-CM | POA: Insufficient documentation

## 2019-11-23 DIAGNOSIS — E785 Hyperlipidemia, unspecified: Secondary | ICD-10-CM | POA: Insufficient documentation

## 2019-11-23 DIAGNOSIS — J45909 Unspecified asthma, uncomplicated: Secondary | ICD-10-CM | POA: Insufficient documentation

## 2019-11-23 DIAGNOSIS — I10 Essential (primary) hypertension: Secondary | ICD-10-CM | POA: Insufficient documentation

## 2019-11-24 ENCOUNTER — Encounter: Payer: Self-pay | Admitting: Oncology

## 2019-11-24 ENCOUNTER — Inpatient Hospital Stay: Payer: BC Managed Care – PPO | Attending: Oncology | Admitting: Oncology

## 2019-11-24 ENCOUNTER — Other Ambulatory Visit: Payer: Self-pay

## 2019-11-24 VITALS — BP 130/94 | HR 84 | Temp 98.1°F | Ht 61.0 in | Wt 223.0 lb

## 2019-11-24 DIAGNOSIS — D0511 Intraductal carcinoma in situ of right breast: Secondary | ICD-10-CM

## 2019-11-24 DIAGNOSIS — Z8249 Family history of ischemic heart disease and other diseases of the circulatory system: Secondary | ICD-10-CM

## 2019-11-24 DIAGNOSIS — J45909 Unspecified asthma, uncomplicated: Secondary | ICD-10-CM

## 2019-11-24 DIAGNOSIS — Z8049 Family history of malignant neoplasm of other genital organs: Secondary | ICD-10-CM

## 2019-11-24 DIAGNOSIS — E119 Type 2 diabetes mellitus without complications: Secondary | ICD-10-CM

## 2019-11-24 DIAGNOSIS — K219 Gastro-esophageal reflux disease without esophagitis: Secondary | ICD-10-CM | POA: Diagnosis not present

## 2019-11-24 DIAGNOSIS — K589 Irritable bowel syndrome without diarrhea: Secondary | ICD-10-CM | POA: Insufficient documentation

## 2019-11-24 DIAGNOSIS — Z170 Estrogen receptor positive status [ER+]: Secondary | ICD-10-CM | POA: Diagnosis not present

## 2019-11-24 DIAGNOSIS — E669 Obesity, unspecified: Secondary | ICD-10-CM

## 2019-11-24 DIAGNOSIS — Z79899 Other long term (current) drug therapy: Secondary | ICD-10-CM | POA: Diagnosis not present

## 2019-11-24 DIAGNOSIS — Z803 Family history of malignant neoplasm of breast: Secondary | ICD-10-CM | POA: Insufficient documentation

## 2019-11-24 DIAGNOSIS — Z7951 Long term (current) use of inhaled steroids: Secondary | ICD-10-CM | POA: Diagnosis not present

## 2019-11-24 DIAGNOSIS — Z7984 Long term (current) use of oral hypoglycemic drugs: Secondary | ICD-10-CM | POA: Diagnosis not present

## 2019-11-24 NOTE — Progress Notes (Signed)
New patient visit with DCIS of right breast.

## 2019-11-25 ENCOUNTER — Ambulatory Visit: Payer: BC Managed Care – PPO | Admitting: Oncology

## 2019-11-25 DIAGNOSIS — D0511 Intraductal carcinoma in situ of right breast: Secondary | ICD-10-CM | POA: Insufficient documentation

## 2019-11-25 NOTE — Progress Notes (Signed)
[place]  [phone] [phone]  ID: Diane Cox OB: [birth year]  MR#: [id number]  AOZ#:[id number]  Patient Care Team: [name], MD as PCP - General (Family Medicine) Theodore Demark, RN as Registered Nurse (Oncology) Rico Junker, RN as Registered Nurse (Oncology)  CHIEF COMPLAINT: Right breast DCIS  INTERVAL HISTORY: Patient is a 58 year old female who was noted to have an abnormality on routine screening mammogram.  Subsequent ultrasound biopsy revealed the above-stated DCIS.  Patient currently feels well and is asymptomatic.  She has no neurologic complaints.  She denies any recent fevers or illnesses.  She has a good appetite and denies weight loss.  She has no chest pain, shortness of breath, cough, or hemoptysis.  She denies any nausea, vomiting, constipation, or diarrhea.  She has no urinary complaints.  Patient feels at her baseline and offers no specific complaints today.  REVIEW OF SYSTEMS:   Review of Systems  Constitutional: Negative.  Negative for fever, malaise/fatigue and weight loss.  Respiratory: Negative.  Negative for cough, hemoptysis and shortness of breath.   Cardiovascular: Negative.  Negative for chest pain and leg swelling.  Gastrointestinal: Negative.  Negative for abdominal pain.  Genitourinary: Negative.  Negative for dysuria.  Musculoskeletal: Negative.  Negative for back pain.  Skin: Negative.  Negative for rash.  Neurological: Negative.  Negative for dizziness, focal weakness, weakness and headaches.  Psychiatric/Behavioral: Negative.  The patient is not nervous/anxious.     As per HPI. Otherwise, a complete review of systems is negative.  PAST MEDICAL HISTORY: Past Medical History:  Diagnosis Date   Anemia    Arthritis    Asthma    Diabetes mellitus without complication (HCC)    Elevated lipids    GERD (gastroesophageal reflux disease)    IBS (irritable bowel syndrome)    IBS  (irritable bowel syndrome)    Migraines    Obesity    Seasonal allergies     PAST SURGICAL HISTORY: Past Surgical History:  Procedure Laterality Date   ABDOMINAL HYSTERECTOMY     BREAST BIOPSY Right 11/17/2019   affirm bx, coil marker, path pending   CHOLECYSTECTOMY     COLONOSCOPY WITH PROPOFOL N/A 11/25/2016   Procedure: COLONOSCOPY WITH PROPOFOL;  Surgeon: Lollie Sails, MD;  Location: [facility] ENDOSCOPY;  Service: Endoscopy;  Laterality: N/A;   ESOPHAGOGASTRODUODENOSCOPY (EGD) WITH PROPOFOL N/A 11/25/2016   Procedure: ESOPHAGOGASTRODUODENOSCOPY (EGD) WITH PROPOFOL;  Surgeon: Lollie Sails, MD;  Location: [facility] ENDOSCOPY;  Service: Endoscopy;  Laterality: N/A;   TONSILLECTOMY      FAMILY HISTORY: Family History  Problem Relation Age of Onset   Heart attack Father    Uterine cancer Mother    Breast cancer Maternal Aunt    Uterine cancer Maternal Grandmother     ADVANCED DIRECTIVES (Y/N):  N  HEALTH MAINTENANCE: Social History   Tobacco Use   Smoking status: Never Smoker   Smokeless tobacco: Never Used  Substance Use Topics   Alcohol use: Yes    Alcohol/week: 0.0 standard drinks    Comment: occasional   Drug use: No     Colonoscopy:  PAP:  Bone density:  Lipid panel:  Allergies  Allergen Reactions   Chicken Allergy Rash   Compazine [Prochlorperazine Edisylate] Nausea And Vomiting and Anxiety   Pork-Derived Products Rash    Current Outpatient Medications  Medication Sig Dispense Refill   albuterol (PROVENTIL HFA;VENTOLIN HFA) 108 (90 BASE) MCG/ACT inhaler Inhale 4-6 puffs by mouth every 4 hours as needed  for wheezing, cough, and/or shortness of breath 1 Inhaler 1   atorvastatin (LIPITOR) 20 MG tablet Take 20 mg by mouth at bedtime.      benzonatate (TESSALON) 100 MG capsule Take by mouth 3 (three) times daily as needed for cough.     BREO ELLIPTA 100-25 MCG/INH AEPB Inhale 1 puff into the lungs daily at 8 pm.     carisoprodol  (SOMA) 350 MG tablet Take 350 mg by mouth 3 (three) times daily between meals as needed for muscle spasms.     cholecalciferol (VITAMIN D) 1000 UNITS tablet Take 2,000 Units by mouth daily.     dicyclomine (BENTYL) 10 MG capsule Take 10 mg by mouth 3 (three) times daily as needed for spasms (IVS).     escitalopram (LEXAPRO) 10 MG tablet Take 10 mg by mouth daily.      FIBER, CORN DEXTRIN, PO Take 1 tablet by mouth daily.     fluticasone (FLONASE) 50 MCG/ACT nasal spray Place 2 sprays into the nose daily as needed.     gabapentin (NEURONTIN) 100 MG capsule Take 200 mg by mouth daily as needed.      glipiZIDE (GLUCOTROL XL) 5 MG 24 hr tablet Take by mouth.     hydrochlorothiazide (MICROZIDE) 12.5 MG capsule Take 12.5 mg by mouth daily.     Magnesium 250 MG TABS Take by mouth.     montelukast (SINGULAIR) 10 MG tablet Take 10 mg by mouth daily.      pantoprazole (PROTONIX) 40 MG tablet Take 40 mg by mouth daily.      Potassium 99 MG TABS Take by mouth.     Probiotic Product (MISC INTESTINAL FLORA REGULAT) CHEW Chew 2 each by mouth daily at 8 pm.     sucralfate (CARAFATE) 1 G tablet Take 1 g by mouth 2 (two) times daily.      topiramate (TOPAMAX) 25 MG tablet Take 25 mg by mouth 2 (two) times daily.     No current facility-administered medications for this visit.    OBJECTIVE: Vitals:   11/24/19 1146  BP: (!) 130/94  Pulse: 84  Temp: 98.1 F (36.7 C)     Body mass index is 42.14 kg/m.    ECOG FS:0 - Asymptomatic  General: Well-developed, well-nourished, no acute distress. Eyes: Pink conjunctiva, anicteric sclera. HEENT: Normocephalic, moist mucous membranes. Breast: Exam deferred today. Lungs: No audible wheezing or coughing. Heart: Regular rate and rhythm. Abdomen: Soft, nontender, no obvious distention. Musculoskeletal: No edema, cyanosis, or clubbing. Neuro: Alert, answering all questions appropriately. Cranial nerves grossly intact. Skin: No rashes or petechiae  noted. Psych: Normal affect. Lymphatics: No cervical, calvicular, axillary or inguinal LAD.   LAB RESULTS:  Lab Results  Component Value Date   NA 136 06/17/2019   K 3.3 (L) 06/17/2019   CL 97 (L) 06/17/2019   CO2 28 06/17/2019   GLUCOSE 256 (H) 06/17/2019   BUN 17 06/17/2019   CREATININE 0.66 06/17/2019   CALCIUM 9.0 06/17/2019   PROT 7.7 06/17/2019   ALBUMIN 4.1 06/17/2019   AST 18 06/17/2019   ALT 25 06/17/2019   ALKPHOS 136 (H) 06/17/2019   BILITOT 0.2 (L) 06/17/2019   GFRNONAA >60 06/17/2019   GFRAA >60 06/17/2019    Lab Results  Component Value Date   WBC 13.3 (H) 06/17/2019   HGB 12.4 06/17/2019   HCT 38.1 06/17/2019   MCV 89.6 06/17/2019   PLT 340 06/17/2019     STUDIES: MM Digital Diagnostic Unilat R  Result Date: 11/10/2019 CLINICAL DATA:  Calcifications in the outer right breast on a recent screening mammogram. EXAM: DIGITAL DIAGNOSTIC RIGHT MAMMOGRAM WITH CAD COMPARISON:  Previous exam(s). ACR Breast Density Category c: The breast tissue is heterogeneously dense, which may obscure small masses. FINDINGS: 3D tomographic and 2D generated true lateral and 2D spot magnification craniocaudal and true lateral views of the right breast were obtained. These demonstrate a 9 x 3 x 2 mm group of a large number of small, pleomorphic calcifications in the outer aspect of the breast in the middle 3rd, in the 9 o'clock position. Mammographic images were processed with CAD. IMPRESSION: 9 mm group of indeterminate calcifications in the 9 o'clock position of the right breast. RECOMMENDATION: 3D stereotactic guided core needle biopsy of the 9 mm group of calcifications in the 9 o'clock position of the right breast. This has been discussed with the patient and will be scheduled in consultation with the referring provider. I have discussed the findings and recommendations with the patient. If applicable, a reminder letter will be sent to the patient regarding the next appointment.  BI-RADS CATEGORY  4: Suspicious. Electronically Signed   By: Claudie Revering M.D.   On: 11/10/2019 11:22   MM 3D SCREEN BREAST BILATERAL  Result Date: 11/01/2019 CLINICAL DATA:  Screening. EXAM: DIGITAL SCREENING BILATERAL MAMMOGRAM WITH TOMO AND CAD COMPARISON:  Previous exam(s). ACR Breast Density Category c: The breast tissue is heterogeneously dense, which may obscure small masses. FINDINGS: In the right breast, calcifications warrant further evaluation with magnified views. In the left breast, no findings suspicious for malignancy. Images were processed with CAD. IMPRESSION: Further evaluation is suggested for calcifications in the right breast. RECOMMENDATION: Diagnostic mammogram of the right breast. (Code:[id number]) The patient will be contacted regarding the findings, and additional imaging will be scheduled. BI-RADS CATEGORY  0: Incomplete. Need additional imaging evaluation and/or prior mammograms for comparison. Electronically Signed   By: Ammie Ferrier M.D.   On: 11/01/2019 15:01   MM CLIP PLACEMENT RIGHT  Result Date: 11/17/2019 CLINICAL DATA:  Post biopsy mammogram of right breast for clip placement. EXAM: DIAGNOSTIC RIGHT MAMMOGRAM POST STEREOTACTIC BIOPSY COMPARISON:  Previous exam(s). FINDINGS: Mammographic images were obtained following stereotactic guided biopsy of calcifications in the lateral right. The biopsy marking clip is in expected position at the site of biopsy. IMPRESSION: Appropriate positioning of the coil shaped biopsy marking clip at the site of biopsy in the lateral right breast. Final Assessment: Post Procedure Mammograms for Marker Placement Electronically Signed   By: Ammie Ferrier M.D.   On: 11/17/2019 10:16   MM RT BREAST BX W LOC DEV 1ST LESION IMAGE BX SPEC STEREO GUIDE  Addendum Date: 11/23/2019   ADDENDUM REPORT: 11/23/2019 13:20 ADDENDUM: PATHOLOGY revealed: A. BREAST, RIGHT LATERAL; STEREOTACTIC CORE BIOPSY: - DUCTAL CARCINOMA IN SITU, INTERMEDIATE  GRADE, WITH CALCIFICATIONS. - NEGATIVE FOR INVASIVE CARCINOMA. Comment: DCIS measures at least 4 mm in greatest extent, and is present in 4 of multiple core biopsies. Pathology results are CONCORDANT with imaging findings, per Dr. Ammie Ferrier. Pathology results and recommendations were discussed with patient by telephone on 11/18/2019. Patient reported biopsy site doing well with slight tenderness at the site. Post biopsy care instructions were reviewed and questions were answered. Patient was instructed to call [facility] if any concerns or questions arise related to the biopsy. Recommendation: Request for surgical referral was relayed to nurse navigators at [facility] by Electa Sniff RN on 11/22/2019. Addendum by Electa Sniff  RN on 11/23/2019. Electronically Signed   By: Ammie Ferrier M.D.   On: 11/23/2019 13:20   Result Date: 11/23/2019 CLINICAL DATA:  59 year old female presenting for stereotactic biopsy of right breast calcifications. EXAM: ULTRASOUND GUIDED RIGHT BREAST CORE NEEDLE BIOPSY COMPARISON:  Previous exam(s). FINDINGS: I met with the patient and we discussed the procedure of ultrasound-guided biopsy, including benefits and alternatives. We discussed the high likelihood of a successful procedure. We discussed the risks of the procedure, including infection, bleeding, tissue injury, clip migration, and inadequate sampling. Informed written consent was given. The usual time-out protocol was performed immediately prior to the procedure. Lesion quadrant: Upper-outer quadrant Using sterile technique and 1% Lidocaine as local anesthetic, under direct ultrasound visualization, a 14 gauge spring-loaded device was used to perform biopsy of calcifications in the lateral right breast using a lateral approach. At the conclusion of the procedure coil shaped tissue marker clip was deployed into the biopsy cavity. Follow up 2 view mammogram was performed and dictated separately.  IMPRESSION: Ultrasound guided biopsy of calcifications in the lateral right breast. No apparent complications. Electronically Signed: By: Ammie Ferrier M.D. On: 11/17/2019 08:46    ASSESSMENT: Right breast DCIS.  PLAN:    1.  Right breast DCIS: Patient noted to have no invasive component on biopsy.  We briefly discussed enrollment in the COMET trial, but patient states given her family history she wishes to pursue standard of care of surgery followed by XRT.  Despite this, she agreed to read over the consent form and call clinic with any questions.  Patient had consultation with surgery yesterday and plans to proceed with lumpectomy.  Given her family history, patient also qualifies for genetic testing.  If positive, she may consider prophylactic bilateral mastectomy.  Once patient completes her surgery, she will benefit from adjuvant XRT and tamoxifen for total 5 years.  Return to clinic 1 to 2 weeks after her surgery to discuss final pathology results and additional treatment planning. 2.  Family history: Referral has been sent to genetics for further evaluation.  I spent a total of 60 minutes reviewing chart data, face-to-face evaluation with the patient, counseling and coordination of care as detailed above.   Patient expressed understanding and was in agreement with this plan. She also understands that She can call clinic at any time with any questions, concerns, or complaints.   Cancer Staging No matching staging information was found for the patient.  Lloyd Huger, MD   11/25/2019 9:46 AM

## 2019-11-30 ENCOUNTER — Telehealth: Payer: Self-pay | Admitting: Licensed Clinical Social Worker

## 2019-11-30 ENCOUNTER — Inpatient Hospital Stay: Payer: BC Managed Care – PPO

## 2019-11-30 ENCOUNTER — Other Ambulatory Visit: Payer: Self-pay

## 2019-11-30 NOTE — Telephone Encounter (Signed)
Made appointment for Diane Cox to see me in genetics on 2/1 at 2 pm. She will have blood drawn for genetics today, 1/27.

## 2019-12-05 ENCOUNTER — Encounter: Payer: Self-pay | Admitting: Licensed Clinical Social Worker

## 2019-12-05 ENCOUNTER — Inpatient Hospital Stay: Payer: BC Managed Care – PPO | Attending: Genetic Counselor | Admitting: Licensed Clinical Social Worker

## 2019-12-05 DIAGNOSIS — D0511 Intraductal carcinoma in situ of right breast: Secondary | ICD-10-CM

## 2019-12-05 DIAGNOSIS — Z8049 Family history of malignant neoplasm of other genital organs: Secondary | ICD-10-CM | POA: Diagnosis not present

## 2019-12-05 DIAGNOSIS — Z803 Family history of malignant neoplasm of breast: Secondary | ICD-10-CM | POA: Diagnosis not present

## 2019-12-05 DIAGNOSIS — Z800 Family history of malignant neoplasm of digestive organs: Secondary | ICD-10-CM | POA: Diagnosis not present

## 2019-12-05 NOTE — Progress Notes (Signed)
REFERRING PROVIDER: Lloyd Huger, MD [address] [place],  [place] [id number]  PRIMARY PROVIDER:  [name], MD  PRIMARY REASON FOR VISIT:  1. Ductal carcinoma in situ (DCIS) of right breast   2. Family history of uterine cancer   3. Family history of breast cancer   4. Family history of colon cancer    I connected with Diane Cox on 12/05/2019 at 2:00 PM EDT by MyChart video and verified that I am speaking with the correct person using two identifiers.    Patient location: home Provider location: clinic  HISTORY OF PRESENT ILLNESS:   Diane Cox, a 59 y.o. female, was seen for a [place] cancer genetics consultation at the request of Dr. Grayland Ormond due to a personal and family history of cancer.  Diane Cox presents to clinic today to discuss the possibility of a hereditary predisposition to cancer, genetic testing, and to further clarify her future cancer risks, as well as potential cancer risks for family members.   In 2021, at the age of 52, Diane Cox was diagnosed with DCIS of the right breast. The current treatment plan includes lumpectomy, adjuvant radiation and antiestrogens.  CANCER HISTORY:  Oncology History  Ductal carcinoma in situ (DCIS) of right breast  11/25/2019 Initial Diagnosis   Ductal carcinoma in situ (DCIS) of right breast   11/25/2019 Cancer Staging   Staging form: Breast, AJCC 8th Edition - Clinical stage from 11/25/2019: Stage 0 (cTis (DCIS), cN0, cM0, ER+, PR+, HER2-) - Signed by Lloyd Huger, MD on 11/25/2019      RISK FACTORS:  Menarche was at age 89.  First live birth at age no children.  OCP use: yes Ovaries intact: yes.  Hysterectomy: no, hysterectomy in 2014.  Menopausal status: postmenopausal.  HRT use: 0 years. Colonoscopy: yes; a few polyps. Mammogram within the last year: yes. Number of breast biopsies: 1. Any excessive radiation exposure in the past: no  Past Medical History:  Diagnosis Date  . Anemia   . Arthritis    . Asthma   . Diabetes mellitus without complication ([place])   . Elevated lipids   . Family history of breast cancer   . Family history of colon cancer   . Family history of uterine cancer   . GERD (gastroesophageal reflux disease)   . IBS (irritable bowel syndrome)   . IBS (irritable bowel syndrome)   . Migraines   . Obesity   . Seasonal allergies     Past Surgical History:  Procedure Laterality Date  . ABDOMINAL HYSTERECTOMY    . BREAST BIOPSY Right 11/17/2019   affirm bx, coil marker, path pending  . CHOLECYSTECTOMY    . COLONOSCOPY WITH PROPOFOL N/A 11/25/2016   Procedure: COLONOSCOPY WITH PROPOFOL;  Surgeon: Lollie Sails, MD;  [facility]COPY;  Service: Endoscopy;  Laterality: N/A;  . ESOPHAGOGASTRODUODENOSCOPY (EGD) WITH PROPOFOL N/A 11/25/2016   Procedure: ESOPHAGOGASTRODUODENOSCOPY (EGD) WITH PROPOFOL;  Surgeon: Lollie Sails, MD;  Location: [facility] ENDOSCOPY;  Service: Endoscopy;  Laterality: N/A;  . TONSILLECTOMY      Social History   Socioeconomic History  . Marital status: Married    Spouse name: Not on file  . Number of children: Not on file  . Years of education: Not on file  . Highest education level: Not on file  Occupational History  . Not on file  Tobacco Use  . Smoking status: Never Smoker  . Smokeless tobacco: Never Used  Substance and Sexual Activity  .  Alcohol use: Yes    Alcohol/week: 0.0 standard drinks    Comment: occasional  . Drug use: No  . Sexual activity: Yes  Other Topics Concern  . Not on file  Social History Narrative  . Not on file   Social Determinants of Health   Financial Resource Strain:   . Difficulty of Paying Living Expenses: Not on file  Food Insecurity:   . Worried About Charity fundraiser in the Last Year: Not on file  . Ran Out of Food in the Last Year: Not on file  Transportation Needs:   . Lack of Transportation (Medical): Not on file  . Lack of Transportation (Non-Medical): Not on file  Physical  Activity:   . Days of Exercise per Week: Not on file  . Minutes of Exercise per Session: Not on file  Stress:   . Feeling of Stress : Not on file  Social Connections:   . Frequency of Communication with Friends and Family: Not on file  . Frequency of Social Gatherings with Friends and Family: Not on file  . Attends Religious Services: Not on file  . Active Member of Clubs or Organizations: Not on file  . Attends Archivist Meetings: Not on file  . Marital Status: Not on file     FAMILY HISTORY:  We obtained a detailed, 4-generation family history.  Significant diagnoses are listed below: Family History  Problem Relation Age of Onset  . Heart attack Father   . Uterine cancer Mother        dx 56s  . Breast cancer Maternal Aunt        dx 63s, recurrence in 31s  . Uterine cancer Maternal Grandmother        dx 50s-60s  . Uterine cancer Sister        dx 16s  . Bone cancer Brother        dx 51s  . Colon cancer Maternal Aunt   . Stomach cancer Maternal Aunt   . Throat cancer Maternal Aunt   . Colon cancer Cousin    Diane Cox does not have children. She has 2 sisters and 1 brother. Her sister had endometrial cancer in her 60s and is living at 60. Her brother had bone cancer in his 58s, possibly due to exposures in the [employer].   Diane Cox mother was diagnosed with uterine cancer in her 97s. She also had precancerous cells in her breasts and had a double mastectomy in the 1980s, patient is not sure why. She passed from Alzheimer's at 31. Patient had 4 maternal aunts and 3 maternal uncles. One aunt had breast cancer in her 34s and a recurrence in her 67s. Another aunt had colon cancer, unsure age of diagnosis. Another aunt had throat cancer and the final aunt had stomach cancer. A maternal cousin (daughter of the aunt with colon cancer) also had colon cancer and is deceased. Maternal grandmother had uterine cancer in her 71s-60s and died in her early 39s. Maternal grandfather  passed in his 56s.  Diane Cox father died in his 12s due to a heart attack. Patient had 1 paternal aunt and 1 paternal uncle, no cancers. She has a paternal cousin who might have had eye cancer. Both paternal grandparents passed in their 16s, no cancers.  Diane Cox is unaware of previous family history of genetic testing for hereditary cancer risks. Patient's maternal ancestors are of European descent, and paternal ancestors are of Italian/[place] descent. There is no reported  Ashkenazi Jewish ancestry. There is no known consanguinity.  GENETIC COUNSELING ASSESSMENT: Diane Cox is a 59 y.o. female with a personal and family history which is somewhat suggestive of a hereditary cancer syndrome and predisposition to cancer. We, therefore, discussed and recommended the following at today's visit.   DISCUSSION: We discussed that 5 - 10% of breast cancer is hereditary, with most cases associated with BRCA1/BRCA2 mutations.  There are other genes that can be associated with hereditary breast cancer syndromes.We also discussed Lynch syndrome given her family history of colon and uterine cancer. We discussed that testing is beneficial for several reasons including surgical decision-making for breast cancer, knowing how to follow individuals after completing their treatment, and understand if other family members could be at risk for cancer and allow them to undergo genetic testing. She does not believe she would change her mind about her lumpectomy but still would like to have results back as soon as possible.   We reviewed the characteristics, features and inheritance patterns of hereditary cancer syndromes. We also discussed genetic testing, including the appropriate family members to test, the process of testing, insurance coverage and turn-around-time for results. We discussed the implications of a negative, positive and/or variant of uncertain significant result. In order to get genetic test results in a  timely manner so that Diane Cox can use these genetic test results for surgical decisions, we recommended Diane Cox pursue genetic testing for the [facility] Breast Cancer STAT Panel. Once complete, we recommend Diane Cox pursue reflex genetic testing to the Common Hereditary Cancers gene panel.   The STAT Breast cancer panel offered by [facility] includes sequencing and rearrangement analysis for the following 9 genes:  ATM, BRCA1, BRCA2, CDH1, CHEK2, PALB2, PTEN, STK11 and TP53.    The Common Hereditary Cancers Panel offered by [facility] includes sequencing and/or deletion duplication testing of the following 48 genes: APC, ATM, AXIN2, BARD1, BMPR1A, BRCA1, BRCA2, BRIP1, CDH1, CDKN2A (p14ARF), CDKN2A (p16INK4a), CKD4, CHEK2, CTNNA1, DICER1, EPCAM (Deletion/duplication testing only), GREM1 (promoter region deletion/duplication testing only), KIT, MEN1, MLH1, MSH2, MSH3, MSH6, MUTYH, NBN, NF1, NHTL1, PALB2, PDGFRA, PMS2, POLD1, POLE, PTEN, RAD50, RAD51C, RAD51D, RNF43, SDHB, SDHC, SDHD, SMAD4, SMARCA4. STK11, TP53, TSC1, TSC2, and VHL.  The following genes were evaluated for sequence changes only: SDHA and HOXB13 c.251G>A variant only.  Based on Ms. Bevill's personal and family history of cancer, she meets medical criteria for genetic testing. Despite that she meets criteria, she may still have an out of pocket cost.   PLAN: After considering the risks, benefits, and limitations, Diane Cox provided informed consent to pursue genetic testing and the blood sample was sent to [facility] for analysis of the STAT Breast Cancer Panel + Common Hereditary Cancers Panel. Results should be available within approximately 1 weeks' time, at which point they will be disclosed by telephone to Diane Cox, as will any additional recommendations warranted by these results. Ms. Botkins will receive a summary of her genetic counseling visit and a copy of her results once available. This information will also be available in Epic.    Based on Diane Cox's family history, we recommended her sister/maternal relatives have genetic counseling and testing. Diane Cox will let us know if we can be of any assistance in coordinating genetic counseling and/or testing for this family member.   Diane Cox questions were answered to her satisfaction today. Our contact information was provided should additional questions or concerns arise. Thank you for the referral and allowing [place] to share  in the care of your patient.   Faith Rogue, MS, [facility] Genetic Counselor [email or web address] Phone: [phone]  The patient was seen for a total of 35 minutes in face-to-face genetic counseling.  Drs. Magrinat, Lindi Adie and/or Burr Medico were available for discussion regarding this case.   _______________________________________________________________________ For Office Staff:  Number of people involved in session: 1 Was an Intern/ student involved with case: no

## 2019-12-09 ENCOUNTER — Ambulatory Visit: Payer: Self-pay | Admitting: General Surgery

## 2019-12-09 ENCOUNTER — Other Ambulatory Visit: Payer: Self-pay | Admitting: General Surgery

## 2019-12-09 DIAGNOSIS — D0511 Intraductal carcinoma in situ of right breast: Secondary | ICD-10-CM

## 2019-12-09 NOTE — H&P (Signed)
PATIENT PROFILE: Diane Cox is a 59 y.o. female who presents to the Clinic for consultation at the request of Dr. Kary Kos for evaluation of breast cancer.  PCP:  Lovie Macadamia, MD  HISTORY OF PRESENT ILLNESS: Diane Cox reports her usual mammogram without any complaints of palpable mass, skin changes, nipple retraction Or nipple discharge, breast pain.  Her screening mammogram shows concerning calcification.  This led to diagnostic mammogram and ultrasound.  Diagnostic mammogram shows a 9 mm span of microcalcification.  Core needle biopsy was done revealing that the carcinoma in situ of the right breast, intermediate grade.  The size of the DCIS on the core biopsy was 4 mm.  Family history of breast cancer: Aunt from mother side Family history of other cancers: Mother and multiple aunts with uterine cancer, on from mother side with colon cancer causing her mother side with colon cancer. Menarche: 59 years old Menopause: 59 years old due to hysterectomy Used OCP: For 5 years Used estrogen and progesterone therapy: None History of Radiation to the chest: None Number of pregnancies: 3 miscarriages  PROBLEM LIST:         Problem List  Date Reviewed: 10/13/2019         Noted   Hypertension Unknown   Lipoma of back 12/09/2018   Migraine with aura and without status migrainosus, not intractable 06/08/2018   Myofascial pain syndrome, cervical 07/03/2017   DDD (degenerative disc disease), lumbar 11/26/2016   Arthritis 10/21/2016   IBS (irritable bowel syndrome) 10/21/2016   Morbid obesity with BMI of 40.0-44.9 ([place]) (Chronic) 08/14/2016   Asthma without status asthmaticus, unspecified Unknown   GERD (gastroesophageal reflux disease) Unknown   Hyperlipidemia Unknown      GENERAL REVIEW OF SYSTEMS:   General ROS: negative for - chills, fever, weight gain or weight loss.  Positive for fatigue and night sweats Allergy and Immunology ROS: negative for - hives   Hematological and Lymphatic ROS: negative for - bleeding problems or bruising, negative for palpable nodes Endocrine ROS: negative for - heat or cold intolerance, hair changes Respiratory ROS: negative for - cough, positive for shortness of breath  Cardiovascular ROS: Positive for chest pain.  Negative for palpitations GI ROS: negative for nausea, vomiting, abdominal pain, diarrhea, positive constipation Musculoskeletal ROS: Positive for - joint swelling or muscle pain Neurological ROS: negative for - confusion, syncope Dermatological ROS: negative for pruritus and rash Psychiatric: negative for anxiety, depression, difficulty sleeping and memory loss  MEDICATIONS: Current Medications        Current Outpatient Medications  Medication Sig Dispense Refill  . acetaminophen (TYLENOL 8 HOUR ORAL) Take by mouth once daily as needed     . albuterol 90 mcg/actuation inhaler Inhale 2 inhalations into the lungs every 6 (six) hours as needed for Wheezing. 1 Inhaler 5  . atorvastatin (LIPITOR) 20 MG tablet Take 1 tablet by mouth once daily 90 tablet 3  . BACILLUS COAGULANS (DIGESTIVE ADVANTAGE ORAL) Take by mouth once daily.    . benzonatate (TESSALON) 100 MG capsule Take 1 capsule by mouth three times daily as needed for cough 30 capsule 0  . blood glucose diagnostic, drum test strip Use 3 (three) times daily 100 each 12  . blood glucose meter kit Use as directed 1 each 0  . carisoprodol (SOMA) 350 MG tablet TAKE ONE TABLET BY MOUTH 4 TIMES DAILY AS NEEDED 30 tablet 5  . cetirizine HCl (ZYRTEC ORAL) Take by mouth once daily    . cholecalciferol (  VITAMIN D3) 5,000 unit capsule Take 5,000 Units by mouth once daily.    Marland Kitchen dicyclomine (BENTYL) 10 mg capsule Take 1 capsule (10 mg total) by mouth 4 (four) times daily as needed 120 capsule 11  . escitalopram oxalate (LEXAPRO) 10 MG tablet Take 1 tablet by mouth once daily 90 tablet 3  . fluticasone furoate-vilanterol (BREO ELLIPTA) 100-25 mcg/dose  DsDv inhaler Inhale 1 inhalation into the lungs once daily 1 Inhaler 4  . FREESTYLE LIBRE 14 DAY reader Use 1 Device as directed 1 each 1  . FREESTYLE LIBRE 14 DAY SENSOR kit Use 1 kit every 14 (fourteen) days for glucose monitoring 1 kit 23  . gabapentin (NEURONTIN) 100 MG capsule TAKE 1 CAPSULE BY MOUTH IN THE MORNING, THEM TAKE 2 CAPSULES AT 4 PM, THEN TAKE 2 CAPSULES AT BEDTIME. 150 capsule 0  . glipiZIDE (GLUCOTROL XL) 5 MG XL tablet Take 1 tablet (5 mg total) by mouth once daily 90 tablet 3  . hydroCHLOROthiazide (HYDRODIURIL) 12.5 MG tablet TAKE 1 TABLET BY MOUTH ONCE DAILY NEED  FOLLOW  UP  APPOINTMENT  BEFORE  ADDITIONAL  REFILLS 90 tablet 3  . ipratropium (ATROVENT) 0.06 % nasal spray Place 2 sprays into both nostrils 3 (three) times daily 15 mL 11  . lancets Use 1 each 3 (three) times daily 100 each 12  . MAGNESIUM ORAL Take by mouth once daily    . methylcellulose (CITRUCEL) 500 mg tablet Take 500 mg by mouth once daily as needed for Constipation    . montelukast (SINGULAIR) 10 mg tablet Take 1 tablet (10 mg total) by mouth nightly 30 tablet 11  . pantoprazole (PROTONIX) 40 MG DR tablet Take 1 tablet (40 mg total) by mouth once daily TAKE 30 MINUTES BEFORE BREAKFAST 30 tablet 11  . sucralfate (CARAFATE) 1 gram tablet TAKE 1 TABLET BY MOUTH TWICE DAILY BEFORE MEAL(S) 60 tablet 11  . topiramate (TOPAMAX) 25 MG tablet Take 1 tablet by mouth twice daily 180 tablet 3   No current facility-administered medications for this visit.       ALLERGIES: Compazine [prochlorperazine edisylate], Sulfa (sulfonamide antibiotics), Chicken derived, Poractant alfa, and Pork derived (porcine)  PAST MEDICAL HISTORY:     Past Medical History:  Diagnosis Date  . AC (acromioclavicular) joint bone spurs 10/28/2017   on both heels  . Allergic state   . Anemia   . Arthritis   . Asthma without status asthmaticus, unspecified   . Gastritis 11/25/2016  . GERD (gastroesophageal reflux  disease)   . History of cancer    breast cancer  . Hyperlipidemia   . Hypertension   . IBS (irritable bowel syndrome)   . Internal hemorrhoids 11/25/2016  . Migraines   . Tubular adenoma of colon, unspecified 11/25/2016    PAST SURGICAL HISTORY:      Past Surgical History:  Procedure Laterality Date  . BREAST EXCISIONAL BIOPSY    . CHOLECYSTECTOMY    . COLONOSCOPY  11/25/2016   Tubular adenoma of colon/Internal hemorrhoids/Repeat 85yr/MUS  . EGD  11/25/2016   Erosive Gastritis/No Repeat/MUS  . HYSTERECTOMY  12/2012   partial  . TONSILLECTOMY       FAMILY HISTORY:      Family History  Problem Relation Age of Onset  . High blood pressure (Hypertension) Mother   . Diabetes type II Mother   . Uterine cancer Mother   . Colon polyps Mother   . Coronary Artery Disease (Blocked arteries around heart) Father 535 .  Diabetes type II Father 80  . Diabetes type II Sister   . Diabetes type II Brother   . Post-traumatic stress disorder Brother   . Bone cancer Brother   . Coronary Artery Disease (Blocked arteries around heart) Paternal Grandmother   . Stroke Paternal Grandmother   . Colon cancer Maternal Aunt   . Breast cancer Maternal Aunt   . Colon cancer Cousin   . Crohn's disease Cousin      SOCIAL HISTORY: Social History          Socioeconomic History  . Marital status: Married    Spouse name: Not on file  . Number of children: Not on file  . Years of education: Not on file  . Highest education level: Not on file  Occupational History  . Occupation: Weekend Tour manager at [employer]  . Financial resource strain: Not on file  . Food insecurity    Worry: Not on file    Inability: Not on file  . Transportation needs    Medical: Not on file    Non-medical: Not on file  Tobacco Use  . Smoking status: Never Smoker  . Smokeless tobacco: Never Used  Substance and Sexual Activity  . Alcohol use: Yes     Alcohol/week: 1.0 standard drinks    Types: 1 Shots of liquor per week    Comment: occassionally  . Drug use: No  . Sexual activity: Yes    Partners: Male  Other Topics Concern  . Not on file  Social History Narrative  . Not on file      PHYSICAL EXAM:    Vitals:   11/23/19 1420  BP: 137/82  Pulse: 87   Body mass index is 42.32 kg/m. Weight: (!) 101.6 kg (224 lb)   GENERAL: Alert, active, oriented x3  HEENT: Pupils equal reactive to light. Extraocular movements are intact. Sclera clear. Palpebral conjunctiva normal red color.Pharynx clear.  NECK: Supple with no palpable mass and no adenopathy.  LUNGS: Sound clear with no rales rhonchi or wheezes.  HEART: Regular rhythm S1 and S2 without murmur.  BREAST: breasts appear normal, no suspicious masses, no skin or nipple changes or axillary nodes.  ABDOMEN: Soft and depressible, nontender with no palpable mass, no hepatomegaly.  EXTREMITIES: Well-developed well-nourished symmetrical with no dependent edema.  NEUROLOGICAL: Awake alert oriented, facial expression symmetrical, moving all extremities.  REVIEW OF DATA: I have reviewed the following data today:      Appointment on 10/04/2019  Component Date Value  . Hemoglobin A1C 10/04/2019 9.3*  . Average Blood Glucose (C* 10/04/2019 220   . Glucose 10/04/2019 188*  . Sodium 10/04/2019 141   . Potassium 10/04/2019 3.5*  . Chloride 10/04/2019 101   . Carbon Dioxide (CO2) 10/04/2019 34.9*  . Urea Nitrogen (BUN) 10/04/2019 10   . Creatinine 10/04/2019 0.6   . Glomerular Filtration Ra* 10/04/2019 103   . Calcium 10/04/2019 9.6   . AST  10/04/2019 25   . ALT  10/04/2019 28   . Alk Phos (alkaline Phosp* 10/04/2019 118*  . Albumin 10/04/2019 4.3   . Bilirubin, Total 10/04/2019 0.5   . Protein, Total 10/04/2019 7.1   . A/G Ratio 10/04/2019 1.5      ASSESSMENT: Ms. Ayllon is a 59 y.o. female presenting for consultation for right breast DCIS.     Patient was oriented again about the pathology results. Surgical alternatives were discussed with patient including partial vs total mastectomy. Surgical technique and post  operative care was discussed with patient. Risk of surgery was discussed with patient including but not limited to: wound infection, seroma, hematoma, brachial plexopathy, mondor's disease (thrombosis of small veins of breast), chronic wound pain, breast lymphedema, altered sensation to the nipple and cosmesis among others.   Patient with an extensive history of malignancy in her family.  Patient will see oncologist tomorrow.  Discussion about further genetic testing will be taken with her oncologist.  Patient asked about alternative treatments.  During my appointment we will discuss about the surgical alternatives which includes partial mastectomy, needle guided.  I also discussed with her that the standard treatment includes radiation therapy and antiestrogen therapy.  Patient asked about the comet trial which is basically treatment with radiation therapy without surgery.  I recommended to discuss this further with the oncologist.  She mentioned that with her family history of multiple cancers she will not take the risk of none doing the standard therapy.  I consider that her best option is to discuss all the alternative with oncologist and we can further discuss about the surgical management.  If patient decides to proceed with surgery partial mastectomy was already discussed with the patient including the benefit and the risk.  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 We discussed about surgical treatment of DCIS which includes needle guided partial mastectomy, radiation therapy and antiestrogen therapy. 2. You will have an appointment tomorrow with the oncologist for further discussion of these therapies. 3. After evaluation by oncologist, genetic testing was ordered.  4. If negative will proceed  with partial mastectomy (19301). Patient chose not to have sentinel lymph node on this surgery. If DCIS upgraded to invasive mammary carcinoma, will need sentinel lymph node biopsy.   Patient and her husband verbalized understanding, all questions were answered, and were agreeable with the plan outlined above.     Herbert Pun, MD  Electronically signed by Herbert Pun, MD

## 2019-12-09 NOTE — H&P (View-Only) (Signed)
PATIENT PROFILE: Diane Cox is a 59 y.o. female who presents to the Clinic for consultation at the request of Dr. Kary Kos for evaluation of breast cancer.  PCP:  Lovie Macadamia, MD  HISTORY OF PRESENT ILLNESS: [name] reports her usual mammogram without any complaints of palpable mass, skin changes, nipple retraction Or nipple discharge, breast pain.  Her screening mammogram shows concerning calcification.  This led to diagnostic mammogram and ultrasound.  Diagnostic mammogram shows a 9 mm span of microcalcification.  Core needle biopsy was done revealing that the carcinoma in situ of the right breast, intermediate grade.  The size of the DCIS on the core biopsy was 4 mm.  Family history of breast cancer: Aunt from mother side Family history of other cancers: Mother and multiple aunts with uterine cancer, on from mother side with colon cancer causing her mother side with colon cancer. Menarche: 59 years old Menopause: 59 years old due to hysterectomy Used OCP: For 5 years Used estrogen and progesterone therapy: None History of Radiation to the chest: None Number of pregnancies: 3 miscarriages  PROBLEM LIST:         Problem List  Date Reviewed: 10/13/2019         Noted   Hypertension Unknown   Lipoma of back 12/09/2018   Migraine with aura and without status migrainosus, not intractable 06/08/2018   Myofascial pain syndrome, cervical 07/03/2017   DDD (degenerative disc disease), lumbar 11/26/2016   Arthritis 10/21/2016   IBS (irritable bowel syndrome) 10/21/2016   Morbid obesity with BMI of 40.0-44.9 ([place]) (Chronic) 08/14/2016   Asthma without status asthmaticus, unspecified Unknown   GERD (gastroesophageal reflux disease) Unknown   Hyperlipidemia Unknown      GENERAL REVIEW OF SYSTEMS:   General ROS: negative for - chills, fever, weight gain or weight loss.  Positive for fatigue and night sweats Allergy and Immunology ROS: negative for - hives   Hematological and Lymphatic ROS: negative for - bleeding problems or bruising, negative for palpable nodes Endocrine ROS: negative for - heat or cold intolerance, hair changes Respiratory ROS: negative for - cough, positive for shortness of breath  Cardiovascular ROS: Positive for chest pain.  Negative for palpitations GI ROS: negative for nausea, vomiting, abdominal pain, diarrhea, positive constipation Musculoskeletal ROS: Positive for - joint swelling or muscle pain Neurological ROS: negative for - confusion, syncope Dermatological ROS: negative for pruritus and rash Psychiatric: negative for anxiety, depression, difficulty sleeping and memory loss  MEDICATIONS: Current Medications        Current Outpatient Medications  Medication Sig Dispense Refill  . acetaminophen (TYLENOL 8 HOUR ORAL) Take by mouth once daily as needed     . albuterol 90 mcg/actuation inhaler Inhale 2 inhalations into the lungs every 6 (six) hours as needed for Wheezing. 1 Inhaler 5  . atorvastatin (LIPITOR) 20 MG tablet Take 1 tablet by mouth once daily 90 tablet 3  . BACILLUS COAGULANS (DIGESTIVE ADVANTAGE ORAL) Take by mouth once daily.    . benzonatate (TESSALON) 100 MG capsule Take 1 capsule by mouth three times daily as needed for cough 30 capsule 0  . blood glucose diagnostic, drum test strip Use 3 (three) times daily 100 each 12  . blood glucose meter kit Use as directed 1 each 0  . carisoprodol (SOMA) 350 MG tablet TAKE ONE TABLET BY MOUTH 4 TIMES DAILY AS NEEDED 30 tablet 5  . cetirizine HCl (ZYRTEC ORAL) Take by mouth once daily    . cholecalciferol (  VITAMIN D3) 5,000 unit capsule Take 5,000 Units by mouth once daily.    Marland Kitchen dicyclomine (BENTYL) 10 mg capsule Take 1 capsule (10 mg total) by mouth 4 (four) times daily as needed 120 capsule 11  . escitalopram oxalate (LEXAPRO) 10 MG tablet Take 1 tablet by mouth once daily 90 tablet 3  . fluticasone furoate-vilanterol (BREO ELLIPTA) 100-25 mcg/dose  DsDv inhaler Inhale 1 inhalation into the lungs once daily 1 Inhaler 4  . FREESTYLE LIBRE 14 DAY reader Use 1 Device as directed 1 each 1  . FREESTYLE LIBRE 14 DAY SENSOR kit Use 1 kit every 14 (fourteen) days for glucose monitoring 1 kit 23  . gabapentin (NEURONTIN) 100 MG capsule TAKE 1 CAPSULE BY MOUTH IN THE MORNING, THEM TAKE 2 CAPSULES AT 4 PM, THEN TAKE 2 CAPSULES AT BEDTIME. 150 capsule 0  . glipiZIDE (GLUCOTROL XL) 5 MG XL tablet Take 1 tablet (5 mg total) by mouth once daily 90 tablet 3  . hydroCHLOROthiazide (HYDRODIURIL) 12.5 MG tablet TAKE 1 TABLET BY MOUTH ONCE DAILY NEED  FOLLOW  UP  APPOINTMENT  BEFORE  ADDITIONAL  REFILLS 90 tablet 3  . ipratropium (ATROVENT) 0.06 % nasal spray Place 2 sprays into both nostrils 3 (three) times daily 15 mL 11  . lancets Use 1 each 3 (three) times daily 100 each 12  . MAGNESIUM ORAL Take by mouth once daily    . methylcellulose (CITRUCEL) 500 mg tablet Take 500 mg by mouth once daily as needed for Constipation    . montelukast (SINGULAIR) 10 mg tablet Take 1 tablet (10 mg total) by mouth nightly 30 tablet 11  . pantoprazole (PROTONIX) 40 MG DR tablet Take 1 tablet (40 mg total) by mouth once daily TAKE 30 MINUTES BEFORE BREAKFAST 30 tablet 11  . sucralfate (CARAFATE) 1 gram tablet TAKE 1 TABLET BY MOUTH TWICE DAILY BEFORE MEAL(S) 60 tablet 11  . topiramate (TOPAMAX) 25 MG tablet Take 1 tablet by mouth twice daily 180 tablet 3   No current facility-administered medications for this visit.       ALLERGIES: Compazine [prochlorperazine edisylate], Sulfa (sulfonamide antibiotics), Chicken derived, Poractant alfa, and Pork derived (porcine)  PAST MEDICAL HISTORY:     Past Medical History:  Diagnosis Date  . AC (acromioclavicular) joint bone spurs 10/28/2017   on both heels  . Allergic state   . Anemia   . Arthritis   . Asthma without status asthmaticus, unspecified   . Gastritis 11/25/2016  . GERD (gastroesophageal reflux  disease)   . History of cancer    breast cancer  . Hyperlipidemia   . Hypertension   . IBS (irritable bowel syndrome)   . Internal hemorrhoids 11/25/2016  . Migraines   . Tubular adenoma of colon, unspecified 11/25/2016    PAST SURGICAL HISTORY:      Past Surgical History:  Procedure Laterality Date  . BREAST EXCISIONAL BIOPSY    . CHOLECYSTECTOMY    . COLONOSCOPY  11/25/2016   Tubular adenoma of colon/Internal hemorrhoids/Repeat 48yr/MUS  . EGD  11/25/2016   Erosive Gastritis/No Repeat/MUS  . HYSTERECTOMY  12/2012   partial  . TONSILLECTOMY       FAMILY HISTORY:      Family History  Problem Relation Age of Onset  . High blood pressure (Hypertension) Mother   . Diabetes type II Mother   . Uterine cancer Mother   . Colon polyps Mother   . Coronary Artery Disease (Blocked arteries around heart) Father 520 .  Diabetes type II Father 80  . Diabetes type II Sister   . Diabetes type II Brother   . Post-traumatic stress disorder Brother   . Bone cancer Brother   . Coronary Artery Disease (Blocked arteries around heart) Paternal Grandmother   . Stroke Paternal Grandmother   . Colon cancer Maternal Aunt   . Breast cancer Maternal Aunt   . Colon cancer Cousin   . Crohn's disease Cousin      SOCIAL HISTORY: Social History          Socioeconomic History  . Marital status: Married    Spouse name: Not on file  . Number of children: Not on file  . Years of education: Not on file  . Highest education level: Not on file  Occupational History  . Occupation: Weekend Tour manager at [employer]  . Financial resource strain: Not on file  . Food insecurity    Worry: Not on file    Inability: Not on file  . Transportation needs    Medical: Not on file    Non-medical: Not on file  Tobacco Use  . Smoking status: Never Smoker  . Smokeless tobacco: Never Used  Substance and Sexual Activity  . Alcohol use: Yes     Alcohol/week: 1.0 standard drinks    Types: 1 Shots of liquor per week    Comment: occassionally  . Drug use: No  . Sexual activity: Yes    Partners: Male  Other Topics Concern  . Not on file  Social History Narrative  . Not on file      PHYSICAL EXAM:    Vitals:   11/23/19 1420  BP: 137/82  Pulse: 87   Body mass index is 42.32 kg/m. Weight: (!) 101.6 kg (224 lb)   GENERAL: Alert, active, oriented x3  HEENT: Pupils equal reactive to light. Extraocular movements are intact. Sclera clear. Palpebral conjunctiva normal red color.Pharynx clear.  NECK: Supple with no palpable mass and no adenopathy.  LUNGS: Sound clear with no rales rhonchi or wheezes.  HEART: Regular rhythm S1 and S2 without murmur.  BREAST: breasts appear normal, no suspicious masses, no skin or nipple changes or axillary nodes.  ABDOMEN: Soft and depressible, nontender with no palpable mass, no hepatomegaly.  EXTREMITIES: Well-developed well-nourished symmetrical with no dependent edema.  NEUROLOGICAL: Awake alert oriented, facial expression symmetrical, moving all extremities.  REVIEW OF DATA: I have reviewed the following data today:      Appointment on 10/04/2019  Component Date Value  . Hemoglobin A1C 10/04/2019 9.3*  . Average Blood Glucose (C* 10/04/2019 220   . Glucose 10/04/2019 188*  . Sodium 10/04/2019 141   . Potassium 10/04/2019 3.5*  . Chloride 10/04/2019 101   . Carbon Dioxide (CO2) 10/04/2019 34.9*  . Urea Nitrogen (BUN) 10/04/2019 10   . Creatinine 10/04/2019 0.6   . Glomerular Filtration Ra* 10/04/2019 103   . Calcium 10/04/2019 9.6   . AST  10/04/2019 25   . ALT  10/04/2019 28   . Alk Phos (alkaline Phosp* 10/04/2019 118*  . Albumin 10/04/2019 4.3   . Bilirubin, Total 10/04/2019 0.5   . Protein, Total 10/04/2019 7.1   . A/G Ratio 10/04/2019 1.5      ASSESSMENT: Ms. Ayllon is a 59 y.o. female presenting for consultation for right breast DCIS.     Patient was oriented again about the pathology results. Surgical alternatives were discussed with patient including partial vs total mastectomy. Surgical technique and post  operative care was discussed with patient. Risk of surgery was discussed with patient including but not limited to: wound infection, seroma, hematoma, brachial plexopathy, mondor's disease (thrombosis of small veins of breast), chronic wound pain, breast lymphedema, altered sensation to the nipple and cosmesis among others.   Patient with an extensive history of malignancy in her family.  Patient will see oncologist tomorrow.  Discussion about further genetic testing will be taken with her oncologist.  Patient asked about alternative treatments.  During my appointment we will discuss about the surgical alternatives which includes partial mastectomy, needle guided.  I also discussed with her that the standard treatment includes radiation therapy and antiestrogen therapy.  Patient asked about the comet trial which is basically treatment with radiation therapy without surgery.  I recommended to discuss this further with the oncologist.  She mentioned that with her family history of multiple cancers she will not take the risk of none doing the standard therapy.  I consider that her best option is to discuss all the alternative with oncologist and we can further discuss about the surgical management.  If patient decides to proceed with surgery partial mastectomy was already discussed with the patient including the benefit and the risk.  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 We discussed about surgical treatment of DCIS which includes needle guided partial mastectomy, radiation therapy and antiestrogen therapy. 2. You will have an appointment tomorrow with the oncologist for further discussion of these therapies. 3. After evaluation by oncologist, genetic testing was ordered.  4. If negative will proceed  with partial mastectomy (19301). Patient chose not to have sentinel lymph node on this surgery. If DCIS upgraded to invasive mammary carcinoma, will need sentinel lymph node biopsy.   Patient and her husband verbalized understanding, all questions were answered, and were agreeable with the plan outlined above.     Herbert Pun, MD  Electronically signed by Herbert Pun, MD

## 2019-12-13 ENCOUNTER — Other Ambulatory Visit: Payer: Self-pay | Admitting: Gastroenterology

## 2019-12-13 DIAGNOSIS — R1031 Right lower quadrant pain: Secondary | ICD-10-CM

## 2019-12-13 DIAGNOSIS — R1010 Upper abdominal pain, unspecified: Secondary | ICD-10-CM

## 2019-12-13 DIAGNOSIS — R110 Nausea: Secondary | ICD-10-CM

## 2019-12-14 ENCOUNTER — Other Ambulatory Visit: Payer: Self-pay

## 2019-12-14 ENCOUNTER — Encounter
Admission: RE | Admit: 2019-12-14 | Discharge: 2019-12-14 | Disposition: A | Discharge status: Home / Self Care | Payer: BC Managed Care – PPO | Source: Ambulatory Visit | Attending: General Surgery | Admitting: General Surgery

## 2019-12-14 ENCOUNTER — Ambulatory Visit
Admission: RE | Admit: 2019-12-14 | Discharge: 2019-12-14 | Disposition: A | Discharge status: Home / Self Care | Payer: BC Managed Care – PPO | Source: Ambulatory Visit | Attending: Gastroenterology | Admitting: Gastroenterology

## 2019-12-14 DIAGNOSIS — R110 Nausea: Secondary | ICD-10-CM

## 2019-12-14 DIAGNOSIS — R1010 Upper abdominal pain, unspecified: Secondary | ICD-10-CM

## 2019-12-14 DIAGNOSIS — R1031 Right lower quadrant pain: Secondary | ICD-10-CM | POA: Diagnosis present

## 2019-12-14 HISTORY — DX: Personal history of urinary calculi: Z87.442

## 2019-12-14 HISTORY — DX: Sleep apnea, unspecified: G47.30

## 2019-12-14 HISTORY — DX: Other specified postprocedural states: R11.2

## 2019-12-14 HISTORY — DX: Nausea with vomiting, unspecified: Z98.890

## 2019-12-14 HISTORY — DX: Malignant (primary) neoplasm, unspecified: C80.1

## 2019-12-14 HISTORY — DX: Fatty (change of) liver, not elsewhere classified: K76.0

## 2019-12-14 HISTORY — DX: Other cervical disc degeneration, unspecified cervical region: M50.30

## 2019-12-14 HISTORY — DX: Other complications of anesthesia, initial encounter: T88.59XA

## 2019-12-14 MED ORDER — IOHEXOL 300 MG/ML  SOLN
100.0000 mL | Freq: Once | INTRAMUSCULAR | Status: AC | PRN
  Administered 2019-12-14: 100 mL via INTRAVENOUS

## 2019-12-14 NOTE — Pre-Procedure Instructions (Signed)
EKG: Interpreted by me.  Sinus rhythm with a rate of 89 bpm, low voltage, nonspecific ST segment changes, normal QT  ____________________________________________  ED COURSE:  As part of my medical decision making, I reviewed the following data within the [facility] obtained from family if available, nursing notes,old chart and ekg, as well as notes from prior ED visits. Patient presented for chest pain and shortness of breath, we will assess with labs and imaging as indicated at this time. Procedures  Diane Cox was evaluated in Emergency Department on 06/17/2019 for the symptoms described in the history of present illness. She was evaluated in the context of the global COVID-19 pandemic, which necessitated consideration that the patient might be at risk for infection with the SARS-CoV-2 virus that causes COVID-19. Institutional protocols and algorithms that pertain to the evaluation of patients at risk for COVID-19 are in a state of rapid change based on information released by regulatory bodies including the CDC and federal and state organizations. These policies and algorithms were followed during the patient's care in the ED.  ____________________________________________   LABS (pertinent positives/negatives)       Labs Reviewed  CBC - Abnormal; Notable for the following components:      Result Value    WBC 13.3 (*)    All other components within normal limits  COMPREHENSIVE METABOLIC PANEL - Abnormal; Notable for the following components:   Potassium 3.3 (*)    Chloride 97 (*)    Glucose, Bld 256 (*)    Alkaline Phosphatase 136 (*)    Total Bilirubin 0.2 (*)    All other components within normal limits  SARS CORONAVIRUS 2 (HOSPITAL ORDER, [facility])  FIBRIN DERIVATIVES D-DIMER ([facility] ONLY)  TROPONIN I (HIGH SENSITIVITY)    RADIOLOGY Images were viewed by me  Chest x-ray IMPRESSION: No active  disease. ____________________________________________  DIFFERENTIAL DIAGNOSIS   URI, pneumonia, COVID-19, PE, MI  FINAL ASSESSMENT AND PLAN  Chest pain, shortness of breath   Plan: The patient had presented for chest pain and shortness of breath. Patient's labs did reveal mild leukocytosis without other specific etiology.  She reports her blood sugar sometimes runs in the 200s as it is today. Patient's imaging was negative.  She will be discharged with steroids, I will try a Z-Pak and cough medication due to her persistent symptoms.   Laurence Aly, MD   Note: This note was generated in part or whole with voice recognition software. Voice recognition is usually quite accurate but there are transcription errors that can and very often do occur. I apologize for any typographical errors that were not detected and corrected.     Earleen Newport, MD 06/17/19 1150         Electronically signed by Earleen Newport, MD at 06/17/2019 11:50 AM   ED on 06/17/2019     Detailed Report    Note shared with patient

## 2019-12-14 NOTE — Patient Instructions (Signed)
Your procedure is scheduled on: 12-19-19 MONDAY Report to [place] @ 7:45 PER PT  Remember: Instructions that are not followed completely may result in serious medical risk, up to and including death, or upon the discretion of your surgeon and anesthesiologist your surgery may need to be rescheduled.    _x___ 1. Do not eat food after midnight the night before your procedure. NO GUM OR CANDY AFTER MIDNIGHT. You may drink WATER up to 2 hours before you are scheduled to arrive at the hospital for your procedure.  Do not drink WATER within 2 hours of your scheduled arrival to the hospital.  Type 1 and type 2 diabetics should only drink water.   ____Ensure clear carbohydrate drink on the way to the hospital for bariatric patients  ____Ensure clear carbohydrate drink 3 hours before surgery.    __x__ 2. No Alcohol for 24 hours before or after surgery.   __x__3. No Smoking or e-cigarettes for 24 prior to surgery.  Do not use any chewable tobacco products for at least 6 hour prior to surgery   ____  4. Bring all medications with you on the day of surgery if instructed.    __x__ 5. Notify your doctor if there is any change in your medical condition     (cold, fever, infections).    x___6. On the morning of surgery brush your teeth with toothpaste and water.  You may rinse your mouth with mouth wash if you wish.  Do not swallow any toothpaste or mouthwash.   Do not wear jewelry, make-up, hairpins, clips or nail polish.  Do not wear lotions, powders, or perfumes. You may wear deodorant.  Do not shave 48 hours prior to surgery. Men may shave face and neck.  Do not bring valuables to the hospital.    [facility] is not responsible for any belongings or valuables.               Contacts, dentures or bridgework may not be worn into surgery.  Leave your suitcase in the car. After surgery it may be brought to your room.  For patients admitted to the hospital, discharge time is  determined by your treatment team.  _  Patients discharged the day of surgery will not be allowed to drive home.  You will need someone to drive you home and stay with you the night of your procedure.    Please read over the following fact sheets that you were given:   [facility] Preparing for Surgery    _x___ TAKE THE FOLLOWING MEDICATION THE MORNING OF SURGERY WITH A SMALL SIP OF WATER. These include:  1. LEXAPRO (ESCITALOPRAM)  2. SINGULAIR (MONTELUKAST)  3. TOPAMAX (TOPIRAMATE)  4. PROTONIX (PANTOPRAZOLE)  5. TAKE AN EXTRA PROTONIX THE NIGHT BEFORE YOUR SURGERY  6.  ____Fleets enema or Magnesium Citrate as directed.   _x___ Use CHG Soap or sage wipes as directed on instruction sheet   _X___ Use inhalers on the day of surgery and bring to hospital day of surgery-USE YOUR ALBUTEROL INHALER AM OF SURGERY AND Section  ____ Stop Metformin and Janumet 2 days prior to surgery.    ____ Take 1/2 of usual insulin dose the night before surgery and none on the morning surgery.   ____ Follow recommendations from Cardiologist, Pulmonologist or PCP regarding stopping Aspirin, Coumadin, Plavix ,Eliquis, Effient, or Pradaxa, and Pletal.  X____Stop Anti-inflammatories such as Advil, Aleve, Ibuprofen, Motrin, Naproxen, Naprosyn, Goodies powders or aspirin products  NOW-OK to take Tylenol    ____ Stop supplements until after surgery.     _X___ Bring C-Pap to the hospital.

## 2019-12-15 ENCOUNTER — Other Ambulatory Visit
Admission: RE | Admit: 2019-12-15 | Discharge: 2019-12-15 | Disposition: A | Discharge status: Home / Self Care | Payer: BC Managed Care – PPO | Source: Ambulatory Visit | Attending: General Surgery | Admitting: General Surgery

## 2019-12-15 DIAGNOSIS — Z01812 Encounter for preprocedural laboratory examination: Secondary | ICD-10-CM | POA: Insufficient documentation

## 2019-12-15 DIAGNOSIS — Z20822 Contact with and (suspected) exposure to covid-19: Secondary | ICD-10-CM | POA: Diagnosis not present

## 2019-12-15 LAB — SARS CORONAVIRUS 2 (TAT 6-24 HRS): SARS Coronavirus 2: NEGATIVE

## 2019-12-16 NOTE — Progress Notes (Signed)
Working with team related to genetic testing results.  Results delayed per Faith Rogue.  Now due 2/17.

## 2019-12-19 ENCOUNTER — Encounter
Admission: RE | Disposition: A | Discharge status: Home / Self Care | Payer: Self-pay | Source: Home / Self Care | Attending: General Surgery

## 2019-12-19 ENCOUNTER — Ambulatory Visit
Admission: RE | Admit: 2019-12-19 | Discharge: 2019-12-19 | Disposition: A | Discharge status: Home / Self Care | Payer: BC Managed Care – PPO | Attending: General Surgery | Admitting: General Surgery

## 2019-12-19 ENCOUNTER — Ambulatory Visit: Payer: BC Managed Care – PPO | Admitting: Certified Registered"

## 2019-12-19 ENCOUNTER — Ambulatory Visit
Admission: RE | Admit: 2019-12-19 | Discharge: 2019-12-19 | Disposition: A | Discharge status: Home / Self Care | Payer: BC Managed Care – PPO | Source: Ambulatory Visit | Attending: General Surgery | Admitting: General Surgery

## 2019-12-19 ENCOUNTER — Other Ambulatory Visit: Payer: Self-pay

## 2019-12-19 ENCOUNTER — Encounter: Payer: Self-pay | Admitting: General Surgery

## 2019-12-19 DIAGNOSIS — Z6841 Body Mass Index (BMI) 40.0 and over, adult: Secondary | ICD-10-CM | POA: Diagnosis not present

## 2019-12-19 DIAGNOSIS — Z79899 Other long term (current) drug therapy: Secondary | ICD-10-CM | POA: Insufficient documentation

## 2019-12-19 DIAGNOSIS — Z8049 Family history of malignant neoplasm of other genital organs: Secondary | ICD-10-CM | POA: Insufficient documentation

## 2019-12-19 DIAGNOSIS — K589 Irritable bowel syndrome without diarrhea: Secondary | ICD-10-CM | POA: Diagnosis not present

## 2019-12-19 DIAGNOSIS — J45909 Unspecified asthma, uncomplicated: Secondary | ICD-10-CM | POA: Insufficient documentation

## 2019-12-19 DIAGNOSIS — D0511 Intraductal carcinoma in situ of right breast: Secondary | ICD-10-CM

## 2019-12-19 DIAGNOSIS — G43909 Migraine, unspecified, not intractable, without status migrainosus: Secondary | ICD-10-CM | POA: Insufficient documentation

## 2019-12-19 DIAGNOSIS — E119 Type 2 diabetes mellitus without complications: Secondary | ICD-10-CM | POA: Insufficient documentation

## 2019-12-19 DIAGNOSIS — I10 Essential (primary) hypertension: Secondary | ICD-10-CM | POA: Insufficient documentation

## 2019-12-19 DIAGNOSIS — K219 Gastro-esophageal reflux disease without esophagitis: Secondary | ICD-10-CM | POA: Diagnosis not present

## 2019-12-19 DIAGNOSIS — G4730 Sleep apnea, unspecified: Secondary | ICD-10-CM | POA: Insufficient documentation

## 2019-12-19 DIAGNOSIS — Z803 Family history of malignant neoplasm of breast: Secondary | ICD-10-CM | POA: Diagnosis not present

## 2019-12-19 DIAGNOSIS — E785 Hyperlipidemia, unspecified: Secondary | ICD-10-CM | POA: Diagnosis not present

## 2019-12-19 DIAGNOSIS — Z7984 Long term (current) use of oral hypoglycemic drugs: Secondary | ICD-10-CM | POA: Diagnosis not present

## 2019-12-19 DIAGNOSIS — Z7951 Long term (current) use of inhaled steroids: Secondary | ICD-10-CM | POA: Diagnosis not present

## 2019-12-19 DIAGNOSIS — Z800 Family history of malignant neoplasm of digestive organs: Secondary | ICD-10-CM | POA: Insufficient documentation

## 2019-12-19 HISTORY — PX: BREAST LUMPECTOMY: SHX2

## 2019-12-19 HISTORY — PX: PARTIAL MASTECTOMY WITH NEEDLE LOCALIZATION: SHX6008

## 2019-12-19 LAB — GLUCOSE, CAPILLARY
Glucose-Capillary: 202 mg/dL — ABNORMAL HIGH (ref 70–99)
Glucose-Capillary: 174 mg/dL — ABNORMAL HIGH (ref 70–99)

## 2019-12-19 SURGERY — PARTIAL MASTECTOMY WITH NEEDLE LOCALIZATION
Anesthesia: General | Site: Breast | Laterality: Right

## 2019-12-19 MED ORDER — ESMOLOL HCL 100 MG/10ML IV SOLN
INTRAVENOUS | Status: AC
  Filled 2019-12-19: qty 10

## 2019-12-19 MED ORDER — HYDROMORPHONE HCL 1 MG/ML IJ SOLN
INTRAMUSCULAR | Status: DC | PRN
  Administered 2019-12-19 (×2): .5 mg via INTRAVENOUS

## 2019-12-19 MED ORDER — SUCCINYLCHOLINE CHLORIDE 20 MG/ML IJ SOLN
INTRAMUSCULAR | Status: DC | PRN
  Administered 2019-12-19: 100 mg via INTRAVENOUS

## 2019-12-19 MED ORDER — ROCURONIUM BROMIDE 100 MG/10ML IV SOLN
INTRAVENOUS | Status: DC | PRN
  Administered 2019-12-19: 30 mg via INTRAVENOUS
  Administered 2019-12-19 (×2): 10 mg via INTRAVENOUS

## 2019-12-19 MED ORDER — FENTANYL CITRATE (PF) 100 MCG/2ML IJ SOLN
INTRAMUSCULAR | Status: AC
  Administered 2019-12-19: 25 ug via INTRAVENOUS
  Filled 2019-12-19: qty 2

## 2019-12-19 MED ORDER — SODIUM CHLORIDE 0.9 % IV SOLN: INTRAVENOUS | Status: DC

## 2019-12-19 MED ORDER — MIDAZOLAM HCL 2 MG/2ML IJ SOLN
INTRAMUSCULAR | Status: AC
  Filled 2019-12-19: qty 2

## 2019-12-19 MED ORDER — OXYCODONE HCL 5 MG/5ML PO SOLN: 5.0000 mg | Freq: Once | ORAL | Status: DC | PRN

## 2019-12-19 MED ORDER — BUPIVACAINE-EPINEPHRINE 0.5% -1:200000 IJ SOLN
INTRAMUSCULAR | Status: DC | PRN
  Administered 2019-12-19: 30 mL

## 2019-12-19 MED ORDER — FENTANYL CITRATE (PF) 100 MCG/2ML IJ SOLN
INTRAMUSCULAR | Status: AC
  Filled 2019-12-19: qty 2

## 2019-12-19 MED ORDER — SUGAMMADEX SODIUM 500 MG/5ML IV SOLN
INTRAVENOUS | Status: AC
  Filled 2019-12-19: qty 5

## 2019-12-19 MED ORDER — PROPOFOL 10 MG/ML IV BOLUS
INTRAVENOUS | Status: DC | PRN
  Administered 2019-12-19: 20 mg via INTRAVENOUS
  Administered 2019-12-19: 30 mg via INTRAVENOUS
  Administered 2019-12-19: 160 mg via INTRAVENOUS

## 2019-12-19 MED ORDER — MIDAZOLAM HCL 2 MG/2ML IJ SOLN
INTRAMUSCULAR | Status: DC | PRN
  Administered 2019-12-19: 2 mg via INTRAVENOUS

## 2019-12-19 MED ORDER — PROPOFOL 500 MG/50ML IV EMUL
INTRAVENOUS | Status: AC
  Filled 2019-12-19: qty 50

## 2019-12-19 MED ORDER — LIDOCAINE HCL (PF) 2 % IJ SOLN
INTRAMUSCULAR | Status: AC
  Filled 2019-12-19: qty 5

## 2019-12-19 MED ORDER — PHENYLEPHRINE HCL (PRESSORS) 10 MG/ML IV SOLN
INTRAVENOUS | Status: DC | PRN
  Administered 2019-12-19: 100 ug via INTRAVENOUS

## 2019-12-19 MED ORDER — DEXAMETHASONE SODIUM PHOSPHATE 10 MG/ML IJ SOLN
INTRAMUSCULAR | Status: AC
  Filled 2019-12-19: qty 2

## 2019-12-19 MED ORDER — HYDROMORPHONE HCL 1 MG/ML IJ SOLN
INTRAMUSCULAR | Status: AC
  Filled 2019-12-19: qty 1

## 2019-12-19 MED ORDER — GLYCOPYRROLATE 0.2 MG/ML IJ SOLN
INTRAMUSCULAR | Status: DC | PRN
  Administered 2019-12-19: .2 mg via INTRAVENOUS

## 2019-12-19 MED ORDER — PROPOFOL 500 MG/50ML IV EMUL
INTRAVENOUS | Status: DC | PRN
  Administered 2019-12-19: 175 ug/kg/min via INTRAVENOUS
  Administered 2019-12-19: 150 mg via INTRAVENOUS

## 2019-12-19 MED ORDER — FENTANYL CITRATE (PF) 100 MCG/2ML IJ SOLN
INTRAMUSCULAR | Status: DC | PRN
  Administered 2019-12-19 (×2): 50 ug via INTRAVENOUS

## 2019-12-19 MED ORDER — ONDANSETRON HCL 4 MG/2ML IJ SOLN
INTRAMUSCULAR | Status: AC
  Filled 2019-12-19: qty 4

## 2019-12-19 MED ORDER — GLYCOPYRROLATE 0.2 MG/ML IJ SOLN
INTRAMUSCULAR | Status: AC
  Filled 2019-12-19: qty 1

## 2019-12-19 MED ORDER — ONDANSETRON HCL 4 MG/2ML IJ SOLN
INTRAMUSCULAR | Status: DC | PRN
  Administered 2019-12-19: 4 mg via INTRAVENOUS

## 2019-12-19 MED ORDER — PROMETHAZINE HCL 25 MG/ML IJ SOLN: 6.2500 mg | INTRAMUSCULAR | Status: DC | PRN

## 2019-12-19 MED ORDER — HYDROCODONE-ACETAMINOPHEN 5-325 MG PO TABS: 1.0000 | ORAL_TABLET | ORAL | 0 refills | Status: AC | PRN

## 2019-12-19 MED ORDER — ROCURONIUM BROMIDE 50 MG/5ML IV SOLN
INTRAVENOUS | Status: AC
  Filled 2019-12-19: qty 1

## 2019-12-19 MED ORDER — SCOPOLAMINE 1 MG/3DAYS TD PT72
1.0000 | MEDICATED_PATCH | TRANSDERMAL | Status: DC
  Administered 2019-12-19: 1.5 mg via TRANSDERMAL

## 2019-12-19 MED ORDER — FENTANYL CITRATE (PF) 100 MCG/2ML IJ SOLN
25.0000 ug | INTRAMUSCULAR | Status: DC | PRN
  Administered 2019-12-19: 25 ug via INTRAVENOUS

## 2019-12-19 MED ORDER — DEXAMETHASONE SODIUM PHOSPHATE 10 MG/ML IJ SOLN
INTRAMUSCULAR | Status: DC | PRN
  Administered 2019-12-19: 10 mg via INTRAVENOUS

## 2019-12-19 MED ORDER — ESMOLOL HCL 100 MG/10ML IV SOLN
INTRAVENOUS | Status: DC | PRN
  Administered 2019-12-19 (×2): 10 mg via INTRAVENOUS
  Administered 2019-12-19: 30 mg via INTRAVENOUS

## 2019-12-19 MED ORDER — LACTATED RINGERS IV SOLN: INTRAVENOUS | Status: DC | PRN

## 2019-12-19 MED ORDER — FLUMAZENIL 0.5 MG/5ML IV SOLN
INTRAVENOUS | Status: DC | PRN
  Administered 2019-12-19: .2 mg via INTRAVENOUS

## 2019-12-19 MED ORDER — CEFAZOLIN SODIUM-DEXTROSE 2-4 GM/100ML-% IV SOLN
2.0000 g | INTRAVENOUS | Status: AC
  Administered 2019-12-19: 2 g via INTRAVENOUS

## 2019-12-19 MED ORDER — ACETAMINOPHEN 10 MG/ML IV SOLN
INTRAVENOUS | Status: AC
  Filled 2019-12-19: qty 100

## 2019-12-19 MED ORDER — SUCCINYLCHOLINE CHLORIDE 20 MG/ML IJ SOLN
INTRAMUSCULAR | Status: AC
  Filled 2019-12-19: qty 1

## 2019-12-19 MED ORDER — SUGAMMADEX SODIUM 500 MG/5ML IV SOLN
INTRAVENOUS | Status: DC | PRN
  Administered 2019-12-19: 300 mg via INTRAVENOUS

## 2019-12-19 MED ORDER — LIDOCAINE HCL (CARDIAC) PF 100 MG/5ML IV SOSY
PREFILLED_SYRINGE | INTRAVENOUS | Status: DC | PRN
  Administered 2019-12-19: 100 mg via INTRAVENOUS

## 2019-12-19 MED ORDER — OXYCODONE HCL 5 MG PO TABS: 5.0000 mg | ORAL_TABLET | Freq: Once | ORAL | Status: DC | PRN

## 2019-12-19 MED ORDER — ACETAMINOPHEN 10 MG/ML IV SOLN
INTRAVENOUS | Status: DC | PRN
  Administered 2019-12-19: 1000 mg via INTRAVENOUS

## 2019-12-19 MED ORDER — SCOPOLAMINE 1 MG/3DAYS TD PT72
MEDICATED_PATCH | TRANSDERMAL | Status: AC
  Filled 2019-12-19: qty 1

## 2019-12-19 SURGICAL SUPPLY — 33 items
CANISTER SUCT 1200ML W/VALVE (MISCELLANEOUS) ×2 IMPLANT
CHLORAPREP W/TINT 26 (MISCELLANEOUS) ×2 IMPLANT
COVER WAND RF STERILE (DRAPES) ×2 IMPLANT
DERMABOND ADVANCED (GAUZE/BANDAGES/DRESSINGS) ×1
DERMABOND ADVANCED .7 DNX12 (GAUZE/BANDAGES/DRESSINGS) ×1 IMPLANT
DEVICE DUBIN SPECIMEN MAMMOGRA (MISCELLANEOUS) ×2 IMPLANT
DRAPE LAPAROTOMY 77X122 PED (DRAPES) ×2 IMPLANT
ELECT REM PT RETURN 9FT ADLT (ELECTROSURGICAL) ×2
ELECTRODE REM PT RTRN 9FT ADLT (ELECTROSURGICAL) ×1 IMPLANT
GLOVE BIO SURGEON STRL SZ 6.5 (GLOVE) ×2 IMPLANT
GLOVE BIOGEL PI IND STRL 6.5 (GLOVE) ×1 IMPLANT
GLOVE BIOGEL PI INDICATOR 6.5 (GLOVE) ×1
GOWN STRL REUS W/ TWL LRG LVL3 (GOWN DISPOSABLE) ×2 IMPLANT
GOWN STRL REUS W/TWL LRG LVL3 (GOWN DISPOSABLE) ×2
KIT MARKER MARGIN INK (KITS) ×2 IMPLANT
KIT TURNOVER KIT A (KITS) ×2 IMPLANT
LABEL OR SOLS (LABEL) ×2 IMPLANT
MARGIN MAP 10MM (MISCELLANEOUS) ×2 IMPLANT
NEEDLE HYPO 25X1 1.5 SAFETY (NEEDLE) ×2 IMPLANT
PACK BASIN MINOR ARMC (MISCELLANEOUS) ×2 IMPLANT
RETRACTOR RING XSMALL (MISCELLANEOUS) ×1 IMPLANT
RTRCTR WOUND ALEXIS 13CM XS SH (MISCELLANEOUS) ×2
SLEVE PROBE SENORX GAMMA FIND (MISCELLANEOUS) ×2 IMPLANT
SUT ETHILON 3-0 FS-10 30 BLK (SUTURE) ×2
SUT MNCRL 4-0 (SUTURE) ×1
SUT MNCRL 4-0 27XMFL (SUTURE) ×1
SUT SILK 2 0 SH (SUTURE) IMPLANT
SUT VIC AB 3-0 SH 27 (SUTURE) ×1
SUT VIC AB 3-0 SH 27X BRD (SUTURE) ×1 IMPLANT
SUTURE EHLN 3-0 FS-10 30 BLK (SUTURE) ×1 IMPLANT
SUTURE MNCRL 4-0 27XMF (SUTURE) ×1 IMPLANT
SYR 10ML LL (SYRINGE) ×2 IMPLANT
WATER STERILE IRR 1000ML POUR (IV SOLUTION) ×2 IMPLANT

## 2019-12-19 NOTE — Discharge Instructions (Signed)
AMBULATORY SURGERY  DISCHARGE INSTRUCTIONS   1) The drugs that you were given will stay in your system until tomorrow so for the next 24 hours you should not:  A) Drive an automobile B) Make any legal decisions C) Drink any alcoholic beverage   2) You may resume regular meals tomorrow.  Today it is better to start with liquids and gradually work up to solid foods.  You may eat anything you prefer, but it is better to start with liquids, then soup and crackers, and gradually work up to solid foods.   3) Please notify your doctor immediately if you have any unusual bleeding, trouble breathing, redness and pain at the surgery site, drainage, fever, or pain not relieved by medication.    4) Additional Instructions:        Please contact your physician with any problems or Same Day Surgery at [phone], Monday through Friday 6 am to 4 pm, or [place] [facility] [place] Main number at [phone]. Diet: Resume home heart healthy regular diet.   Activity: Increase activity as tolerated. Light activity and walking are encouraged. Do not drive or drink alcohol if taking narcotic pain medications.  Wound care: May shower with soapy water and pat dry (do not rub incisions), but no baths or submerging incision underwater until follow-up. (no swimming)   Medications: Resume all home medications. For mild to moderate pain: acetaminophen (Tylenol) or ibuprofen (if no kidney disease). Combining Tylenol with alcohol can substantially increase your risk of causing liver disease. Narcotic pain medications, if prescribed, can be used for severe pain, though may cause nausea, constipation, and drowsiness. Do not combine Tylenol and Norco within a 6 hour period as Norco contains Tylenol. If you do not need the narcotic pain medication, you do not need to fill the prescription.  Call office ([phone]) at any time if any questions, worsening pain, fevers/chills, bleeding, drainage from incision  site, or other concerns.  

## 2019-12-19 NOTE — Anesthesia Preprocedure Evaluation (Addendum)
Anesthesia Evaluation  Patient identified by MRN, date of birth, ID band Patient awake    Reviewed: Allergy & Precautions, H&P , NPO status , Patient's Chart, lab work & pertinent test results  History of Anesthesia Complications (+) PONVHistory of anesthetic complications: Pt reports PONV after every anesthetic.  Airway Mallampati: II  TM Distance: >3 FB Neck ROM: full    Dental  (+) Teeth Intact   Pulmonary asthma , sleep apnea ,           Cardiovascular hypertension, (-) angina(-) Past MI      Neuro/Psych  Headaches, negative psych ROS   GI/Hepatic Neg liver ROS, GERD  ,  Endo/Other  diabetes, Type 2, Oral Hypoglycemic AgentsMorbid obesity  Renal/GU      Musculoskeletal   Abdominal   Peds  Hematology negative hematology ROS (+)   Anesthesia Other Findings Past Medical History: No date: Anemia No date: Arthritis No date: Asthma     Comment:  WELL CONTROLLED No date: Cancer [facility])     Comment:  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 [id number] No date: Complication of anesthesia No date: DDD (degenerative disc disease), cervical     Comment:  AND SPINE No date: Diabetes mellitus without complication (HCC) No date: Elevated lipids No date: Family history of breast cancer No date: Family history of colon cancer No date: Family history of uterine cancer No date: Fatty liver     Comment:  H/O No date: GERD (gastroesophageal reflux disease) No date: History of kidney stones     Comment:  H/O No date: IBS (irritable bowel syndrome) No date: IBS (irritable bowel syndrome) No date: Migraines No date: Obesity No date: PONV (postoperative nausea and vomiting)     Comment:  [employer] WORKS  No date: Seasonal allergies No date: Sleep apnea     Comment:  USES CPAP  Past Surgical History: No date: ABDOMINAL HYSTERECTOMY 11/17/2019: BREAST BIOPSY; Right     Comment:  affirm bx, coil marker, DUCTAL CARCINOMA IN  SITU,               INTERMEDIATE GRADE No date: CHOLECYSTECTOMY 11/25/2016: COLONOSCOPY WITH PROPOFOL; N/A     Comment:  Procedure: COLONOSCOPY WITH PROPOFOL;  Surgeon: Lollie Sails, MD;  Location: [facility] ENDOSCOPY;  Service:               Endoscopy;  Laterality: N/A; 11/25/2016: ESOPHAGOGASTRODUODENOSCOPY (EGD) WITH PROPOFOL; N/A     Comment:  Procedure: ESOPHAGOGASTRODUODENOSCOPY (EGD) WITH               PROPOFOL;  Surgeon: Lollie Sails, MD;  Location:               [facility] ENDOSCOPY;  Service: Endoscopy;  Laterality: N/A; No date: EXCISION OF ENDOMETRIOMA No date: TONSILLECTOMY     Reproductive/Obstetrics negative OB ROS                           Anesthesia Physical Anesthesia Plan  ASA: III  Anesthesia Plan: General ETT   Post-op Pain Management:    Induction:   PONV Risk Score and Plan: Ondansetron, Dexamethasone, Midazolam, Treatment may vary due to age or medical condition, Scopolamine patch - Pre-op, Propofol infusion and TIVA  Airway Management Planned:   Additional Equipment:   Intra-op Plan:   Post-operative Plan:   Informed Consent: I have reviewed the patients  History and Physical, chart, labs and discussed the procedure including the risks, benefits and alternatives for the proposed anesthesia with the patient or authorized representative who has indicated his/her understanding and acceptance.     Dental Advisory Given  Plan Discussed with: Anesthesiologist  Anesthesia Plan Comments:        Anesthesia Quick Evaluation

## 2019-12-19 NOTE — Op Note (Signed)
Preoperative diagnosis: Right breast carcinoma.  Postoperative diagnosis: Right breast carcinoma.   Procedure: Right needle-localized partial mastectomy.                      Anesthesia: GETA  Surgeon: Dr. Windell Moment  Wound Classification: Clean  Indications: Patient is a 59 y.o. 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  Requires needle-localized partial mastectomy for treatment.   Findings: 1. Specimen mammography shows marker and wire on specimen 2. Pathology call refers gross examination of margins was grossly negative 3. No other palpable mass or lymph node identified.   Description of procedure: Preoperative needle localization was performed by radiology.  Localization studies were reviewed. The patient was taken to the operating room and placed supine on the operating table, and after general anesthesia the right chest and axilla were prepped and draped in the usual sterile fashion. A time-out was completed verifying correct patient, procedure, site, positioning, and implant(s) and/or special equipment prior to beginning this procedure.  By comparing the localization studies with the direction and skin entry site of the needle, the probable trajectory and location of the mass was visualized. A circumareolar skin incision was planned in such a way as to minimize the amount of dissection to reach the mass.  The skin incision was made. Flaps were raised and the location of the wire confirmed. The wire was delivered into the wound. A 2-0 silk figure-of-eight stay suture was placed around the wire and used for retraction. Dissection was then taken down circumferentially, taking care to include the entire localizing needle and a wide margin of grossly normal tissue. The specimen and entire localizing wire were removed. The specimen was oriented and sent to radiology with the localization studies.  Confirmation was received that the entire target lesion had been resected. The wound was irrigated. Hemostasis was checked. The wound was closed with interrupted sutures of 3-0 Vicryl and a subcuticular suture of Monocryl 3-0. No attempt was made to close the dead space. A dressing was applied.  The patient tolerated the procedure well and was taken to the postanesthesia care unit in stable condition.   Specimen: Right Breast partial mastectomy   Complications: None  Estimated Blood Loss: 10 mL

## 2019-12-19 NOTE — Transfer of Care (Signed)
Immediate Anesthesia Transfer of Care Note  Patient: Diane Cox  Procedure(s) Performed: PARTIAL MASTECTOMY WITH NEEDLE LOCALIZATION (Right Breast)  Patient Location: PACU  Anesthesia Type:General  Level of Consciousness: drowsy, patient cooperative and responds to stimulation  Airway & Oxygen Therapy: Patient Spontanous Breathing and Patient connected to face mask oxygen  Post-op Assessment: Report given to RN and Post -op Vital signs reviewed and stable  Post vital signs: Reviewed and stable  Last Vitals:  Vitals Value Taken Time  BP    Temp    Pulse    Resp    SpO2      Last Pain:  Vitals:   12/19/19 0912  TempSrc: Temporal  PainSc: 0-No pain         Complications: No apparent anesthesia complications

## 2019-12-19 NOTE — Anesthesia Procedure Notes (Signed)
Procedure Name: Intubation Performed by: Kelton Pillar, CRNA Pre-anesthesia Checklist: Patient identified, Emergency Drugs available, Suction available and Patient being monitored Patient Re-evaluated:Patient Re-evaluated prior to induction Oxygen Delivery Method: Circle system utilized Preoxygenation: Pre-oxygenation with 100% oxygen Induction Type: IV induction Ventilation: Mask ventilation without difficulty Laryngoscope Size: McGraph and 3 Grade View: Grade I Tube type: Oral Tube size: 7.0 mm Number of attempts: 1 Airway Equipment and Method: Stylet Placement Confirmation: ETT inserted through vocal cords under direct vision,  positive ETCO2,  CO2 detector and breath sounds checked- equal and bilateral Tube secured with: Tape Dental Injury: Teeth and Oropharynx as per pre-operative assessment

## 2019-12-19 NOTE — Interval H&P Note (Signed)
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  The various methods of treatment have been discussed with the patient and family. After consideration of risks, benefits and other options for treatment, the patient has consented to  Procedure(s): PARTIAL MASTECTOMY WITH NEEDLE LOCALIZATION (Right) as a surgical intervention.  The patient's history has been reviewed, patient examined, no change in status, stable for surgery.  I have reviewed the patient's chart and labs.  Right breast marked in the pre procedure room. Questions were answered to the patient's satisfaction.     Herbert Pun

## 2019-12-20 ENCOUNTER — Encounter: Payer: Self-pay | Admitting: Licensed Clinical Social Worker

## 2019-12-20 ENCOUNTER — Telehealth: Payer: Self-pay | Admitting: Licensed Clinical Social Worker

## 2019-12-20 ENCOUNTER — Ambulatory Visit: Payer: Self-pay | Admitting: Licensed Clinical Social Worker

## 2019-12-20 DIAGNOSIS — Z800 Family history of malignant neoplasm of digestive organs: Secondary | ICD-10-CM

## 2019-12-20 DIAGNOSIS — Z1379 Encounter for other screening for genetic and chromosomal anomalies: Secondary | ICD-10-CM

## 2019-12-20 DIAGNOSIS — Z8049 Family history of malignant neoplasm of other genital organs: Secondary | ICD-10-CM

## 2019-12-20 DIAGNOSIS — D0511 Intraductal carcinoma in situ of right breast: Secondary | ICD-10-CM

## 2019-12-20 DIAGNOSIS — Z803 Family history of malignant neoplasm of breast: Secondary | ICD-10-CM

## 2019-12-20 NOTE — Telephone Encounter (Signed)
Revealed negative genetic testing.   We discussed that we do not know why she has breast cancer or why there is cancer in the family. It could be due to a different gene that we are not testing, or something our current technology cannot pick up.  It will be important for her to keep in contact with genetics to learn if additional testing may be needed in the future.  

## 2019-12-20 NOTE — Progress Notes (Signed)
HPI:  Ms. Kuklinski was previously seen in the [place] clinic due to a personal and family history of cancer and concerns regarding a hereditary predisposition to cancer. Please refer to our prior cancer genetics clinic note for more information regarding our discussion, assessment and recommendations, at the time. Ms. Naab recent genetic test results were disclosed to her, as were recommendations warranted by these results. These results and recommendations are discussed in more detail below.  CANCER HISTORY:  Oncology History  Ductal carcinoma in situ (DCIS) of right breast  11/25/2019 Initial Diagnosis   Ductal carcinoma in situ (DCIS) of right breast   11/25/2019 Cancer Staging   Staging form: Breast, AJCC 8th Edition - Clinical stage from 11/25/2019: Stage 0 (cTis (DCIS), cN0, cM0, ER+, PR+, HER2-) - Signed by Lloyd Huger, MD on 11/25/2019   12/20/2019 Genetic Testing   Negative genetic testing. No pathogenic variants identified on the [facility] Breast Cancer STAT Panel + Common Hereditary Cancers Panel. The report date is 12/20/2019.   The STAT Breast cancer panel offered by [facility] includes sequencing and rearrangement analysis for the following 9 genes:  ATM, BRCA1, BRCA2, CDH1, CHEK2, PALB2, PTEN, STK11 and TP53.    The Common Hereditary Cancers Panel offered by [facility] includes sequencing and/or deletion duplication testing of the following 48 genes: APC, ATM, AXIN2, BARD1, BMPR1A, BRCA1, BRCA2, BRIP1, CDH1, CDKN2A (p14ARF), CDKN2A (p16INK4a), CKD4, CHEK2, CTNNA1, DICER1, EPCAM (Deletion/duplication testing only), GREM1 (promoter region deletion/duplication testing only), KIT, MEN1, MLH1, MSH2, MSH3, MSH6, MUTYH, NBN, NF1, NHTL1, PALB2, PDGFRA, PMS2, POLD1, POLE, PTEN, RAD50, RAD51C, RAD51D, RNF43, SDHB, SDHC, SDHD, SMAD4, SMARCA4. STK11, TP53, TSC1, TSC2, and VHL.  The following genes were evaluated for sequence changes only: SDHA and HOXB13 c.251G>A variant only.        FAMILY HISTORY:  We obtained a detailed, 4-generation family history.  Significant diagnoses are listed below: Family History  Problem Relation Age of Onset  . Heart attack Father   . Uterine cancer Mother        dx 64s  . Breast cancer Maternal Aunt        dx 68s, recurrence in 13s  . Uterine cancer Maternal Grandmother        dx 50s-60s  . Uterine cancer Sister        dx 35s  . Bone cancer Brother        dx 33s  . Colon cancer Maternal Aunt   . Stomach cancer Maternal Aunt   . Throat cancer Maternal Aunt   . Colon cancer Cousin    Ms. Schermer does not have children. She has 2 sisters and 1 brother. Her sister had endometrial cancer in her 11s and is living at 13. Her brother had bone cancer in his 79s, possibly due to exposures in the [employer].   Ms. Brandle mother was diagnosed with uterine cancer in her 76s. She also had precancerous cells in her breasts and had a double mastectomy in the 1980s, patient is not sure why. She passed from Alzheimer's at 98. Patient had 4 maternal aunts and 3 maternal uncles. One aunt had breast cancer in her 13s and a recurrence in her 47s. Another aunt had colon cancer, unsure age of diagnosis. Another aunt had throat cancer and the final aunt had stomach cancer. A maternal cousin (daughter of the aunt with colon cancer) also had colon cancer and is deceased. Maternal grandmother had uterine cancer in her 33s-60s and died in her early 58s. Maternal  grandfather passed in his 64s.  Ms. [place] father died in his 62s due to a heart attack. Patient had 1 paternal aunt and 1 paternal uncle, no cancers. She has a paternal cousin who might have had eye cancer. Both paternal grandparents passed in their 58s, no cancers.  Ms. Sonntag is unaware of previous family history of genetic testing for hereditary cancer risks. Patient's maternal ancestors are of European descent, and paternal ancestors are of Italian/Hungarian descent. There is no reported Ashkenazi  Jewish ancestry. There is no known consanguinity.  GENETIC TEST RESULTS: Genetic testing reported out on 12/20/2019 through the [facility] Breast Cancer STAT panel + Common Hereditary cancer panel found no pathogenic mutations.   The STAT Breast cancer panel offered by [facility] includes sequencing and rearrangement analysis for the following 9 genes:  ATM, BRCA1, BRCA2, CDH1, CHEK2, PALB2, PTEN, STK11 and TP53.    The Common Hereditary Cancers Panel offered by [facility] includes sequencing and/or deletion duplication testing of the following 48 genes: APC, ATM, AXIN2, BARD1, BMPR1A, BRCA1, BRCA2, BRIP1, CDH1, CDKN2A (p14ARF), CDKN2A (p16INK4a), CKD4, CHEK2, CTNNA1, DICER1, EPCAM (Deletion/duplication testing only), GREM1 (promoter region deletion/duplication testing only), KIT, MEN1, MLH1, MSH2, MSH3, MSH6, MUTYH, NBN, NF1, NHTL1, PALB2, PDGFRA, PMS2, POLD1, POLE, PTEN, RAD50, RAD51C, RAD51D, RNF43, SDHB, SDHC, SDHD, SMAD4, SMARCA4. STK11, TP53, TSC1, TSC2, and VHL.  The following genes were evaluated for sequence changes only: SDHA and HOXB13 c.251G>A variant only.  The test report has been scanned into EPIC and is located under the Molecular Pathology section of the Results Review tab.  A portion of the result report is included below for reference.     We discussed with Ms. Dutkiewicz that because current genetic testing is not perfect, it is possible there may be a gene mutation in one of these genes that current testing cannot detect, but that chance is small.  We also discussed, that there could be another gene that has not yet been discovered, or that we have not yet tested, that is responsible for the cancer diagnoses in the family. It is also possible there is a hereditary cause for the cancer in the family that Ms. Wallen did not inherit and therefore was not identified in her testing.  Therefore, it is important to remain in touch with cancer genetics in the future so that we can continue to offer Ms.  Bordenave the most up to date genetic testing.   ADDITIONAL GENETIC TESTING: We discussed with Ms. Enis that her genetic testing was fairly extensive.  If there are genes identified to increase cancer risk that can be analyzed in the future, we would be happy to discuss and coordinate this testing at that time.    CANCER SCREENING RECOMMENDATIONS: Ms. Mathia test result is considered negative (normal).  This means that we have not identified a hereditary cause for her personal and family history of cancer at this time. Most cancers happen by chance and this negative test suggests that her cancer may fall into this category.    While reassuring, this does not definitively rule out a hereditary predisposition to cancer. It is still possible that there could be genetic mutations that are undetectable by current technology. There could be genetic mutations in genes that have not been tested or identified to increase cancer risk.  Therefore, it is recommended she continue to follow the cancer management and screening guidelines provided by her oncology and primary healthcare provider.   An individual's cancer risk and medical management are not determined  by genetic test results alone. Overall cancer risk assessment incorporates additional factors, including personal medical history, family history, and any available genetic information that may result in a personalized plan for cancer prevention and surveillance.  RECOMMENDATIONS FOR FAMILY MEMBERS:  Relatives in this family might be at some increased risk of developing cancer, over the general population risk, simply due to the family history of cancer.  We recommended female relatives in this family have a yearly mammogram beginning at age 22, or 53 years younger than the earliest onset of cancer, an annual clinical breast exam, and perform monthly breast self-exams. Female relatives in this family should also have a gynecological exam as recommended by their  primary provider. All family members should have a colonoscopy by age 46, or as directed by their physicians.  It is also possible there is a hereditary cause for the cancer in Ms. Sharber's family that she did not inherit and therefore was not identified in her.  Based on Ms. Damico's family history, we recommended her sister who had uterine cancer and maternal relatives have genetic counseling and testing. Ms. Linarez will let us know if we can be of any assistance in coordinating genetic counseling and/or testing for these family members.  FOLLOW-UP: Lastly, we discussed with Ms. Fake that cancer genetics is a rapidly advancing field and it is possible that new genetic tests will be appropriate for her and/or her family members in the future. We encouraged her to remain in contact with cancer genetics on an annual basis so we can update her personal and family histories and let her know of advances in cancer genetics that may benefit this family.   Our contact number was provided. Ms. Logiudice questions were answered to her satisfaction, and she knows she is welcome to call us at anytime with additional questions or concerns.   Faith Rogue, MS, [facility] Genetic Counselor [email or web address] Phone: [phone]

## 2019-12-21 LAB — SURGICAL PATHOLOGY

## 2019-12-21 NOTE — Anesthesia Postprocedure Evaluation (Signed)
Anesthesia Post Note  Patient: Diane Cox  Procedure(s) Performed: PARTIAL MASTECTOMY WITH NEEDLE LOCALIZATION (Right Breast)  Patient location during evaluation: PACU Anesthesia Type: General Level of consciousness: awake and alert Pain management: pain level controlled Vital Signs Assessment: post-procedure vital signs reviewed and stable Respiratory status: spontaneous breathing, nonlabored ventilation and respiratory function stable Cardiovascular status: blood pressure returned to baseline and stable Postop Assessment: no apparent nausea or vomiting Anesthetic complications: no     Last Vitals:  Vitals:   12/19/19 1318 12/19/19 1355  BP: (!) 123/48 110/60  Pulse: 94 79  Resp: 14 16  Temp: (!) 36.2 C   SpO2: 95% 97%    Last Pain:  Vitals:   12/20/19 0759  TempSrc:   PainSc: 0-No pain                 Tera Mater

## 2019-12-23 NOTE — Progress Notes (Signed)
[place]  [phone] [phone]  ID: Diane Cox OB: Mar 16, 1961  MR#: [id number]  XBW#:[id number]  Patient Care Team: [name], MD as PCP - General (Family Medicine) Theodore Demark, RN as Registered Nurse (Oncology) Rico Junker, RN as Registered Nurse (Oncology)  CHIEF COMPLAINT: Right breast DCIS  INTERVAL HISTORY: Patient returns to clinic today for further evaluation, discussion of her final pathology results, and treatment planning.  She tolerated her lumpectomy well without significant side effects.  She currently feels well and is asymptomatic. She has no neurologic complaints.  She denies any recent fevers or illnesses.  She has a good appetite and denies weight loss.  She has no chest pain, shortness of breath, cough, or hemoptysis.  She denies any nausea, vomiting, constipation, or diarrhea.  She has no urinary complaints.  Patient offers no specific complaints today.  REVIEW OF SYSTEMS:   Review of Systems  Constitutional: Negative.  Negative for fever, malaise/fatigue and weight loss.  Respiratory: Negative.  Negative for cough, hemoptysis and shortness of breath.   Cardiovascular: Negative.  Negative for chest pain and leg swelling.  Gastrointestinal: Negative.  Negative for abdominal pain.  Genitourinary: Negative.  Negative for dysuria.  Musculoskeletal: Negative.  Negative for back pain.  Skin: Negative.  Negative for rash.  Neurological: Negative.  Negative for dizziness, focal weakness, weakness and headaches.  Psychiatric/Behavioral: Negative.  The patient is not nervous/anxious.     As per HPI. Otherwise, a complete review of systems is negative.  PAST MEDICAL HISTORY: Past Medical History:  Diagnosis Date  . Anemia   . Arthritis   . Asthma    WELL CONTROLLED  . Cancer ([place])    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 11/25/19  . Complication of anesthesia   . DDD (degenerative disc disease), cervical      AND SPINE  . Diabetes mellitus without complication ([place])   . Elevated lipids   . Family history of breast cancer   . Family history of colon cancer   . Family history of uterine cancer   . Fatty liver    H/O  . GERD (gastroesophageal reflux disease)   . History of kidney stones    H/O  . IBS (irritable bowel syndrome)   . IBS (irritable bowel syndrome)   . Migraines   . Obesity   . 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 Seasonal allergies   . Sleep apnea    USES CPAP    PAST SURGICAL HISTORY: Past Surgical History:  Procedure Laterality Date  . ABDOMINAL HYSTERECTOMY    . BREAST BIOPSY Right 11/17/2019   affirm bx, coil marker, DUCTAL CARCINOMA IN SITU, INTERMEDIATE GRADE  . CHOLECYSTECTOMY    . COLONOSCOPY WITH PROPOFOL N/A 11/25/2016   Procedure: COLONOSCOPY WITH PROPOFOL;  Surgeon: Lollie Sails, MD;  [facility] ENDOSCOPY;  Service: Endoscopy;  Laterality: N/A;  . ESOPHAGOGASTRODUODENOSCOPY (EGD) WITH PROPOFOL N/A 11/25/2016   Procedure: ESOPHAGOGASTRODUODENOSCOPY (EGD) WITH PROPOFOL;  Surgeon: Lollie Sails, MD;  Location: [facility] ENDOSCOPY;  Service: Endoscopy;  Laterality: N/A;  . EXCISION OF ENDOMETRIOMA    . PARTIAL MASTECTOMY WITH NEEDLE LOCALIZATION Right 12/19/2019   Procedure: PARTIAL MASTECTOMY WITH NEEDLE LOCALIZATION;  Surgeon: Herbert Pun, MD;  Location: [facility] ORS;  Service: General;  Laterality: Right;  . TONSILLECTOMY      FAMILY HISTORY: Family History  Problem Relation Age of Onset  . Heart attack Father   .  Uterine cancer Mother        dx 78s  . Breast cancer Maternal Aunt        dx 14s, recurrence in 21s  . Uterine cancer Maternal Grandmother        dx 50s-60s  . Uterine cancer Sister        dx 39s  . Bone cancer Brother        dx 48s  . Colon cancer Maternal Aunt   . Stomach cancer Maternal Aunt   . Throat cancer Maternal Aunt   . Colon cancer Cousin     ADVANCED DIRECTIVES (Y/N):   N  HEALTH MAINTENANCE: Social History   Tobacco Use  . Smoking status: Never Smoker  . Smokeless tobacco: Never Used  Substance Use Topics  . Alcohol use: Yes    Alcohol/week: 0.0 standard drinks    Comment: occasional  . Drug use: No     Colonoscopy:  PAP:  Bone density:  Lipid panel:  Allergies  Allergen Reactions  . Other     BLOOD THINNERS-THE COMPONENT IN BLOOD THINNERS CAUSE A RASH AT INJECTION SITE  . Chicken Allergy Rash  . Compazine [Prochlorperazine Edisylate] Nausea And Vomiting and Anxiety  . Pork-Derived Products Rash    Current Outpatient Medications  Medication Sig Dispense Refill  . albuterol (PROVENTIL HFA;VENTOLIN HFA) 108 (90 BASE) MCG/ACT inhaler Inhale 4-6 puffs by mouth every 4 hours as needed for wheezing, cough, and/or shortness of breath (Patient taking differently: Inhale 1-2 puffs into the lungs every 4 (four) hours as needed (wheeze, cough and/or shortness of breath). ) 1 Inhaler 1  . atorvastatin (LIPITOR) 20 MG tablet Take 20 mg by mouth at bedtime.     . benzonatate (TESSALON) 100 MG capsule Take 100 mg by mouth 3 (three) times daily as needed for cough.     Marland Kitchen BREO ELLIPTA 100-25 MCG/INH AEPB Inhale 1 puff into the lungs daily as needed (respiratory issues.).     Marland Kitchen carisoprodol (SOMA) 350 MG tablet Take 350 mg by mouth daily as needed (severe neck pain/stiffiness).    . cetirizine (ZYRTEC) 10 MG tablet Take 10 mg by mouth every evening.    . Cholecalciferol (VITAMIN D-3) 125 MCG (5000 UT) TABS Take 5,000 Units by mouth at bedtime.    . dicyclomine (BENTYL) 10 MG capsule Take 10 mg by mouth 3 (three) times daily as needed for spasms (IBS).     Marland Kitchen escitalopram (LEXAPRO) 10 MG tablet Take 10 mg by mouth every morning.     . fluticasone (FLONASE) 50 MCG/ACT nasal spray Place 2 sprays into the nose daily as needed (allergies.).     Marland Kitchen gabapentin (NEURONTIN) 100 MG capsule Take 200 mg by mouth 2 (two) times daily as needed (arthritis pain.).     Marland Kitchen  glipiZIDE (GLUCOTROL XL) 10 MG 24 hr tablet Take 10 tablets by mouth 2 (two) times daily.    . hydrochlorothiazide (MICROZIDE) 12.5 MG capsule Take 12.5 mg by mouth daily.     . hyoscyamine (LEVSIN) 0.125 MG tablet Take 0.125 mg by mouth every 6 (six) hours as needed. PT HAS NOT PICKED UP RX AS OF 12-14-19    . Magnesium Oxide (MAG-200 PO) Take 200 mg by mouth at bedtime.    . Methylcellulose, Laxative, (CITRUCEL) 500 MG TABS Take 500 mg by mouth daily.    . montelukast (SINGULAIR) 10 MG tablet Take 10 mg by mouth every morning.     . pantoprazole (PROTONIX) 40 MG tablet  Take 40 mg by mouth every morning.     . Potassium 99 MG TABS Take 99 mg by mouth daily.     . Probiotic Product (DIGESTIVE ADVANTAGE GUMMIES PO) Take 2 tablets by mouth every evening.    . sucralfate (CARAFATE) 1 G tablet Take 1 g by mouth 2 (two) times daily.     Marland Kitchen topiramate (TOPAMAX) 25 MG tablet Take 25 mg by mouth 2 (two) times daily.     No current facility-administered medications for this visit.    OBJECTIVE: Vitals:   12/30/19 1014  BP: 126/79  Pulse: 84  Resp: 18  Temp: (!) 97.1 F (36.2 C)     Body mass index is 42.76 kg/m.    ECOG FS:0 - Asymptomatic  General: Well-developed, well-nourished, no acute distress. Eyes: Pink conjunctiva, anicteric sclera. HEENT: Normocephalic, moist mucous membranes. Breast: Patient reports she was evaluated by surgery yesterday with a well-healing surgical scar. Lungs: No audible wheezing or coughing. Heart: Regular rate and rhythm. Abdomen: Soft, nontender, no obvious distention. Musculoskeletal: No edema, cyanosis, or clubbing. Neuro: Alert, answering all questions appropriately. Cranial nerves grossly intact. Skin: No rashes or petechiae noted. Psych: Normal affect.    LAB RESULTS:  Lab Results  Component Value Date   NA 136 06/17/2019   K 3.3 (L) 06/17/2019   CL 97 (L) 06/17/2019   CO2 28 06/17/2019   GLUCOSE 256 (H) 06/17/2019   BUN 17 06/17/2019    CREATININE 0.66 06/17/2019   CALCIUM 9.0 06/17/2019   PROT 7.7 06/17/2019   ALBUMIN 4.1 06/17/2019   AST 18 06/17/2019   ALT 25 06/17/2019   ALKPHOS 136 (H) 06/17/2019   BILITOT 0.2 (L) 06/17/2019   GFRNONAA >60 06/17/2019   GFRAA >60 06/17/2019    Lab Results  Component Value Date   WBC 13.3 (H) 06/17/2019   HGB 12.4 06/17/2019   HCT 38.1 06/17/2019   MCV 89.6 06/17/2019   PLT 340 06/17/2019     STUDIES: CT ABDOMEN PELVIS W CONTRAST  Result Date: 12/14/2019 CLINICAL DATA:  Upper abdominal pain, right lower quadrant abdominal pain. Nausea. Pain for 6 months. New diagnosis of right breast cancer. EXAM: CT ABDOMEN AND PELVIS WITH CONTRAST TECHNIQUE: Multidetector CT imaging of the abdomen and pelvis was performed using the standard protocol following bolus administration of intravenous contrast. CONTRAST:  179m OMNIPAQUE IOHEXOL 300 MG/ML  SOLN COMPARISON:  Abdominal CT 04/14/2016. Abdominal MRI 06/03/2016 FINDINGS: Lower chest: No focal airspace disease, pleural fluid, or pulmonary nodule at the basis. Hepatobiliary: Diffusely decreased density consistent with steatosis. More focal fatty deposition again seen in the central liver adjacent to the gallbladder fossa, previously characterized on MRI and not significantly changed. Liver is prominent size spanning 20 cm cranial caudal. No new or suspicious hepatic lesion. Clips in the gallbladder fossa postcholecystectomy. No biliary dilatation. Pancreas: Unremarkable. No pancreatic ductal dilatation or surrounding inflammatory changes. No evidence of pancreatic mass. Spleen: Normal in size without focal abnormality. Splenule at the hilum and inferiorly, unchanged. Adrenals/Urinary Tract: Normal adrenal glands, no adrenal nodule. No hydronephrosis or perinephric edema. Homogeneous renal enhancement with symmetric excretion on delayed phase imaging. Urinary bladder is physiologically distended without wall thickening. Stomach/Bowel: The stomach is  unremarkable. Normal positioning of the ligament of Treitz. No small bowel obstruction or inflammation. Administered enteric contrast reaches the colon. Terminal ileum is normal. Normal appendix, for example series 2, image 61. Moderate stool burden in the colon. No colonic wall thickening or inflammatory change. Sigmoid colon is tortuous.  No significant diverticular disease. Vascular/Lymphatic: Aorto bi-iliac atherosclerosis. No aortic aneurysm. The portal vein is patent. Few prominent periportal and peripancreatic nodes are unchanged from prior exam, likely reactive. Largest portal caval node measures 10 mm, series 2, image 27. No enlarged lymph nodes by size criteria. Reproductive: Hysterectomy. Tiny 10 mm cyst in the left ovary is unchanged from prior exam, considered benign. No suspicious adnexal mass. Right ovary tentatively visualized and quiescent. Other: No free air, free fluid, or intra-abdominal fluid collection. Musculoskeletal: There are no acute or suspicious osseous abnormalities. Subcutaneous soft tissues are unremarkable. IMPRESSION: 1. Enlarged liver with hepatic steatosis, this can produce right upper quadrant pain. More focal fatty deposition of the central liver, unchanged from prior exam and previously characterized on MRI. 2. No acute findings in the abdomen or pelvis. Particularly, normal appendix. Aortic Atherosclerosis (ICD10-I70.0). Electronically Signed   By: Keith Rake M.D.   On: 12/14/2019 14:30   MM Breast Surgical Specimen  Result Date: 12/19/2019 CLINICAL DATA:  Right lumpectomy for recently diagnosed DCIS. EXAM: SPECIMEN RADIOGRAPH OF THE RIGHT BREAST COMPARISON:  Previous exam(s). FINDINGS: Status post excision of the right breast. The wire tip and coil shaped biopsy marker clip are present. IMPRESSION: Specimen radiograph of the right breast. Electronically Signed   By: Claudie Revering M.D.   On: 12/19/2019 10:50   MM RT PLC BREAST LOC DEV   1ST LESION  INC MAMMO  GUIDE  Result Date: 12/19/2019 CLINICAL DATA:  Pre lumpectomy localization of recently diagnosed DCIS in the lateral right breast. EXAM: NEEDLE LOCALIZATION OF THE RIGHT BREAST WITH MAMMO GUIDANCE COMPARISON:  Previous exams. FINDINGS: Patient presents for needle localization prior to right lumpectomy. I met with the patient and we discussed the procedure of needle localization including benefits and alternatives. We discussed the high likelihood of a successful procedure. We discussed the risks of the procedure, including infection, bleeding, tissue injury, and further surgery. Informed, written consent was given. The usual time-out protocol was performed immediately prior to the procedure. Using mammographic guidance, sterile technique, 1% lidocaine and a 9 modified Kopans needle, the recently placed coil shaped biopsy marker clip in the lateral right breast localized using lateral approach. The images were marked for Dr. Windell Moment. IMPRESSION: Needle localization right breast. No apparent complications. Electronically Signed   By: Claudie Revering M.D.   On: 12/19/2019 08:43    ASSESSMENT: Right breast DCIS.  PLAN:    1.  Right breast DCIS: Patient underwent lumpectomy on December 19, 2019 confirming diagnosis.  Previously, she declined enrollment in the COMET trial.  She was given a referral to radiation oncology to discuss adjuvant XRT.  Patient will benefit from 5 years of tamoxifen at the conclusion of her XRT and will return to clinic the first week of May to initiate treatment.  No further interventions are needed at this time. 2.  Genetics: Negative.   I spent a total of 30 minutes reviewing chart data, face-to-face evaluation with the patient, counseling and coordination of care as detailed above.   Patient expressed understanding and was in agreement with this plan. She also understands that She can call clinic at any time with any questions, concerns, or complaints.   Cancer  Staging Ductal carcinoma in situ (DCIS) of right breast Staging form: Breast, AJCC 8th Edition - Clinical stage from 11/25/2019: Stage 0 (cTis (DCIS), cN0, cM0, ER+, PR+, HER2-) - Signed by Lloyd Huger, MD on 11/25/2019   Lloyd Huger, MD   12/30/2019 5:11 PM

## 2019-12-29 ENCOUNTER — Other Ambulatory Visit: Payer: Self-pay

## 2019-12-29 ENCOUNTER — Encounter: Payer: Self-pay | Admitting: Oncology

## 2019-12-29 NOTE — Progress Notes (Signed)
Patient pre screened for office appointment, no questions or concerns today. Patient reminded of upcoming appointment time and date. 

## 2019-12-30 ENCOUNTER — Inpatient Hospital Stay: Payer: BC Managed Care – PPO | Attending: Oncology | Admitting: Oncology

## 2019-12-30 ENCOUNTER — Other Ambulatory Visit: Payer: Self-pay

## 2019-12-30 VITALS — BP 126/79 | HR 84 | Temp 97.1°F | Resp 18 | Wt 226.3 lb

## 2019-12-30 DIAGNOSIS — Z7984 Long term (current) use of oral hypoglycemic drugs: Secondary | ICD-10-CM | POA: Insufficient documentation

## 2019-12-30 DIAGNOSIS — Z90710 Acquired absence of both cervix and uterus: Secondary | ICD-10-CM | POA: Insufficient documentation

## 2019-12-30 DIAGNOSIS — Z170 Estrogen receptor positive status [ER+]: Secondary | ICD-10-CM | POA: Insufficient documentation

## 2019-12-30 DIAGNOSIS — D0511 Intraductal carcinoma in situ of right breast: Secondary | ICD-10-CM | POA: Diagnosis not present

## 2019-12-30 DIAGNOSIS — J45909 Unspecified asthma, uncomplicated: Secondary | ICD-10-CM | POA: Diagnosis not present

## 2019-12-30 DIAGNOSIS — Z8049 Family history of malignant neoplasm of other genital organs: Secondary | ICD-10-CM | POA: Insufficient documentation

## 2019-12-30 DIAGNOSIS — Z803 Family history of malignant neoplasm of breast: Secondary | ICD-10-CM | POA: Diagnosis not present

## 2019-12-30 DIAGNOSIS — Z801 Family history of malignant neoplasm of trachea, bronchus and lung: Secondary | ICD-10-CM | POA: Insufficient documentation

## 2019-12-30 DIAGNOSIS — Z7951 Long term (current) use of inhaled steroids: Secondary | ICD-10-CM | POA: Insufficient documentation

## 2019-12-30 DIAGNOSIS — Z800 Family history of malignant neoplasm of digestive organs: Secondary | ICD-10-CM | POA: Diagnosis not present

## 2019-12-30 DIAGNOSIS — Z79899 Other long term (current) drug therapy: Secondary | ICD-10-CM | POA: Diagnosis not present

## 2019-12-30 DIAGNOSIS — E119 Type 2 diabetes mellitus without complications: Secondary | ICD-10-CM | POA: Insufficient documentation

## 2019-12-30 DIAGNOSIS — Z8349 Family history of other endocrine, nutritional and metabolic diseases: Secondary | ICD-10-CM | POA: Insufficient documentation

## 2019-12-30 DIAGNOSIS — Z8249 Family history of ischemic heart disease and other diseases of the circulatory system: Secondary | ICD-10-CM | POA: Diagnosis not present

## 2020-01-01 IMAGING — MG DIGITAL SCREENING BILAT W/ TOMO W/ CAD
8 series · 8 of 24 positions shown · non-contrast
Comparison: Previous exam(s).

CLINICAL DATA: Screening.

EXAM:
DIGITAL SCREENING BILATERAL MAMMOGRAM WITH TOMO AND CAD

[L MLO synth-2D]
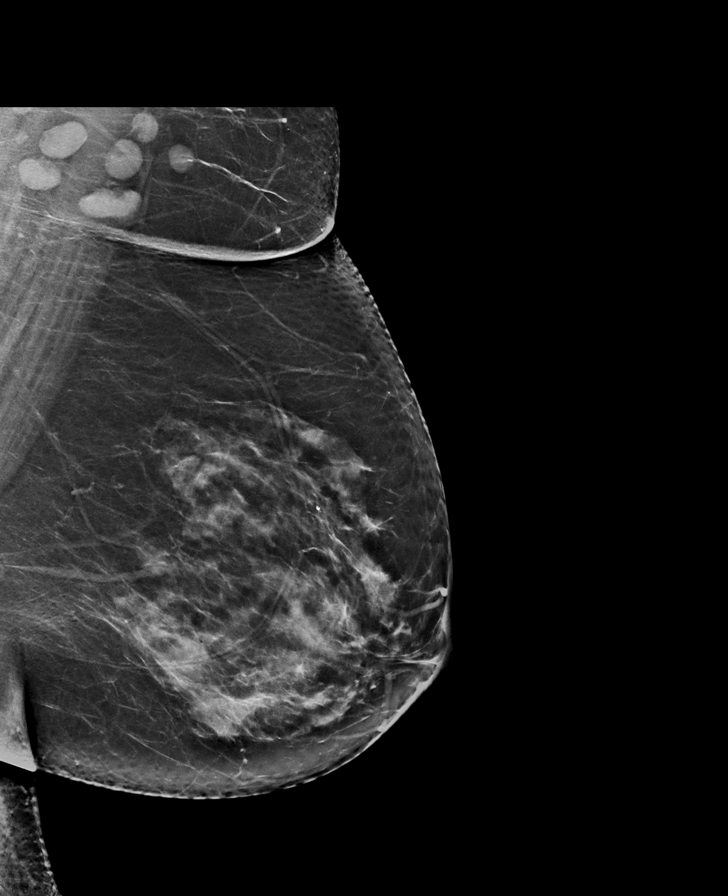

[R MLO synth-2D]
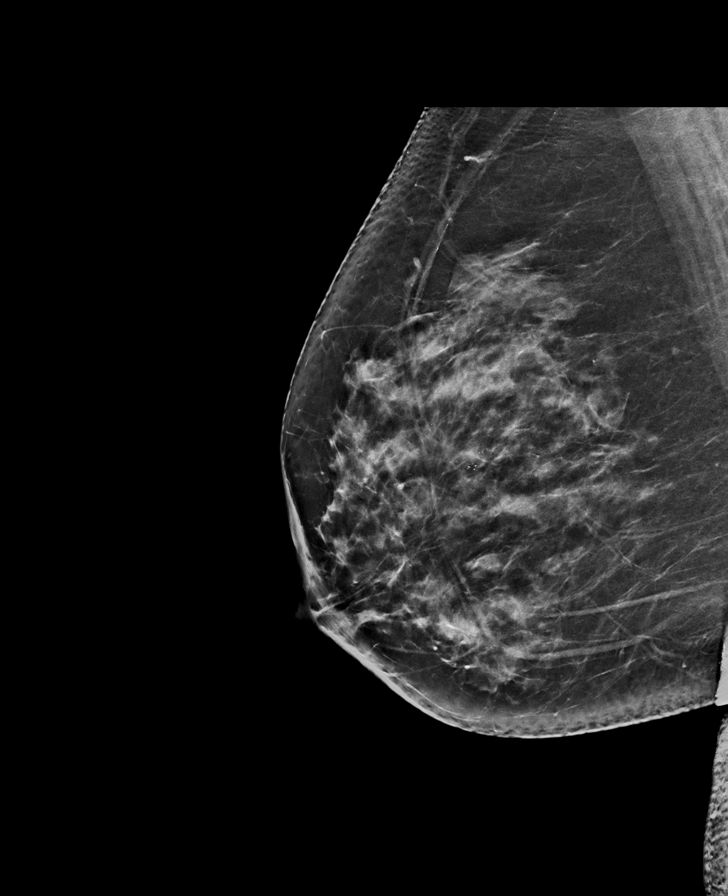

[L CC synth-2D]
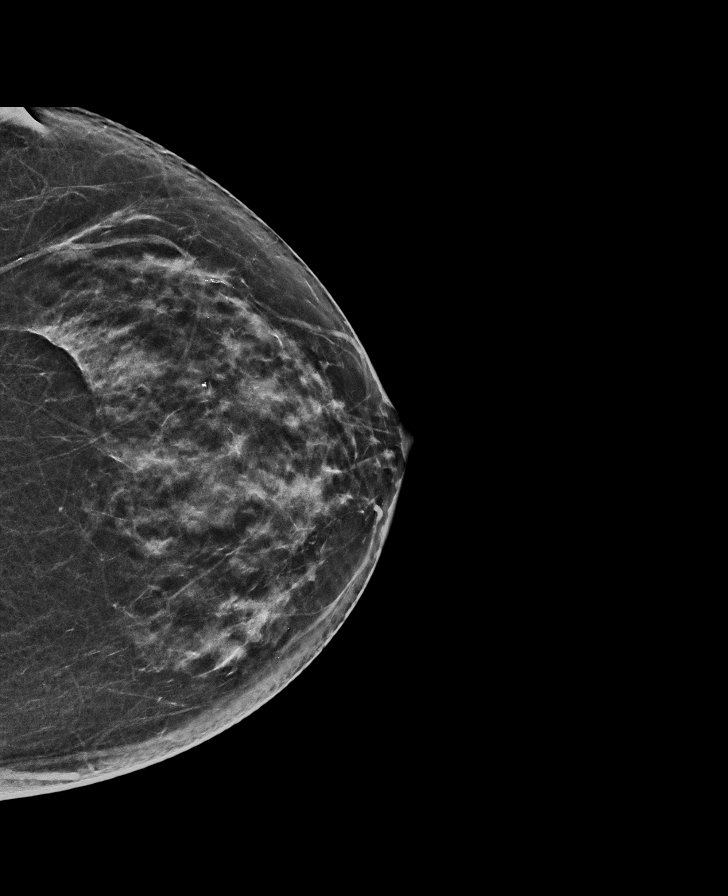

[R CC synth-2D]
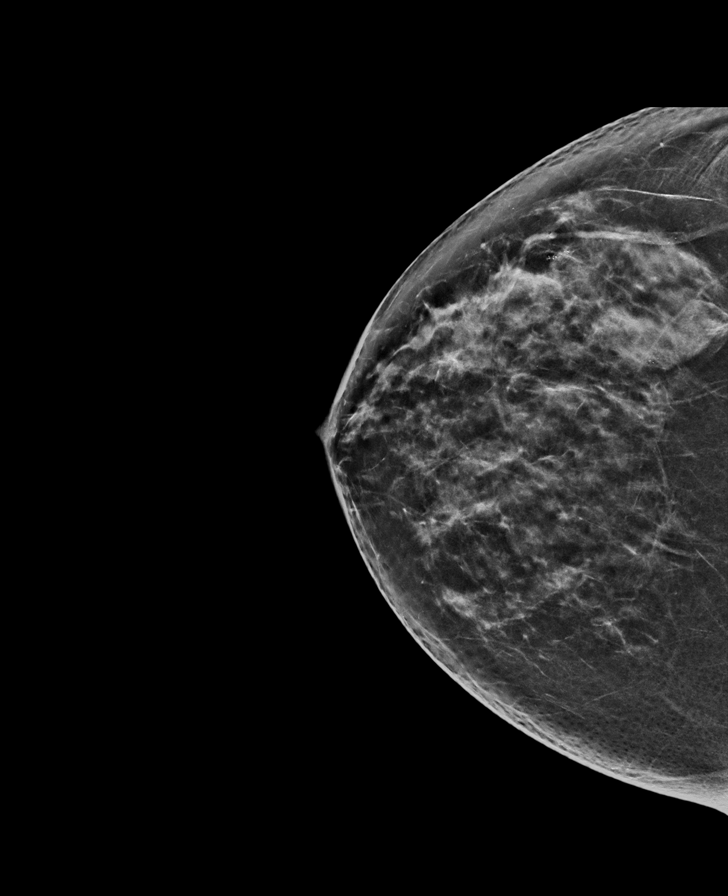

[R CC tomo · tomo slice 37/72.0]
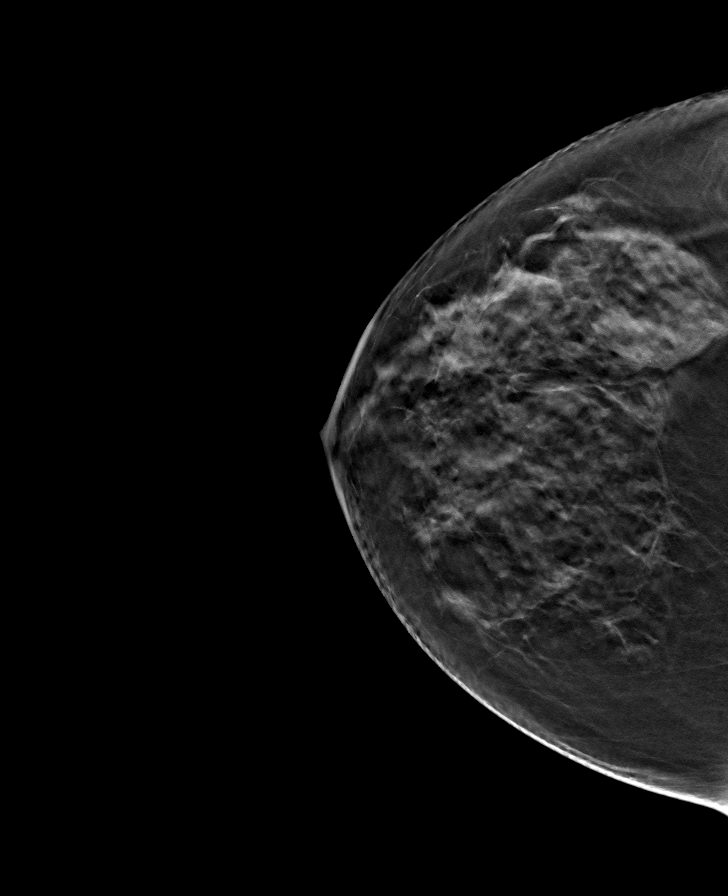

[R MLO tomo · tomo slice 40/79.0]
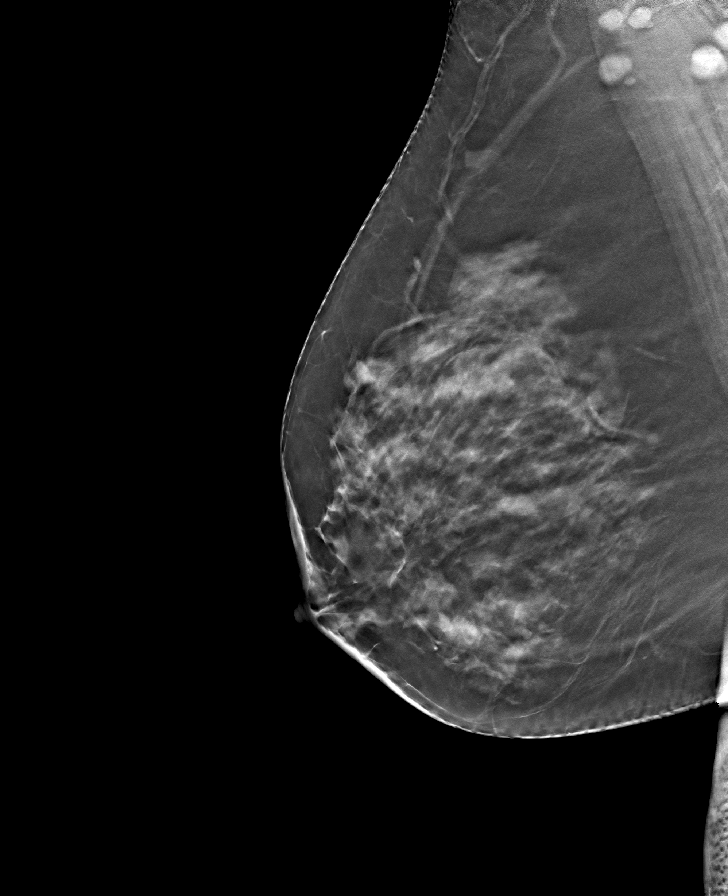

[L CC tomo · tomo slice 34/67.0]
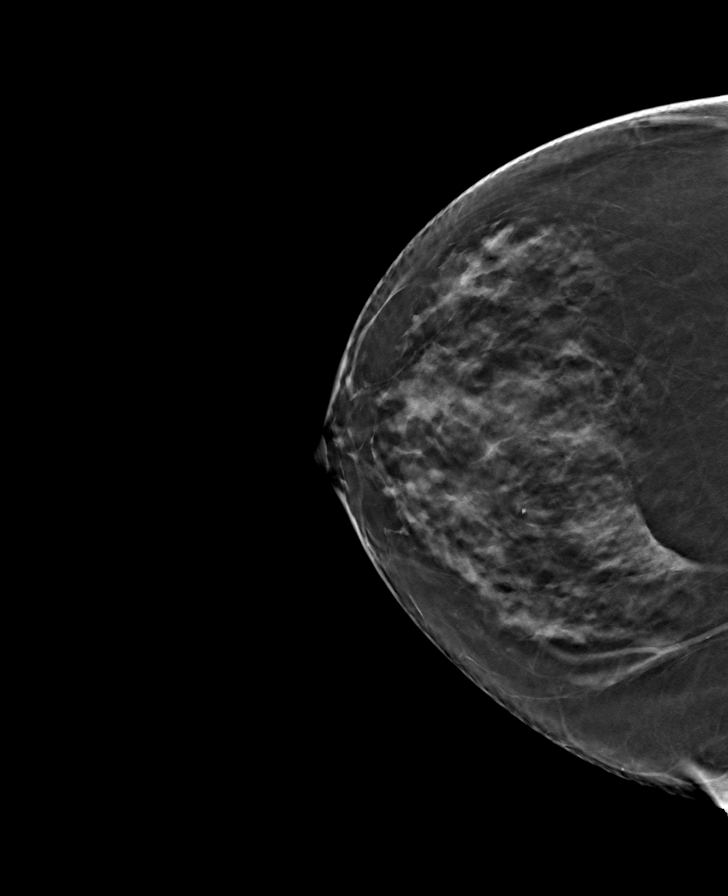

[L MLO tomo · tomo slice 43/86.0]
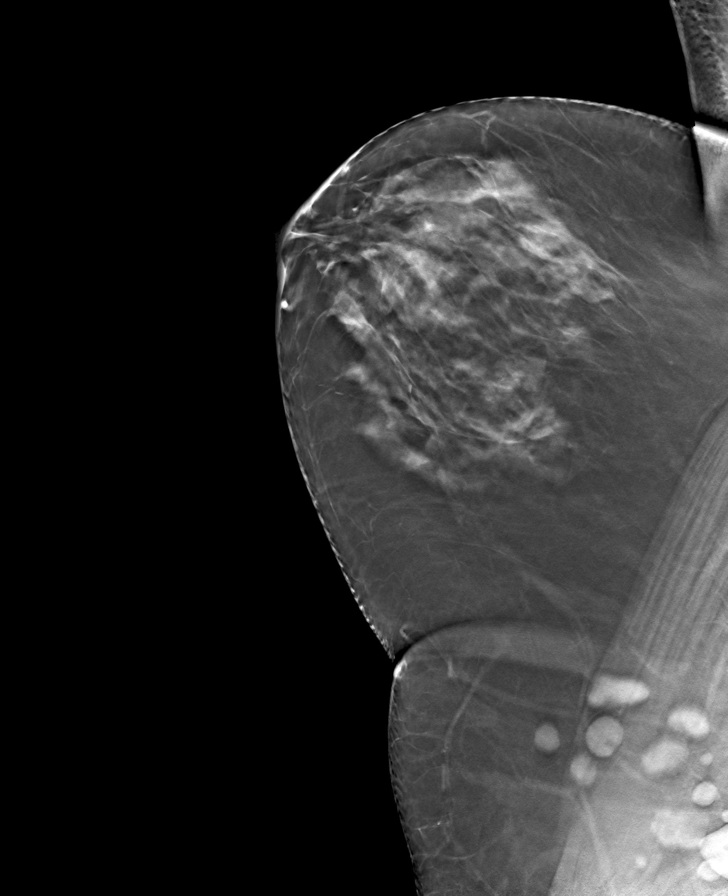

[8 of 24 positions shown; findings below may reference images not displayed]

ACR Breast Density Category c: The breast tissue is heterogeneously
dense, which may obscure small masses.
FINDINGS: In the right breast, calcifications warrant further evaluation with
magnified views. In the left breast, no findings suspicious for
malignancy. Images were processed with CAD.
IMPRESSION: Further evaluation is suggested for calcifications in the right
breast.

RECOMMENDATION:
Diagnostic mammogram of the right breast. (Code:[id number])

The patient will be contacted regarding the findings, and additional
imaging will be scheduled.

BI-RADS CATEGORY  0: Incomplete. Need additional imaging evaluation
and/or prior mammograms for comparison.

## 2020-01-10 ENCOUNTER — Other Ambulatory Visit: Payer: Self-pay

## 2020-01-11 ENCOUNTER — Ambulatory Visit
Admission: RE | Admit: 2020-01-11 | Discharge: 2020-01-11 | Disposition: A | Discharge status: Home / Self Care | Payer: BC Managed Care – PPO | Source: Ambulatory Visit | Attending: Radiation Oncology | Admitting: Radiation Oncology

## 2020-01-11 VITALS — BP 129/82 | HR 97 | Temp 97.9°F | Resp 16 | Wt 227.7 lb

## 2020-01-11 DIAGNOSIS — D649 Anemia, unspecified: Secondary | ICD-10-CM | POA: Insufficient documentation

## 2020-01-11 DIAGNOSIS — M5030 Other cervical disc degeneration, unspecified cervical region: Secondary | ICD-10-CM | POA: Insufficient documentation

## 2020-01-11 DIAGNOSIS — Z7984 Long term (current) use of oral hypoglycemic drugs: Secondary | ICD-10-CM | POA: Insufficient documentation

## 2020-01-11 DIAGNOSIS — Z800 Family history of malignant neoplasm of digestive organs: Secondary | ICD-10-CM | POA: Diagnosis not present

## 2020-01-11 DIAGNOSIS — K219 Gastro-esophageal reflux disease without esophagitis: Secondary | ICD-10-CM | POA: Insufficient documentation

## 2020-01-11 DIAGNOSIS — K589 Irritable bowel syndrome without diarrhea: Secondary | ICD-10-CM | POA: Insufficient documentation

## 2020-01-11 DIAGNOSIS — D0511 Intraductal carcinoma in situ of right breast: Secondary | ICD-10-CM | POA: Diagnosis not present

## 2020-01-11 DIAGNOSIS — M129 Arthropathy, unspecified: Secondary | ICD-10-CM | POA: Insufficient documentation

## 2020-01-11 DIAGNOSIS — Z87442 Personal history of urinary calculi: Secondary | ICD-10-CM | POA: Insufficient documentation

## 2020-01-11 DIAGNOSIS — Z79899 Other long term (current) drug therapy: Secondary | ICD-10-CM | POA: Insufficient documentation

## 2020-01-11 DIAGNOSIS — Z170 Estrogen receptor positive status [ER+]: Secondary | ICD-10-CM | POA: Insufficient documentation

## 2020-01-11 DIAGNOSIS — G4730 Sleep apnea, unspecified: Secondary | ICD-10-CM | POA: Insufficient documentation

## 2020-01-11 NOTE — Consult Note (Signed)
NEW PATIENT EVALUATION  Name: Diane Cox  MRN: [id number]  Date:   01/11/2020     DOB: [birth year]   This 59 y.o. 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  REFERRING PHYSICIAN: [name], MD  CHIEF COMPLAINT:  Chief Complaint  Patient presents with  . Breast Cancer    DIAGNOSIS: The encounter diagnosis was Ductal carcinoma in situ (DCIS) of right breast.   PREVIOUS INVESTIGATIONS:  Mammogram ultrasound reviewed Pathology report reviewed Clinical notes reviewed  HPI: Patient is a 59 year old female who presented with an abnormal mammogram of her right breast showing a 9 x 3 mm group of pleomorphic calcifications in the upper aspect of the right breast middle third at 9 o'clock position.  She underwent targeted ultrasound-guided biopsy which was positive for ductal carcinoma in situ ER positive.  She underwent a wide local excision for 4 mm area of ductal carcinoma in situ nuclear grade 2 with margins clear at 4 mm.  Tumor again was ER positive.  She tolerated her wide local excision well and is doing well postoperatively without complaint.  She specifically denies breast tenderness cough or bone pain.  She has been seen by medical oncology and is been recommended for antiestrogen therapy after completion of radiation.  She is were referred to radiation ecology for opinion today.  PLANNED TREATMENT REGIMEN: Right whole breast radiation  PAST MEDICAL HISTORY:  has a past medical history of Anemia, Arthritis, Asthma, Cancer ([place]), Complication of anesthesia, DDD (degenerative disc disease), cervical, Diabetes mellitus without complication ([place]), Elevated lipids, Family history of breast cancer, Family history of colon cancer, Family history of uterine cancer, Fatty liver, GERD (gastroesophageal reflux disease), History of kidney stones, IBS (irritable  bowel syndrome), IBS (irritable bowel syndrome), Migraines, Obesity, PONV (postoperative nausea and vomiting), Seasonal allergies, and Sleep apnea.    PAST SURGICAL HISTORY:  Past Surgical History:  Procedure Laterality Date  . ABDOMINAL HYSTERECTOMY    . BREAST BIOPSY Right 11/17/2019   affirm bx, coil marker, DUCTAL CARCINOMA IN SITU, INTERMEDIATE GRADE  . CHOLECYSTECTOMY    . COLONOSCOPY WITH PROPOFOL N/A 11/25/2016   Procedure: COLONOSCOPY WITH PROPOFOL;  Surgeon: Lollie Sails, MD;  Location: [facility] ENDOSCOPY;  Service: Endoscopy;  Laterality: N/A;  . ESOPHAGOGASTRODUODENOSCOPY (EGD) WITH PROPOFOL N/A 11/25/2016   Procedure: ESOPHAGOGASTRODUODENOSCOPY (EGD) WITH PROPOFOL;  Surgeon: Lollie Sails, MD;  Location: [facility] ENDOSCOPY;  Service: Endoscopy;  Laterality: N/A;  . EXCISION OF ENDOMETRIOMA    . PARTIAL MASTECTOMY WITH NEEDLE LOCALIZATION Right 12/19/2019   Procedure: PARTIAL MASTECTOMY WITH NEEDLE LOCALIZATION;  Surgeon: Herbert Pun, MD;  Location: [facility] ORS;  Service: General;  Laterality: Right;  . TONSILLECTOMY      FAMILY HISTORY: family history includes Bone cancer in her brother; Breast cancer in her maternal aunt; Colon cancer in her cousin and maternal aunt; Heart attack in her father; Stomach cancer in her maternal aunt; Throat cancer in her maternal aunt; Uterine cancer in her maternal grandmother, mother, and sister.  SOCIAL HISTORY:  reports that she has never smoked. She has never used smokeless tobacco. She reports current alcohol use. She reports that she does not use drugs.  ALLERGIES: Chicken allergy, Compazine [prochlorperazine edisylate], Other, and Pork-derived products  MEDICATIONS:  Current Outpatient Medications  Medication Sig Dispense Refill  . albuterol (PROVENTIL HFA;VENTOLIN HFA) 108 (90 BASE) MCG/ACT inhaler Inhale 4-6 puffs by  mouth every 4 hours as needed for wheezing, cough, and/or shortness of breath (Patient taking differently: Inhale  1-2 puffs into the lungs every 4 (four) hours as needed (wheeze, cough and/or shortness of breath). ) 1 Inhaler 1  . atorvastatin (LIPITOR) 20 MG tablet Take 20 mg by mouth at bedtime.     . benzonatate (TESSALON) 100 MG capsule Take 100 mg by mouth 3 (three) times daily as needed for cough.     Marland Kitchen BREO ELLIPTA 100-25 MCG/INH AEPB Inhale 1 puff into the lungs daily as needed (respiratory issues.).     Marland Kitchen carisoprodol (SOMA) 350 MG tablet Take 350 mg by mouth daily as needed (severe neck pain/stiffiness).    . cetirizine (ZYRTEC) 10 MG tablet Take 10 mg by mouth every evening.    . Cholecalciferol (VITAMIN D-3) 125 MCG (5000 UT) TABS Take 5,000 Units by mouth at bedtime.    . dicyclomine (BENTYL) 10 MG capsule Take 10 mg by mouth 3 (three) times daily as needed for spasms (IBS).     Marland Kitchen escitalopram (LEXAPRO) 10 MG tablet Take 10 mg by mouth every morning.     . fluticasone (FLONASE) 50 MCG/ACT nasal spray Place 2 sprays into the nose daily as needed (allergies.).     Marland Kitchen gabapentin (NEURONTIN) 100 MG capsule Take 200 mg by mouth 2 (two) times daily as needed (arthritis pain.).     Marland Kitchen glipiZIDE (GLUCOTROL XL) 10 MG 24 hr tablet Take 10 tablets by mouth 2 (two) times daily.    . hydrochlorothiazide (MICROZIDE) 12.5 MG capsule Take 12.5 mg by mouth daily.     Marland Kitchen HYDROcodone-acetaminophen (NORCO/VICODIN) 5-325 MG tablet Take 1 tablet by mouth every 4 (four) hours as needed.    . hyoscyamine (LEVSIN) 0.125 MG tablet Take 0.125 mg by mouth every 6 (six) hours as needed. PT HAS NOT PICKED UP RX AS OF 12-14-19    . Magnesium Oxide (MAG-200 PO) Take 200 mg by mouth at bedtime.    . Methylcellulose, Laxative, (CITRUCEL) 500 MG TABS Take 500 mg by mouth daily.    . montelukast (SINGULAIR) 10 MG tablet Take 10 mg by mouth every morning.     . pantoprazole (PROTONIX) 40 MG tablet Take 40 mg by mouth every morning.     . Potassium 99 MG TABS Take 99 mg by mouth daily.     . Probiotic Product (DIGESTIVE ADVANTAGE  GUMMIES PO) Take 2 tablets by mouth every evening.    . sucralfate (CARAFATE) 1 G tablet Take 1 g by mouth 2 (two) times daily.     Marland Kitchen topiramate (TOPAMAX) 25 MG tablet Take 25 mg by mouth 2 (two) times daily.     No current facility-administered medications for this encounter.    ECOG PERFORMANCE STATUS:  0 - Asymptomatic  REVIEW OF SYSTEMS: Patient denies any weight loss, fatigue, weakness, fever, chills or night sweats. Patient denies any loss of vision, blurred vision. Patient denies any ringing  of the ears or hearing loss. No irregular heartbeat. Patient denies heart murmur or history of fainting. Patient denies any chest pain or pain radiating to her upper extremities. Patient denies any shortness of breath, difficulty breathing at night, cough or hemoptysis. Patient denies any swelling in the lower legs. Patient denies any nausea vomiting, vomiting of blood, or coffee ground material in the vomitus. Patient denies any stomach pain. Patient states has had normal bowel movements no significant constipation or diarrhea. Patient denies any dysuria, hematuria or significant nocturia. Patient  denies any problems walking, swelling in the joints or loss of balance. Patient denies any skin changes, loss of hair or loss of weight. Patient denies any excessive worrying or anxiety or significant depression. Patient denies any problems with insomnia. Patient denies excessive thirst, polyuria, polydipsia. Patient denies any swollen glands, patient denies easy bruising or easy bleeding. Patient denies any recent infections, allergies or URI. Patient "s visual fields have not changed significantly in recent time.   PHYSICAL EXAM: BP 129/82   Pulse 97   Temp 97.9 F (36.6 C)   Resp 16   Wt 227 lb 11.2 oz (103.3 kg)   SpO2 96%   BMI 43.02 kg/m  She status post wide local excision of the right breast with incision healing well.  She has large breasts which would probably unsuitable for hypofractionated  course of treatment no dominant mass or nodularity is noted in either breast in 2 positions examined.  No axillary or supraclavicular adenopathy is identified.  Well-developed well-nourished patient in NAD. HEENT reveals PERLA, EOMI, discs not visualized.  Oral cavity is clear. No oral mucosal lesions are identified. Neck is clear without evidence of cervical or supraclavicular adenopathy. Lungs are clear to A&P. Cardiac examination is essentially unremarkable with regular rate and rhythm without murmur rub or thrill. Abdomen is benign with no organomegaly or masses noted. Motor sensory and DTR levels are equal and symmetric in the upper and lower extremities. Cranial nerves II through XII are grossly intact. Proprioception is intact. No peripheral adenopathy or edema is identified. No motor or sensory levels are noted. Crude visual fields are within normal range.  LABORATORY DATA: Pathology report reviewed compatible with above-stated findings    RADIOLOGY RESULTS: Mammogram and ultrasound reviewed   IMPRESSION: Ductal carcinoma in situ ER positive the right breast status post wide local excision in 59 year old female  PLAN: Present time her breasts are large which would make hypofractionated course of treatment difficult based on skin tolerance.  I have recommended whole breast radiation to 5040 cGy in 28 fractions.  Also would boost her scar another 1400 cGy based on the close margin.  Risks and benefits of treatment including skin reaction fatigue alteration of blood counts possible occlusion of superficial lung all were described in detail to the patient.  She seems to comprehend my treatment plan well.  I have personally set up and ordered CT simulation later this week.  Patient also will be a candidate for antiestrogen therapy after completion of radiation.  Patient comprehends my treatment plan well.  I would like to take this opportunity to thank you for allowing me to participate in the care of  your patient.Noreene Filbert, MD

## 2020-01-12 ENCOUNTER — Ambulatory Visit
Admission: RE | Admit: 2020-01-12 | Discharge: 2020-01-12 | Disposition: A | Discharge status: Home / Self Care | Payer: BC Managed Care – PPO | Source: Ambulatory Visit | Attending: Radiation Oncology | Admitting: Radiation Oncology

## 2020-01-12 DIAGNOSIS — Z170 Estrogen receptor positive status [ER+]: Secondary | ICD-10-CM | POA: Diagnosis not present

## 2020-01-12 DIAGNOSIS — Z510 Encounter for antineoplastic radiation therapy: Secondary | ICD-10-CM | POA: Diagnosis present

## 2020-01-12 DIAGNOSIS — D0511 Intraductal carcinoma in situ of right breast: Secondary | ICD-10-CM | POA: Insufficient documentation

## 2020-01-17 ENCOUNTER — Encounter: Payer: Self-pay | Admitting: Oncology

## 2020-01-17 DIAGNOSIS — D0511 Intraductal carcinoma in situ of right breast: Secondary | ICD-10-CM | POA: Diagnosis not present

## 2020-01-19 ENCOUNTER — Ambulatory Visit: Admission: RE | Admit: 2020-01-19 | Payer: BC Managed Care – PPO | Source: Ambulatory Visit

## 2020-01-19 DIAGNOSIS — D0511 Intraductal carcinoma in situ of right breast: Secondary | ICD-10-CM | POA: Diagnosis not present

## 2020-01-20 ENCOUNTER — Other Ambulatory Visit: Payer: Self-pay | Admitting: *Deleted

## 2020-01-20 DIAGNOSIS — D0511 Intraductal carcinoma in situ of right breast: Secondary | ICD-10-CM

## 2020-01-23 ENCOUNTER — Ambulatory Visit
Admission: RE | Admit: 2020-01-23 | Discharge: 2020-01-23 | Disposition: A | Discharge status: Home / Self Care | Payer: BC Managed Care – PPO | Source: Ambulatory Visit | Attending: Radiation Oncology | Admitting: Radiation Oncology

## 2020-01-23 DIAGNOSIS — D0511 Intraductal carcinoma in situ of right breast: Secondary | ICD-10-CM | POA: Diagnosis not present

## 2020-01-24 ENCOUNTER — Ambulatory Visit
Admission: RE | Admit: 2020-01-24 | Discharge: 2020-01-24 | Disposition: A | Discharge status: Home / Self Care | Payer: BC Managed Care – PPO | Source: Ambulatory Visit | Attending: Radiation Oncology | Admitting: Radiation Oncology

## 2020-01-24 DIAGNOSIS — D0511 Intraductal carcinoma in situ of right breast: Secondary | ICD-10-CM | POA: Diagnosis not present

## 2020-01-25 ENCOUNTER — Ambulatory Visit
Admission: RE | Admit: 2020-01-25 | Discharge: 2020-01-25 | Disposition: A | Discharge status: Home / Self Care | Payer: BC Managed Care – PPO | Source: Ambulatory Visit | Attending: Radiation Oncology | Admitting: Radiation Oncology

## 2020-01-25 DIAGNOSIS — D0511 Intraductal carcinoma in situ of right breast: Secondary | ICD-10-CM | POA: Diagnosis not present

## 2020-01-26 ENCOUNTER — Ambulatory Visit
Admission: RE | Admit: 2020-01-26 | Discharge: 2020-01-26 | Disposition: A | Discharge status: Home / Self Care | Payer: BC Managed Care – PPO | Source: Ambulatory Visit | Attending: Radiation Oncology | Admitting: Radiation Oncology

## 2020-01-26 DIAGNOSIS — D0511 Intraductal carcinoma in situ of right breast: Secondary | ICD-10-CM | POA: Diagnosis not present

## 2020-01-27 ENCOUNTER — Ambulatory Visit
Admission: RE | Admit: 2020-01-27 | Discharge: 2020-01-27 | Disposition: A | Discharge status: Home / Self Care | Payer: BC Managed Care – PPO | Source: Ambulatory Visit | Attending: Radiation Oncology | Admitting: Radiation Oncology

## 2020-01-27 DIAGNOSIS — D0511 Intraductal carcinoma in situ of right breast: Secondary | ICD-10-CM | POA: Diagnosis not present

## 2020-01-30 ENCOUNTER — Ambulatory Visit
Admission: RE | Admit: 2020-01-30 | Discharge: 2020-01-30 | Disposition: A | Discharge status: Home / Self Care | Payer: BC Managed Care – PPO | Source: Ambulatory Visit | Attending: Radiation Oncology | Admitting: Radiation Oncology

## 2020-01-30 DIAGNOSIS — D0511 Intraductal carcinoma in situ of right breast: Secondary | ICD-10-CM | POA: Diagnosis not present

## 2020-01-31 ENCOUNTER — Ambulatory Visit
Admission: RE | Admit: 2020-01-31 | Discharge: 2020-01-31 | Disposition: A | Discharge status: Home / Self Care | Payer: BC Managed Care – PPO | Source: Ambulatory Visit | Attending: Radiation Oncology | Admitting: Radiation Oncology

## 2020-01-31 DIAGNOSIS — D0511 Intraductal carcinoma in situ of right breast: Secondary | ICD-10-CM | POA: Diagnosis not present

## 2020-02-01 ENCOUNTER — Ambulatory Visit
Admission: RE | Admit: 2020-02-01 | Discharge: 2020-02-01 | Disposition: A | Discharge status: Home / Self Care | Payer: BC Managed Care – PPO | Source: Ambulatory Visit | Attending: Radiation Oncology | Admitting: Radiation Oncology

## 2020-02-01 DIAGNOSIS — D0511 Intraductal carcinoma in situ of right breast: Secondary | ICD-10-CM | POA: Diagnosis not present

## 2020-02-02 ENCOUNTER — Ambulatory Visit
Admission: RE | Admit: 2020-02-02 | Discharge: 2020-02-02 | Disposition: A | Discharge status: Home / Self Care | Payer: BC Managed Care – PPO | Source: Ambulatory Visit | Attending: Radiation Oncology | Admitting: Radiation Oncology

## 2020-02-02 DIAGNOSIS — Z170 Estrogen receptor positive status [ER+]: Secondary | ICD-10-CM | POA: Insufficient documentation

## 2020-02-02 DIAGNOSIS — D0511 Intraductal carcinoma in situ of right breast: Secondary | ICD-10-CM | POA: Insufficient documentation

## 2020-02-02 DIAGNOSIS — Z510 Encounter for antineoplastic radiation therapy: Secondary | ICD-10-CM | POA: Insufficient documentation

## 2020-02-03 ENCOUNTER — Ambulatory Visit
Admission: RE | Admit: 2020-02-03 | Discharge: 2020-02-03 | Disposition: A | Discharge status: Home / Self Care | Payer: BC Managed Care – PPO | Source: Ambulatory Visit | Attending: Radiation Oncology | Admitting: Radiation Oncology

## 2020-02-03 DIAGNOSIS — D0511 Intraductal carcinoma in situ of right breast: Secondary | ICD-10-CM | POA: Diagnosis not present

## 2020-02-06 ENCOUNTER — Ambulatory Visit
Admission: RE | Admit: 2020-02-06 | Discharge: 2020-02-06 | Disposition: A | Discharge status: Home / Self Care | Payer: BC Managed Care – PPO | Source: Ambulatory Visit | Attending: Radiation Oncology | Admitting: Radiation Oncology

## 2020-02-06 DIAGNOSIS — D0511 Intraductal carcinoma in situ of right breast: Secondary | ICD-10-CM | POA: Diagnosis not present

## 2020-02-07 ENCOUNTER — Other Ambulatory Visit: Payer: Self-pay

## 2020-02-07 ENCOUNTER — Inpatient Hospital Stay: Payer: BC Managed Care – PPO | Attending: Radiation Oncology

## 2020-02-07 ENCOUNTER — Ambulatory Visit
Admission: RE | Admit: 2020-02-07 | Discharge: 2020-02-07 | Disposition: A | Discharge status: Home / Self Care | Payer: BC Managed Care – PPO | Source: Ambulatory Visit | Attending: Radiation Oncology | Admitting: Radiation Oncology

## 2020-02-07 DIAGNOSIS — Z170 Estrogen receptor positive status [ER+]: Secondary | ICD-10-CM | POA: Insufficient documentation

## 2020-02-07 DIAGNOSIS — D0511 Intraductal carcinoma in situ of right breast: Secondary | ICD-10-CM | POA: Insufficient documentation

## 2020-02-07 LAB — CBC
HCT: 39.7 % (ref 36.0–46.0)
Hemoglobin: 12.8 g/dL (ref 12.0–15.0)
MCH: 29.6 pg (ref 26.0–34.0)
MCHC: 32.2 g/dL (ref 30.0–36.0)
MCV: 91.7 fL (ref 80.0–100.0)
Platelets: 353 10*3/uL (ref 150–400)
RBC: 4.33 MIL/uL (ref 3.87–5.11)
RDW: 11.9 % (ref 11.5–15.5)
WBC: 9.3 10*3/uL (ref 4.0–10.5)
nRBC: 0 % (ref 0.0–0.2)

## 2020-02-08 ENCOUNTER — Ambulatory Visit
Admission: RE | Admit: 2020-02-08 | Discharge: 2020-02-08 | Disposition: A | Discharge status: Home / Self Care | Payer: BC Managed Care – PPO | Source: Ambulatory Visit | Attending: Radiation Oncology | Admitting: Radiation Oncology

## 2020-02-08 DIAGNOSIS — D0511 Intraductal carcinoma in situ of right breast: Secondary | ICD-10-CM | POA: Diagnosis not present

## 2020-02-09 ENCOUNTER — Ambulatory Visit: Payer: BC Managed Care – PPO

## 2020-02-10 ENCOUNTER — Ambulatory Visit
Admission: RE | Admit: 2020-02-10 | Discharge: 2020-02-10 | Disposition: A | Discharge status: Home / Self Care | Payer: BC Managed Care – PPO | Source: Ambulatory Visit | Attending: Radiation Oncology | Admitting: Radiation Oncology

## 2020-02-10 DIAGNOSIS — D0511 Intraductal carcinoma in situ of right breast: Secondary | ICD-10-CM | POA: Diagnosis not present

## 2020-02-13 ENCOUNTER — Ambulatory Visit
Admission: RE | Admit: 2020-02-13 | Discharge: 2020-02-13 | Disposition: A | Discharge status: Home / Self Care | Payer: BC Managed Care – PPO | Source: Ambulatory Visit | Attending: Radiation Oncology | Admitting: Radiation Oncology

## 2020-02-13 ENCOUNTER — Encounter: Payer: Self-pay | Admitting: Oncology

## 2020-02-13 DIAGNOSIS — D0511 Intraductal carcinoma in situ of right breast: Secondary | ICD-10-CM | POA: Diagnosis not present

## 2020-02-14 ENCOUNTER — Ambulatory Visit
Admission: RE | Admit: 2020-02-14 | Discharge: 2020-02-14 | Disposition: A | Discharge status: Home / Self Care | Payer: BC Managed Care – PPO | Source: Ambulatory Visit | Attending: Radiation Oncology | Admitting: Radiation Oncology

## 2020-02-14 DIAGNOSIS — D0511 Intraductal carcinoma in situ of right breast: Secondary | ICD-10-CM | POA: Diagnosis not present

## 2020-02-15 ENCOUNTER — Ambulatory Visit
Admission: RE | Admit: 2020-02-15 | Discharge: 2020-02-15 | Disposition: A | Discharge status: Home / Self Care | Payer: BC Managed Care – PPO | Source: Ambulatory Visit | Attending: Radiation Oncology | Admitting: Radiation Oncology

## 2020-02-15 DIAGNOSIS — D0511 Intraductal carcinoma in situ of right breast: Secondary | ICD-10-CM | POA: Diagnosis not present

## 2020-02-16 ENCOUNTER — Ambulatory Visit
Admission: RE | Admit: 2020-02-16 | Discharge: 2020-02-16 | Disposition: A | Discharge status: Home / Self Care | Payer: BC Managed Care – PPO | Source: Ambulatory Visit | Attending: Radiation Oncology | Admitting: Radiation Oncology

## 2020-02-16 DIAGNOSIS — D0511 Intraductal carcinoma in situ of right breast: Secondary | ICD-10-CM | POA: Diagnosis not present

## 2020-02-17 ENCOUNTER — Ambulatory Visit
Admission: RE | Admit: 2020-02-17 | Discharge: 2020-02-17 | Disposition: A | Discharge status: Home / Self Care | Payer: BC Managed Care – PPO | Source: Ambulatory Visit | Attending: Radiation Oncology | Admitting: Radiation Oncology

## 2020-02-17 DIAGNOSIS — D0511 Intraductal carcinoma in situ of right breast: Secondary | ICD-10-CM | POA: Diagnosis not present

## 2020-02-20 ENCOUNTER — Ambulatory Visit
Admission: RE | Admit: 2020-02-20 | Discharge: 2020-02-20 | Disposition: A | Discharge status: Home / Self Care | Payer: BC Managed Care – PPO | Source: Ambulatory Visit | Attending: Radiation Oncology | Admitting: Radiation Oncology

## 2020-02-20 DIAGNOSIS — D0511 Intraductal carcinoma in situ of right breast: Secondary | ICD-10-CM | POA: Diagnosis not present

## 2020-02-21 ENCOUNTER — Inpatient Hospital Stay: Payer: BC Managed Care – PPO

## 2020-02-21 ENCOUNTER — Other Ambulatory Visit: Payer: Self-pay

## 2020-02-21 ENCOUNTER — Ambulatory Visit
Admission: RE | Admit: 2020-02-21 | Discharge: 2020-02-21 | Disposition: A | Discharge status: Home / Self Care | Payer: BC Managed Care – PPO | Source: Ambulatory Visit | Attending: Radiation Oncology | Admitting: Radiation Oncology

## 2020-02-21 DIAGNOSIS — D0511 Intraductal carcinoma in situ of right breast: Secondary | ICD-10-CM

## 2020-02-21 LAB — CBC
HCT: 38.7 % (ref 36.0–46.0)
Hemoglobin: 12.7 g/dL (ref 12.0–15.0)
MCH: 29.5 pg (ref 26.0–34.0)
MCHC: 32.8 g/dL (ref 30.0–36.0)
MCV: 90 fL (ref 80.0–100.0)
Platelets: 338 10*3/uL (ref 150–400)
RBC: 4.3 MIL/uL (ref 3.87–5.11)
RDW: 11.9 % (ref 11.5–15.5)
WBC: 8.4 10*3/uL (ref 4.0–10.5)
nRBC: 0 % (ref 0.0–0.2)

## 2020-02-22 ENCOUNTER — Ambulatory Visit: Payer: BC Managed Care – PPO

## 2020-02-22 DIAGNOSIS — D0511 Intraductal carcinoma in situ of right breast: Secondary | ICD-10-CM | POA: Diagnosis not present

## 2020-02-23 ENCOUNTER — Ambulatory Visit
Admission: RE | Admit: 2020-02-23 | Discharge: 2020-02-23 | Disposition: A | Discharge status: Home / Self Care | Payer: BC Managed Care – PPO | Source: Ambulatory Visit | Attending: Radiation Oncology | Admitting: Radiation Oncology

## 2020-02-23 DIAGNOSIS — D0511 Intraductal carcinoma in situ of right breast: Secondary | ICD-10-CM | POA: Diagnosis not present

## 2020-02-24 ENCOUNTER — Ambulatory Visit
Admission: RE | Admit: 2020-02-24 | Discharge: 2020-02-24 | Disposition: A | Discharge status: Home / Self Care | Payer: BC Managed Care – PPO | Source: Ambulatory Visit | Attending: Radiation Oncology | Admitting: Radiation Oncology

## 2020-02-24 DIAGNOSIS — Z510 Encounter for antineoplastic radiation therapy: Secondary | ICD-10-CM | POA: Diagnosis present

## 2020-02-24 DIAGNOSIS — Z170 Estrogen receptor positive status [ER+]: Secondary | ICD-10-CM | POA: Diagnosis not present

## 2020-02-24 DIAGNOSIS — D0511 Intraductal carcinoma in situ of right breast: Secondary | ICD-10-CM | POA: Insufficient documentation

## 2020-02-27 ENCOUNTER — Ambulatory Visit
Admission: RE | Admit: 2020-02-27 | Discharge: 2020-02-27 | Disposition: A | Discharge status: Home / Self Care | Payer: BC Managed Care – PPO | Source: Ambulatory Visit | Attending: Radiation Oncology | Admitting: Radiation Oncology

## 2020-02-27 DIAGNOSIS — D0511 Intraductal carcinoma in situ of right breast: Secondary | ICD-10-CM | POA: Diagnosis not present

## 2020-02-28 ENCOUNTER — Ambulatory Visit
Admission: RE | Admit: 2020-02-28 | Discharge: 2020-02-28 | Disposition: A | Discharge status: Home / Self Care | Payer: BC Managed Care – PPO | Source: Ambulatory Visit | Attending: Radiation Oncology | Admitting: Radiation Oncology

## 2020-02-28 DIAGNOSIS — D0511 Intraductal carcinoma in situ of right breast: Secondary | ICD-10-CM | POA: Diagnosis not present

## 2020-02-29 ENCOUNTER — Ambulatory Visit
Admission: RE | Admit: 2020-02-29 | Discharge: 2020-02-29 | Disposition: A | Discharge status: Home / Self Care | Payer: BC Managed Care – PPO | Source: Ambulatory Visit | Attending: Radiation Oncology | Admitting: Radiation Oncology

## 2020-02-29 DIAGNOSIS — D0511 Intraductal carcinoma in situ of right breast: Secondary | ICD-10-CM | POA: Diagnosis not present

## 2020-03-01 ENCOUNTER — Ambulatory Visit: Payer: BC Managed Care – PPO

## 2020-03-01 ENCOUNTER — Ambulatory Visit
Admission: RE | Admit: 2020-03-01 | Discharge: 2020-03-01 | Disposition: A | Discharge status: Home / Self Care | Payer: BC Managed Care – PPO | Source: Ambulatory Visit | Attending: Radiation Oncology | Admitting: Radiation Oncology

## 2020-03-01 DIAGNOSIS — D0511 Intraductal carcinoma in situ of right breast: Secondary | ICD-10-CM | POA: Diagnosis not present

## 2020-03-02 ENCOUNTER — Ambulatory Visit: Payer: BC Managed Care – PPO

## 2020-03-02 ENCOUNTER — Other Ambulatory Visit: Payer: Self-pay | Admitting: *Deleted

## 2020-03-02 ENCOUNTER — Ambulatory Visit
Admission: RE | Admit: 2020-03-02 | Discharge: 2020-03-02 | Disposition: A | Discharge status: Home / Self Care | Payer: BC Managed Care – PPO | Source: Ambulatory Visit | Attending: Radiation Oncology | Admitting: Radiation Oncology

## 2020-03-02 DIAGNOSIS — D0511 Intraductal carcinoma in situ of right breast: Secondary | ICD-10-CM | POA: Diagnosis not present

## 2020-03-02 MED ORDER — SILVER SULFADIAZINE 1 % EX CREA: 1.0000 "application " | TOPICAL_CREAM | Freq: Two times a day (BID) | CUTANEOUS | 2 refills | Status: AC

## 2020-03-05 ENCOUNTER — Ambulatory Visit: Payer: BC Managed Care – PPO | Admitting: Oncology

## 2020-03-05 ENCOUNTER — Ambulatory Visit
Admission: RE | Admit: 2020-03-05 | Discharge: 2020-03-05 | Disposition: A | Discharge status: Home / Self Care | Payer: BC Managed Care – PPO | Source: Ambulatory Visit | Attending: Radiation Oncology | Admitting: Radiation Oncology

## 2020-03-05 DIAGNOSIS — D0511 Intraductal carcinoma in situ of right breast: Secondary | ICD-10-CM | POA: Diagnosis not present

## 2020-03-05 DIAGNOSIS — Z510 Encounter for antineoplastic radiation therapy: Secondary | ICD-10-CM | POA: Diagnosis present

## 2020-03-05 DIAGNOSIS — Z170 Estrogen receptor positive status [ER+]: Secondary | ICD-10-CM | POA: Insufficient documentation

## 2020-03-06 ENCOUNTER — Ambulatory Visit
Admission: RE | Admit: 2020-03-06 | Discharge: 2020-03-06 | Disposition: A | Discharge status: Home / Self Care | Payer: BC Managed Care – PPO | Source: Ambulatory Visit | Attending: Radiation Oncology | Admitting: Radiation Oncology

## 2020-03-06 ENCOUNTER — Other Ambulatory Visit: Payer: Self-pay

## 2020-03-06 ENCOUNTER — Inpatient Hospital Stay: Payer: BC Managed Care – PPO | Attending: Radiation Oncology

## 2020-03-06 DIAGNOSIS — Z801 Family history of malignant neoplasm of trachea, bronchus and lung: Secondary | ICD-10-CM | POA: Insufficient documentation

## 2020-03-06 DIAGNOSIS — Z79899 Other long term (current) drug therapy: Secondary | ICD-10-CM | POA: Diagnosis not present

## 2020-03-06 DIAGNOSIS — Z8049 Family history of malignant neoplasm of other genital organs: Secondary | ICD-10-CM | POA: Diagnosis not present

## 2020-03-06 DIAGNOSIS — Z808 Family history of malignant neoplasm of other organs or systems: Secondary | ICD-10-CM | POA: Diagnosis not present

## 2020-03-06 DIAGNOSIS — Z7951 Long term (current) use of inhaled steroids: Secondary | ICD-10-CM | POA: Diagnosis not present

## 2020-03-06 DIAGNOSIS — D0511 Intraductal carcinoma in situ of right breast: Secondary | ICD-10-CM

## 2020-03-06 DIAGNOSIS — Z8249 Family history of ischemic heart disease and other diseases of the circulatory system: Secondary | ICD-10-CM | POA: Diagnosis not present

## 2020-03-06 DIAGNOSIS — Z803 Family history of malignant neoplasm of breast: Secondary | ICD-10-CM | POA: Diagnosis not present

## 2020-03-06 DIAGNOSIS — Z8349 Family history of other endocrine, nutritional and metabolic diseases: Secondary | ICD-10-CM | POA: Insufficient documentation

## 2020-03-06 DIAGNOSIS — Z170 Estrogen receptor positive status [ER+]: Secondary | ICD-10-CM | POA: Insufficient documentation

## 2020-03-06 DIAGNOSIS — Z7984 Long term (current) use of oral hypoglycemic drugs: Secondary | ICD-10-CM | POA: Diagnosis not present

## 2020-03-06 DIAGNOSIS — Z800 Family history of malignant neoplasm of digestive organs: Secondary | ICD-10-CM | POA: Diagnosis not present

## 2020-03-06 DIAGNOSIS — E119 Type 2 diabetes mellitus without complications: Secondary | ICD-10-CM | POA: Diagnosis not present

## 2020-03-06 DIAGNOSIS — Z79810 Long term (current) use of selective estrogen receptor modulators (SERMs): Secondary | ICD-10-CM | POA: Diagnosis not present

## 2020-03-06 DIAGNOSIS — Z923 Personal history of irradiation: Secondary | ICD-10-CM | POA: Diagnosis not present

## 2020-03-06 DIAGNOSIS — J45909 Unspecified asthma, uncomplicated: Secondary | ICD-10-CM | POA: Insufficient documentation

## 2020-03-06 LAB — CBC
HCT: 41.6 % (ref 36.0–46.0)
Hemoglobin: 13.5 g/dL (ref 12.0–15.0)
MCH: 29.3 pg (ref 26.0–34.0)
MCHC: 32.5 g/dL (ref 30.0–36.0)
MCV: 90.2 fL (ref 80.0–100.0)
Platelets: 338 10*3/uL (ref 150–400)
RBC: 4.61 MIL/uL (ref 3.87–5.11)
RDW: 12 % (ref 11.5–15.5)
WBC: 9.6 10*3/uL (ref 4.0–10.5)
nRBC: 0 % (ref 0.0–0.2)

## 2020-03-07 ENCOUNTER — Ambulatory Visit
Admission: RE | Admit: 2020-03-07 | Discharge: 2020-03-07 | Disposition: A | Discharge status: Home / Self Care | Payer: BC Managed Care – PPO | Source: Ambulatory Visit | Attending: Radiation Oncology | Admitting: Radiation Oncology

## 2020-03-07 DIAGNOSIS — D0511 Intraductal carcinoma in situ of right breast: Secondary | ICD-10-CM | POA: Diagnosis not present

## 2020-03-08 ENCOUNTER — Ambulatory Visit
Admission: RE | Admit: 2020-03-08 | Discharge: 2020-03-08 | Disposition: A | Discharge status: Home / Self Care | Payer: BC Managed Care – PPO | Source: Ambulatory Visit | Attending: Radiation Oncology | Admitting: Radiation Oncology

## 2020-03-08 DIAGNOSIS — D0511 Intraductal carcinoma in situ of right breast: Secondary | ICD-10-CM | POA: Diagnosis not present

## 2020-03-09 ENCOUNTER — Ambulatory Visit
Admission: RE | Admit: 2020-03-09 | Discharge: 2020-03-09 | Disposition: A | Discharge status: Home / Self Care | Payer: BC Managed Care – PPO | Source: Ambulatory Visit | Attending: Radiation Oncology | Admitting: Radiation Oncology

## 2020-03-09 DIAGNOSIS — D0511 Intraductal carcinoma in situ of right breast: Secondary | ICD-10-CM | POA: Diagnosis not present

## 2020-03-09 NOTE — Progress Notes (Signed)
[place]  Telephone:(336) [phone] Fax:(336) [phone]  ID: Terese Door OB: 14-Dec-1960  MR#: [id number]  AOZ#:[id number]  Patient Care Team: [name], MD as PCP - General (Family Medicine) Theodore Demark, RN as Registered Nurse (Oncology) Rico Junker, RN as Registered Nurse (Oncology) Lloyd Huger, MD as Consulting Physician (Oncology)  CHIEF COMPLAINT: Right breast DCIS  INTERVAL HISTORY: Patient returns to clinic today for further evaluation and initiation of tamoxifen.  She had some blistering and skin breakdown with XRT, but otherwise tolerated it well. She has no neurologic complaints.  She denies any recent fevers or illnesses.  She has a good appetite and denies weight loss.  She has no chest pain, shortness of breath, cough, or hemoptysis.  She denies any nausea, vomiting, constipation, or diarrhea.  She has no urinary complaints.  Patient offers no further specific complaints today.  REVIEW OF SYSTEMS:   Review of Systems  Constitutional: Negative.  Negative for fever, malaise/fatigue and weight loss.  Respiratory: Negative.  Negative for cough, hemoptysis and shortness of breath.   Cardiovascular: Negative.  Negative for chest pain and leg swelling.  Gastrointestinal: Negative.  Negative for abdominal pain.  Genitourinary: Negative.  Negative for dysuria.  Musculoskeletal: Negative.  Negative for back pain.  Skin: Negative.  Negative for rash.  Neurological: Negative.  Negative for dizziness, focal weakness, weakness and headaches.  Psychiatric/Behavioral: Negative.  The patient is not nervous/anxious.     As per HPI. Otherwise, a complete review of systems is negative.  PAST MEDICAL HISTORY: Past Medical History:  Diagnosis Date  . Anemia   . Arthritis   . Asthma    WELL CONTROLLED  . Cancer ([place])    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 11/25/19  . Complication of anesthesia   . DDD (degenerative disc disease), cervical      AND SPINE  . Diabetes mellitus without complication ([place])   . Elevated lipids   . Family history of breast cancer   . Family history of colon cancer   . Family history of uterine cancer   . Fatty liver    H/O  . GERD (gastroesophageal reflux disease)   . History of kidney stones    H/O  . IBS (irritable bowel syndrome)   . IBS (irritable bowel syndrome)   . Migraines   . Obesity   . 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 Seasonal allergies   . Sleep apnea    USES CPAP    PAST SURGICAL HISTORY: Past Surgical History:  Procedure Laterality Date  . ABDOMINAL HYSTERECTOMY    . BREAST BIOPSY Right 11/17/2019   affirm bx, coil marker, DUCTAL CARCINOMA IN SITU, INTERMEDIATE GRADE  . CHOLECYSTECTOMY    . COLONOSCOPY WITH PROPOFOL N/A 11/25/2016   Procedure: COLONOSCOPY WITH PROPOFOL;  Surgeon: Lollie Sails, MD;  Location: [facility] ENDOSCOPY;  Service: Endoscopy;  Laterality: N/A;  . ESOPHAGOGASTRODUODENOSCOPY (EGD) WITH PROPOFOL N/A 11/25/2016   Procedure: ESOPHAGOGASTRODUODENOSCOPY (EGD) WITH PROPOFOL;  Surgeon: Lollie Sails, MD;  Location: [facility] ENDOSCOPY;  Service: Endoscopy;  Laterality: N/A;  . EXCISION OF ENDOMETRIOMA    . PARTIAL MASTECTOMY WITH NEEDLE LOCALIZATION Right 12/19/2019   Procedure: PARTIAL MASTECTOMY WITH NEEDLE LOCALIZATION;  Surgeon: Herbert Pun, MD;  Location: [facility] ORS;  Service: General;  Laterality: Right;  . TONSILLECTOMY      FAMILY HISTORY: Family History  Problem Relation Age of Onset  . Heart attack Father   .  Uterine cancer Mother        dx 19s  . Breast cancer Maternal Aunt        dx 3s, recurrence in 3s  . Uterine cancer Maternal Grandmother        dx 50s-60s  . Uterine cancer Sister        dx 18s  . Bone cancer Brother        dx 82s  . Colon cancer Maternal Aunt   . Stomach cancer Maternal Aunt   . Throat cancer Maternal Aunt   . Colon cancer Cousin     ADVANCED DIRECTIVES (Y/N):   N  HEALTH MAINTENANCE: Social History   Tobacco Use  . Smoking status: Never Smoker  . Smokeless tobacco: Never Used  Substance Use Topics  . Alcohol use: Yes    Alcohol/week: 0.0 standard drinks    Comment: occasional  . Drug use: No     Colonoscopy:  PAP:  Bone density:  Lipid panel:  Allergies  Allergen Reactions  . Chicken Allergy Rash  . Compazine [Prochlorperazine Edisylate] Nausea And Vomiting and Anxiety  . Other Rash    BLOOD THINNERS-THE COMPONENT IN BLOOD THINNERS CAUSE A RASH AT INJECTION SITE BLOOD THINNERS-THE COMPONENT IN BLOOD THINNERS CAUSE A RASH AT INJECTION SITE  . Pork-Derived Products Rash    Current Outpatient Medications  Medication Sig Dispense Refill  . albuterol (PROVENTIL HFA;VENTOLIN HFA) 108 (90 BASE) MCG/ACT inhaler Inhale 4-6 puffs by mouth every 4 hours as needed for wheezing, cough, and/or shortness of breath (Patient taking differently: Inhale 1-2 puffs into the lungs every 4 (four) hours as needed (wheeze, cough and/or shortness of breath). ) 1 Inhaler 1  . atorvastatin (LIPITOR) 20 MG tablet Take 20 mg by mouth at bedtime.     . benzonatate (TESSALON) 100 MG capsule Take 100 mg by mouth 3 (three) times daily as needed for cough.     Marland Kitchen BREO ELLIPTA 100-25 MCG/INH AEPB Inhale 1 puff into the lungs daily as needed (respiratory issues.).     Marland Kitchen carisoprodol (SOMA) 350 MG tablet Take 350 mg by mouth daily as needed (severe neck pain/stiffiness).    . cetirizine (ZYRTEC) 10 MG tablet Take 10 mg by mouth every evening.    . Cholecalciferol (VITAMIN D-3) 125 MCG (5000 UT) TABS Take 5,000 Units by mouth at bedtime.    . cyclobenzaprine (FLEXERIL) 5 MG tablet Take 5 mg by mouth at bedtime.    . dicyclomine (BENTYL) 10 MG capsule Take 10 mg by mouth 3 (three) times daily as needed for spasms (IBS).     Marland Kitchen escitalopram (LEXAPRO) 10 MG tablet Take 10 mg by mouth every morning.     . fluticasone (FLONASE) 50 MCG/ACT nasal spray Place 2 sprays into the  nose daily as needed (allergies.).     Marland Kitchen gabapentin (NEURONTIN) 100 MG capsule Take 200 mg by mouth 2 (two) times daily as needed (arthritis pain.).     Marland Kitchen glipiZIDE (GLUCOTROL XL) 10 MG 24 hr tablet Take 10 tablets by mouth 2 (two) times daily.    . hydrochlorothiazide (MICROZIDE) 12.5 MG capsule Take 12.5 mg by mouth daily.     Marland Kitchen HYDROcodone-acetaminophen (NORCO/VICODIN) 5-325 MG tablet Take 1 tablet by mouth every 4 (four) hours as needed.    . hyoscyamine (LEVSIN) 0.125 MG tablet Take 0.125 mg by mouth every 6 (six) hours as needed. PT HAS NOT PICKED UP RX AS OF 12-14-19    . Magnesium Oxide (MAG-200 PO) Take  200 mg by mouth at bedtime.    . Methylcellulose, Laxative, (CITRUCEL) 500 MG TABS Take 500 mg by mouth daily.    . montelukast (SINGULAIR) 10 MG tablet Take 10 mg by mouth every morning.     . pantoprazole (PROTONIX) 40 MG tablet Take 40 mg by mouth every morning.     . Potassium 99 MG TABS Take 99 mg by mouth daily.     . Probiotic Product (DIGESTIVE ADVANTAGE GUMMIES PO) Take 2 tablets by mouth every evening.    . silver sulfADIAZINE (SILVADENE) 1 % cream Apply 1 application topically 2 (two) times daily. 50 g 2  . sucralfate (CARAFATE) 1 G tablet Take 1 g by mouth 2 (two) times daily.     Marland Kitchen topiramate (TOPAMAX) 25 MG tablet Take 25 mg by mouth 2 (two) times daily.     No current facility-administered medications for this visit.    OBJECTIVE: Vitals:   03/13/20 1414  Temp: (!) 96.5 F (35.8 C)     Body mass index is 43.48 kg/m.    ECOG FS:0 - Asymptomatic  General: Well-developed, well-nourished, no acute distress. Eyes: Pink conjunctiva, anicteric sclera. HEENT: Normocephalic, moist mucous membranes. Breast: Right breast and axilla with skin erythema. Lungs: No audible wheezing or coughing. Heart: Regular rate and rhythm. Abdomen: Soft, nontender, no obvious distention. Musculoskeletal: No edema, cyanosis, or clubbing. Neuro: Alert, answering all questions appropriately.  Cranial nerves grossly intact. Skin: No rashes or petechiae noted. Psych: Normal affect.   LAB RESULTS:  Lab Results  Component Value Date   NA 136 06/17/2019   K 3.3 (L) 06/17/2019   CL 97 (L) 06/17/2019   CO2 28 06/17/2019   GLUCOSE 256 (H) 06/17/2019   BUN 17 06/17/2019   CREATININE 0.66 06/17/2019   CALCIUM 9.0 06/17/2019   PROT 7.7 06/17/2019   ALBUMIN 4.1 06/17/2019   AST 18 06/17/2019   ALT 25 06/17/2019   ALKPHOS 136 (H) 06/17/2019   BILITOT 0.2 (L) 06/17/2019   GFRNONAA >60 06/17/2019   GFRAA >60 06/17/2019    Lab Results  Component Value Date   WBC 9.6 03/06/2020   HGB 13.5 03/06/2020   HCT 41.6 03/06/2020   MCV 90.2 03/06/2020   PLT 338 03/06/2020     STUDIES: No results found.  ASSESSMENT: Right breast DCIS.  PLAN:    1.  Right breast DCIS: Patient underwent lumpectomy on December 19, 2019 confirming diagnosis.  Previously, she declined enrollment in the COMET trial.  She will complete XRT on Mar 14, 2020.  She was given a prescription for tamoxifen today and instructed to initiate it later this week.  Return to clinic in 3 months for further evaluation and to assess her toleration of tamoxifen. 2.  Weight gain: Patient is highly concerned with weight gain with tamoxifen, but agreed to take it as above and will readdress at next clinic visit.   3.  Genetics: Negative.   I spent a total of 20 minutes reviewing chart data, face-to-face evaluation with the patient, counseling and coordination of care as detailed above.   Patient expressed understanding and was in agreement with this plan. She also understands that She can call clinic at any time with any questions, concerns, or complaints.   Cancer Staging Ductal carcinoma in situ (DCIS) of right breast Staging form: Breast, AJCC 8th Edition - Clinical stage from 11/25/2019: Stage 0 (cTis (DCIS), cN0, cM0, ER+, PR+, HER2-) - Signed by Lloyd Huger, MD on 11/25/2019  Lloyd Huger, MD    03/13/2020 3:38 PM

## 2020-03-12 ENCOUNTER — Ambulatory Visit: Payer: BC Managed Care – PPO

## 2020-03-12 ENCOUNTER — Ambulatory Visit
Admission: RE | Admit: 2020-03-12 | Discharge: 2020-03-12 | Disposition: A | Discharge status: Home / Self Care | Payer: BC Managed Care – PPO | Source: Ambulatory Visit | Attending: Radiation Oncology | Admitting: Radiation Oncology

## 2020-03-12 DIAGNOSIS — D0511 Intraductal carcinoma in situ of right breast: Secondary | ICD-10-CM | POA: Diagnosis not present

## 2020-03-13 ENCOUNTER — Ambulatory Visit
Admission: RE | Admit: 2020-03-13 | Discharge: 2020-03-13 | Disposition: A | Discharge status: Home / Self Care | Payer: BC Managed Care – PPO | Source: Ambulatory Visit | Attending: Radiation Oncology | Admitting: Radiation Oncology

## 2020-03-13 ENCOUNTER — Encounter: Payer: Self-pay | Admitting: Oncology

## 2020-03-13 ENCOUNTER — Other Ambulatory Visit: Payer: Self-pay

## 2020-03-13 ENCOUNTER — Inpatient Hospital Stay: Payer: BC Managed Care – PPO | Admitting: Oncology

## 2020-03-13 ENCOUNTER — Ambulatory Visit: Payer: BC Managed Care – PPO

## 2020-03-13 VITALS — Temp 96.5°F | Wt 230.1 lb

## 2020-03-13 DIAGNOSIS — D0511 Intraductal carcinoma in situ of right breast: Secondary | ICD-10-CM

## 2020-03-13 NOTE — Progress Notes (Signed)
Patient here today for follow up. Complains of some heaviness in chest that has been going on for about a week. She states comes and goes and can tell when she coughs or lays down. She has tried using her asthma medication (breo ellipta) but not sure if that will help since she just used it today. Patient would also like to discuss taking an estrogen pill. Has concerns on whether or not  it will cause her to gain weight.

## 2020-03-14 ENCOUNTER — Ambulatory Visit
Admission: RE | Admit: 2020-03-14 | Discharge: 2020-03-14 | Disposition: A | Discharge status: Home / Self Care | Payer: BC Managed Care – PPO | Source: Ambulatory Visit | Attending: Radiation Oncology | Admitting: Radiation Oncology

## 2020-03-14 ENCOUNTER — Other Ambulatory Visit: Payer: Self-pay

## 2020-03-14 DIAGNOSIS — D0511 Intraductal carcinoma in situ of right breast: Secondary | ICD-10-CM | POA: Diagnosis not present

## 2020-03-14 MED ORDER — TAMOXIFEN CITRATE 20 MG PO TABS: 20.0000 mg | ORAL_TABLET | Freq: Every day | ORAL | 0 refills | Status: DC

## 2020-03-14 MED ORDER — TAMOXIFEN CITRATE 20 MG PO TABS: 20.0000 mg | ORAL_TABLET | Freq: Every day | ORAL | 3 refills | Status: DC

## 2020-03-14 NOTE — Addendum Note (Signed)
Addended by: Ruthell Rummage A on: 03/14/2020 03:18 PM   Modules accepted: Orders

## 2020-04-25 ENCOUNTER — Encounter: Payer: Self-pay | Admitting: Radiation Oncology

## 2020-04-25 ENCOUNTER — Other Ambulatory Visit: Payer: Self-pay

## 2020-04-25 ENCOUNTER — Ambulatory Visit
Admission: RE | Admit: 2020-04-25 | Discharge: 2020-04-25 | Disposition: A | Discharge status: Home / Self Care | Payer: BC Managed Care – PPO | Source: Ambulatory Visit | Attending: Radiation Oncology | Admitting: Radiation Oncology

## 2020-04-25 VITALS — BP 135/94 | HR 96 | Temp 97.8°F | Resp 16 | Wt 229.4 lb

## 2020-04-25 DIAGNOSIS — Z79810 Long term (current) use of selective estrogen receptor modulators (SERMs): Secondary | ICD-10-CM | POA: Diagnosis not present

## 2020-04-25 DIAGNOSIS — Z923 Personal history of irradiation: Secondary | ICD-10-CM | POA: Insufficient documentation

## 2020-04-25 DIAGNOSIS — D0511 Intraductal carcinoma in situ of right breast: Secondary | ICD-10-CM | POA: Insufficient documentation

## 2020-04-25 DIAGNOSIS — Z170 Estrogen receptor positive status [ER+]: Secondary | ICD-10-CM | POA: Diagnosis not present

## 2020-04-25 NOTE — Progress Notes (Signed)
Radiation Oncology Follow up Note  Name: Diane Cox   Date:   04/25/2020 MRN:  [id number] DOB: 1961-04-04    This 59 y.o. female presents to the clinic today for 1 month follow-up status post whole breast radiation to her right breast for ductal carcinoma in situ ER positive.  REFERRING PROVIDER: [name], MD  HPI: Patient is a 59 year old female now at 1 month having completed whole breast radiation to her right breast for ER positive ductal carcinoma in situ seen today in routine follow-up she is doing well quite fatigued still.  She has been started on.  Tamoxifen is tolerating it well without side effect.  She specifically denies breast tenderness cough or bone pain.  COMPLICATIONS OF TREATMENT: none  FOLLOW UP COMPLIANCE: keeps appointments   PHYSICAL EXAM:  BP (!) 135/94 (BP Location: Left Arm, Patient Position: Sitting, Cuff Size: Large)   Pulse 96   Temp 97.8 F (36.6 C) (Tympanic)   Resp 16   Wt 229 lb 6.4 oz (104.1 kg)   BMI 43.34 kg/m  Lungs are clear to A&P cardiac examination essentially unremarkable with regular rate and rhythm. No dominant mass or nodularity is noted in either breast in 2 positions examined. Incision is well-healed. No axillary or supraclavicular adenopathy is appreciated. Cosmetic result is excellent.  Well-developed well-nourished patient in NAD. HEENT reveals PERLA, EOMI, discs not visualized.  Oral cavity is clear. No oral mucosal lesions are identified. Neck is clear without evidence of cervical or supraclavicular adenopathy. Lungs are clear to A&P. Cardiac examination is essentially unremarkable with regular rate and rhythm without murmur rub or thrill. Abdomen is benign with no organomegaly or masses noted. Motor sensory and DTR levels are equal and symmetric in the upper and lower extremities. Cranial nerves II through XII are grossly intact. Proprioception is intact. No peripheral adenopathy or edema is identified. No motor or sensory levels  are noted. Crude visual fields are within normal range.  RADIOLOGY RESULTS: No current films to review  PLAN: Present time patient is doing well 1 month out from whole breast radiation and pleased with her overall progress.  She continues on tamoxifen without side effect.  Patient knows to call with any concerns.  I would like to take this opportunity to thank you for allowing me to participate in the care of your patient.Noreene Filbert, MD

## 2020-04-30 ENCOUNTER — Telehealth: Payer: Self-pay

## 2020-04-30 NOTE — Telephone Encounter (Signed)
T/C to pt to schedule SCP visit but no answer and mail box was full so could not leave message.  Will try again later.

## 2020-05-08 ENCOUNTER — Telehealth: Payer: Self-pay | Admitting: *Deleted

## 2020-05-08 NOTE — Telephone Encounter (Signed)
Patient requesting a return call regarding the insurance paperwork she left with Dr Olena Leatherwood nurse [phone]

## 2020-06-09 NOTE — Progress Notes (Signed)
  [place]  Telephone:(336) [phone] Fax:(336) [phone]  ID: Kahley Leib OB: 1960/12/25  MR#: [id number]  JXK#:[id number]  Patient Care Team: [name], MD as PCP - General (Family Medicine) Theodore Demark, RN as Registered Nurse (Oncology) Rico Junker, RN as Registered Nurse (Oncology) Herbert Pun, MD as Consulting Physician (General Surgery) Noreene Filbert, MD as Referring Physician (Radiation Oncology) Lloyd Huger, MD as Consulting Physician (Oncology)    Lloyd Huger, MD   06/16/2020 2:38 PM     This encounter was created in error - please disregard.

## 2020-06-15 ENCOUNTER — Inpatient Hospital Stay: Payer: BC Managed Care – PPO | Admitting: Oncology

## 2020-06-20 ENCOUNTER — Inpatient Hospital Stay: Payer: BC Managed Care – PPO | Attending: Oncology | Admitting: Oncology

## 2020-06-20 ENCOUNTER — Encounter: Payer: Self-pay | Admitting: Oncology

## 2020-06-20 ENCOUNTER — Other Ambulatory Visit: Payer: Self-pay

## 2020-06-20 VITALS — BP 148/79 | HR 84 | Temp 98.6°F | Resp 16 | Wt 238.5 lb

## 2020-06-20 DIAGNOSIS — Z9049 Acquired absence of other specified parts of digestive tract: Secondary | ICD-10-CM | POA: Diagnosis not present

## 2020-06-20 DIAGNOSIS — Z808 Family history of malignant neoplasm of other organs or systems: Secondary | ICD-10-CM | POA: Diagnosis not present

## 2020-06-20 DIAGNOSIS — Z7984 Long term (current) use of oral hypoglycemic drugs: Secondary | ICD-10-CM | POA: Insufficient documentation

## 2020-06-20 DIAGNOSIS — Z8049 Family history of malignant neoplasm of other genital organs: Secondary | ICD-10-CM | POA: Diagnosis not present

## 2020-06-20 DIAGNOSIS — D0511 Intraductal carcinoma in situ of right breast: Secondary | ICD-10-CM | POA: Diagnosis not present

## 2020-06-20 DIAGNOSIS — J45909 Unspecified asthma, uncomplicated: Secondary | ICD-10-CM | POA: Diagnosis not present

## 2020-06-20 DIAGNOSIS — Z7951 Long term (current) use of inhaled steroids: Secondary | ICD-10-CM | POA: Insufficient documentation

## 2020-06-20 DIAGNOSIS — Z8249 Family history of ischemic heart disease and other diseases of the circulatory system: Secondary | ICD-10-CM | POA: Insufficient documentation

## 2020-06-20 DIAGNOSIS — Z923 Personal history of irradiation: Secondary | ICD-10-CM | POA: Insufficient documentation

## 2020-06-20 DIAGNOSIS — E119 Type 2 diabetes mellitus without complications: Secondary | ICD-10-CM | POA: Insufficient documentation

## 2020-06-20 DIAGNOSIS — Z801 Family history of malignant neoplasm of trachea, bronchus and lung: Secondary | ICD-10-CM | POA: Diagnosis not present

## 2020-06-20 DIAGNOSIS — Z79899 Other long term (current) drug therapy: Secondary | ICD-10-CM | POA: Diagnosis not present

## 2020-06-20 DIAGNOSIS — Z90710 Acquired absence of both cervix and uterus: Secondary | ICD-10-CM | POA: Diagnosis not present

## 2020-06-20 DIAGNOSIS — Z800 Family history of malignant neoplasm of digestive organs: Secondary | ICD-10-CM | POA: Insufficient documentation

## 2020-06-20 DIAGNOSIS — Z170 Estrogen receptor positive status [ER+]: Secondary | ICD-10-CM | POA: Insufficient documentation

## 2020-06-20 DIAGNOSIS — Z79810 Long term (current) use of selective estrogen receptor modulators (SERMs): Secondary | ICD-10-CM | POA: Diagnosis not present

## 2020-06-20 DIAGNOSIS — Z803 Family history of malignant neoplasm of breast: Secondary | ICD-10-CM | POA: Diagnosis not present

## 2020-06-20 NOTE — Progress Notes (Signed)
Diane Cox  [phone] [phone]) [phone]  ID: Terese Door OB: [birth year]  MR#: [id number]  UMP#:[id number]  Patient Care Team: [name], MD as PCP - General (Family Medicine) Theodore Demark, RN as Registered Nurse (Oncology) Rico Junker, RN as Registered Nurse (Oncology) Herbert Pun, MD as Consulting Physician (General Surgery) Noreene Filbert, MD as Referring Physician (Radiation Oncology) Lloyd Huger, MD as Consulting Physician (Oncology)  CHIEF COMPLAINT: Right breast DCIS  INTERVAL HISTORY: Patient returns to clinic today for further evaluation and to assess her toleration of tamoxifen.  She has had an approximately 8 to 10 pound weight gain over the past several months, but otherwise is tolerating treatments well.  She has no neurologic complaints.  She denies any recent fevers or illnesses.  She has a good appetite.  She has no chest pain, shortness of breath, cough, or hemoptysis.  She denies any nausea, vomiting, constipation, or diarrhea.  She has no urinary complaints.  Patient offers no further specific complaints today.  REVIEW OF SYSTEMS:   Review of Systems  Constitutional: Negative.  Negative for fever, malaise/fatigue and weight loss.  Respiratory: Negative.  Negative for cough, hemoptysis and shortness of breath.   Cardiovascular: Negative.  Negative for chest pain and leg swelling.  Gastrointestinal: Negative.  Negative for abdominal pain.  Genitourinary: Negative.  Negative for dysuria.  Musculoskeletal: Negative.  Negative for back pain.  Skin: Negative.  Negative for rash.  Neurological: Negative.  Negative for dizziness, focal weakness, weakness and headaches.  Psychiatric/Behavioral: Negative.  The patient is not nervous/anxious.     As per HPI. Otherwise, a complete review of systems is negative.  PAST MEDICAL HISTORY: Past Medical History:  Diagnosis Date  . Anemia   . Arthritis   .  Asthma    WELL CONTROLLED  . Cancer ([place])    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 11/25/19  . Complication of anesthesia   . DDD (degenerative disc disease), cervical    AND SPINE  . Diabetes mellitus without complication ([place])   . Elevated lipids   . Family history of breast cancer   . Family history of colon cancer   . Family history of uterine cancer   . Fatty liver    H/O  . GERD (gastroesophageal reflux disease)   . History of kidney stones    H/O  . IBS (irritable bowel syndrome)   . IBS (irritable bowel syndrome)   . Migraines   . Obesity   . 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 Seasonal allergies   . Sleep apnea    USES CPAP    PAST SURGICAL HISTORY: Past Surgical History:  Procedure Laterality Date  . ABDOMINAL HYSTERECTOMY    . BREAST BIOPSY Right 11/17/2019   affirm bx, coil marker, DUCTAL CARCINOMA IN SITU, INTERMEDIATE GRADE  . CHOLECYSTECTOMY    . COLONOSCOPY WITH PROPOFOL N/A 11/25/2016   Procedure: COLONOSCOPY WITH PROPOFOL;  Surgeon: Lollie Sails, MD;  Location[facility] ENDOSCOPY;  Service: Endoscopy;  Laterality: N/A;  . ESOPHAGOGASTRODUODENOSCOPY (EGD) WITH PROPOFOL N/A 11/25/2016   Procedure: ESOPHAGOGASTRODUODENOSCOPY (EGD) WITH PROPOFOL;  Surgeon: Lollie Sails, MD;  Location: [facility] ENDOSCOPY;  Service: Endoscopy;  Laterality: N/A;  . EXCISION OF ENDOMETRIOMA    . PARTIAL MASTECTOMY WITH NEEDLE LOCALIZATION Right 12/19/2019   Procedure: PARTIAL MASTECTOMY WITH NEEDLE LOCALIZATION;  Surgeon: Herbert Pun, MD;  Location: [facility] ORS;  Service: General;  Laterality: Right;  .  TONSILLECTOMY      FAMILY HISTORY: Family History  Problem Relation Age of Onset  . Heart attack Father   . Uterine cancer Mother        dx 49s  . Breast cancer Maternal Aunt        dx 47s, recurrence in 70s  . Uterine cancer Maternal Grandmother        dx 50s-60s  . Uterine cancer Sister        dx 30s  . Bone cancer Brother         dx 10s  . Colon cancer Maternal Aunt   . Stomach cancer Maternal Aunt   . Throat cancer Maternal Aunt   . Colon cancer Cousin     ADVANCED DIRECTIVES (Y/N):  N  HEALTH MAINTENANCE: Social History   Tobacco Use  . Smoking status: Never Smoker  . Smokeless tobacco: Never Used  Vaping Use  . Vaping Use: Never used  Substance Use Topics  . Alcohol use: Yes    Alcohol/week: 0.0 standard drinks    Comment: occasional  . Drug use: No     Colonoscopy:  PAP:  Bone density:  Lipid panel:  Allergies  Allergen Reactions  . Chicken Allergy Rash  . Compazine [Prochlorperazine Edisylate] Nausea And Vomiting and Anxiety  . Other Rash    BLOOD THINNERS-THE COMPONENT IN BLOOD THINNERS CAUSE A RASH AT INJECTION SITE BLOOD THINNERS-THE COMPONENT IN BLOOD THINNERS CAUSE A RASH AT INJECTION SITE  . Pork-Derived Products Rash    Current Outpatient Medications  Medication Sig Dispense Refill  . albuterol (PROVENTIL HFA;VENTOLIN HFA) 108 (90 BASE) MCG/ACT inhaler Inhale 4-6 puffs by mouth every 4 hours as needed for wheezing, cough, and/or shortness of breath (Patient taking differently: Inhale 1-2 puffs into the lungs every 4 (four) hours as needed (wheeze, cough and/or shortness of breath). ) 1 Inhaler 1  . benzonatate (TESSALON) 100 MG capsule Take 100 mg by mouth 3 (three) times daily as needed for cough.     Marland Kitchen BREO ELLIPTA 100-25 MCG/INH AEPB Inhale 1 puff into the lungs daily as needed (respiratory issues.).     Marland Kitchen carisoprodol (SOMA) 350 MG tablet Take 350 mg by mouth daily as needed (severe neck pain/stiffiness).    . cetirizine (ZYRTEC) 10 MG tablet Take 10 mg by mouth every evening.    . Cholecalciferol (VITAMIN D-3) 125 MCG (5000 UT) TABS Take 5,000 Units by mouth at bedtime.    . cyclobenzaprine (FLEXERIL) 5 MG tablet Take 5 mg by mouth at bedtime.    . dicyclomine (BENTYL) 10 MG capsule Take 10 mg by mouth 3 (three) times daily as needed for spasms (IBS).     Marland Kitchen escitalopram  (LEXAPRO) 10 MG tablet Take 10 mg by mouth every morning.     . fluticasone (FLONASE) 50 MCG/ACT nasal spray Place 2 sprays into the nose daily as needed (allergies.).     Marland Kitchen gabapentin (NEURONTIN) 100 MG capsule Take 200 mg by mouth 2 (two) times daily as needed (arthritis pain.).     Marland Kitchen glipiZIDE (GLUCOTROL XL) 10 MG 24 hr tablet Take 10 tablets by mouth 2 (two) times daily.    . hydrochlorothiazide (MICROZIDE) 12.5 MG capsule Take 12.5 mg by mouth daily.     Marland Kitchen HYDROcodone-acetaminophen (NORCO/VICODIN) 5-325 MG tablet Take 1 tablet by mouth every 4 (four) hours as needed.    . hyoscyamine (LEVSIN) 0.125 MG tablet Take 0.125 mg by mouth every 6 (six) hours as needed. PT HAS  NOT PICKED UP RX AS OF 12-14-19    . Magnesium Oxide (MAG-200 PO) Take 200 mg by mouth at bedtime.    . Methylcellulose, Laxative, (CITRUCEL) 500 MG TABS Take 500 mg by mouth daily.    . montelukast (SINGULAIR) 10 MG tablet Take 10 mg by mouth every morning.     . pantoprazole (PROTONIX) 40 MG tablet Take 40 mg by mouth every morning.     . Potassium 99 MG TABS Take 99 mg by mouth daily.     . Probiotic Product (DIGESTIVE ADVANTAGE GUMMIES PO) Take 2 tablets by mouth every evening.    . rosuvastatin (CRESTOR) 10 MG tablet Take 10 mg by mouth daily.    . silver sulfADIAZINE (SILVADENE) 1 % cream Apply 1 application topically 2 (two) times daily. 50 g 2  . sucralfate (CARAFATE) 1 G tablet Take 1 g by mouth 2 (two) times daily.     . tamoxifen (NOLVADEX) 20 MG tablet Take 1 tablet (20 mg total) by mouth daily. 90 tablet 0  . topiramate (TOPAMAX) 25 MG tablet Take 25 mg by mouth 2 (two) times daily.     No current facility-administered medications for this visit.    OBJECTIVE: Vitals:   06/20/20 1001  BP: (!) 148/79  Pulse: 84  Resp: 16  Temp: 98.6 F (37 C)  SpO2: 97%     Body mass index is 45.06 kg/m.    ECOG FS:0 - Asymptomatic  General: Well-developed, well-nourished, no acute distress. Eyes: Pink conjunctiva,  anicteric sclera. HEENT: Normocephalic, moist mucous membranes. Breast: Right breast with well-healing surgical scar. Lungs: No audible wheezing or coughing. Heart: Regular rate and rhythm. Abdomen: Soft, nontender, no obvious distention. Musculoskeletal: No edema, cyanosis, or clubbing. Neuro: Alert, answering all questions appropriately. Cranial nerves grossly intact. Skin: No rashes or petechiae noted. Psych: Normal affect.   LAB RESULTS:  Lab Results  Component Value Date   NA 136 06/17/2019   K 3.3 (L) 06/17/2019   CL 97 (L) 06/17/2019   CO2 28 06/17/2019   GLUCOSE 256 (H) 06/17/2019   BUN 17 06/17/2019   CREATININE 0.66 06/17/2019   CALCIUM 9.0 06/17/2019   PROT 7.7 06/17/2019   ALBUMIN 4.1 06/17/2019   AST 18 06/17/2019   ALT 25 06/17/2019   ALKPHOS 136 (H) 06/17/2019   BILITOT 0.2 (L) 06/17/2019   GFRNONAA >60 06/17/2019   GFRAA >60 06/17/2019    Lab Results  Component Value Date   WBC 9.6 03/06/2020   HGB 13.5 03/06/2020   HCT 41.6 03/06/2020   MCV 90.2 03/06/2020   PLT 338 03/06/2020     STUDIES: No results found.  ASSESSMENT: Right breast DCIS.  PLAN:    1.  Right breast DCIS: Patient underwent lumpectomy on December 19, 2019 confirming diagnosis.  Previously, she declined enrollment in the COMET trial.  Patient completed adjuvant XRT on Mar 14, 2020.  Continue tamoxifen for a total of 5 years completing treatment in May 2026.  Return to clinic in 3 months for routine evaluation. 2.  Weight gain: Patient agreed to continue tamoxifen for now and was given a referral to the care program.   3.  Genetics: Negative.   I spent a total of 20 minutes reviewing chart data, face-to-face evaluation with the patient, counseling and coordination of care as detailed above.   Patient expressed understanding and was in agreement with this plan. She also understands that She can call clinic at any time with any questions, concerns, or  complaints.   Cancer  Staging Ductal carcinoma in situ (DCIS) of right breast Staging form: Breast, AJCC 8th Edition - Clinical stage from 11/25/2019: Stage 0 (cTis (DCIS), cN0, cM0, ER+, PR+, HER2-) - Signed by Lloyd Huger, MD on 11/25/2019   Lloyd Huger, MD   06/20/2020 11:08 AM

## 2020-06-20 NOTE — Progress Notes (Signed)
Survivorship Care Plan visit completed.  Treatment summary reviewed and given to patient.  ASCO answers booklet reviewed and given to patient.  CARE program and Cancer Transitions discussed with patient along with other resources cancer center offers to patients and caregivers.  Patient verbalized understanding.  Patient signing up for Cancer Transition, CARE and message therapy.  Patient in agreement for APP to have a Virtual visit to introduce them to the Survivorship Clinic tomorrow at 2:00.  Encouraged patient to call for any questions or concerns.

## 2020-06-21 ENCOUNTER — Inpatient Hospital Stay (HOSPITAL_BASED_OUTPATIENT_CLINIC_OR_DEPARTMENT_OTHER): Payer: BC Managed Care – PPO | Admitting: Nurse Practitioner

## 2020-06-21 DIAGNOSIS — Z86000 Personal history of in-situ neoplasm of breast: Secondary | ICD-10-CM

## 2020-06-21 NOTE — Progress Notes (Signed)
Survivorship Clinic Consult Note [facility]  Telephone:([phone] Fax:([phone]  Virtual Visit Progress Note  I connected with Clifford Coudriet on 06/21/20 at  2:00 PM EDT by telephone visit and verified that I am speaking with the correct person using two identifiers.   I discussed the limitations, risks, security and privacy concerns of performing an evaluation and management service by telemedicine and the availability of in-person appointments. I also discussed with the patient that there may be a patient responsible charge related to this service. The patient expressed understanding and agreed to proceed.   Other persons participating in the visit and their role in the encounter: none  Patient's location: home Provider's location: clinic  CLINIC: Survivorship  REASON FOR VISIT: Long-term survivorship surveillance visit for history of breast cancer  BRIEF ONCOLOGIC HISTORY:  Oncology History  Ductal carcinoma in situ (DCIS) of right breast  11/25/2019 Initial Diagnosis   Ductal carcinoma in situ (DCIS) of right breast   11/25/2019 Cancer Staging   Staging form: Breast, AJCC 8th Edition - Clinical stage from 11/25/2019: Stage 0 (cTis (DCIS), cN0, cM0, ER+, PR+, HER2-) - Signed by Lloyd Huger, MD on 11/25/2019   12/20/2019 Genetic Testing   Negative genetic testing. No pathogenic variants identified on the [facility] Breast Cancer STAT Panel + Common Hereditary Cancers Panel. The report date is 12/20/2019.   The STAT Breast cancer panel offered by [facility] includes sequencing and rearrangement analysis for the following 9 genes:  ATM, BRCA1, BRCA2, CDH1, CHEK2, PALB2, PTEN, STK11 and TP53.    The Common Hereditary Cancers Panel offered by [facility] includes sequencing and/or deletion duplication testing of the following 48 genes: APC, ATM, AXIN2, BARD1, BMPR1A, BRCA1, BRCA2, BRIP1, CDH1, CDKN2A (p14ARF), CDKN2A (p16INK4a), CKD4, CHEK2, CTNNA1, DICER1,  EPCAM (Deletion/duplication testing only), GREM1 (promoter region deletion/duplication testing only), KIT, MEN1, MLH1, MSH2, MSH3, MSH6, MUTYH, NBN, NF1, NHTL1, PALB2, PDGFRA, PMS2, POLD1, POLE, PTEN, RAD50, RAD51C, RAD51D, RNF43, SDHB, SDHC, SDHD, SMAD4, SMARCA4. STK11, TP53, TSC1, TSC2, and VHL.  The following genes were evaluated for sequence changes only: SDHA and HOXB13 c.251G>A variant only.      INTERVAL HISTORY:  Patient presents to the survivorship clinic today for initial meeting to review her survivorship care plan detailing her treatment course for breast cancer, as well as monitoring long-term side effects of that treatment, education regarding health maintenance, screening, and overall wellness and health promotion.  Overall, she reports feeling well since completing treatment.  She offers no specific complaints today.  REVIEW OF SYSTEMS:  Review of Systems  Constitutional: Positive for malaise/fatigue (working full time as report Scientist, clinical (histocompatibility and immunogenetics)). Negative for chills, fever and weight loss.  HENT: Negative for hearing loss, nosebleeds, sore throat and tinnitus.   Eyes: Negative for blurred vision and double vision.  Respiratory: Negative for cough, hemoptysis, shortness of breath and wheezing.   Cardiovascular: Negative for chest pain, palpitations and leg swelling.  Gastrointestinal: Negative for abdominal pain, blood in stool, constipation, diarrhea, melena, nausea and vomiting.  Genitourinary: Negative for dysuria and urgency.  Musculoskeletal: Negative for back pain, falls, joint pain and myalgias.  Skin: Negative for itching and rash.  Neurological: Negative for dizziness, tingling, sensory change, loss of consciousness, weakness and headaches.  Endo/Heme/Allergies: Negative for environmental allergies. Does not bruise/bleed easily.  Psychiatric/Behavioral: Negative for depression. The patient is not nervous/anxious and does not have insomnia.   Breast: No new lumps, bumps, skin  changes, discomfort, or discharge. No nipple changes.     PAST  MEDICAL/SURGICAL HISTORY:  Past Medical History:  Diagnosis Date  . Anemia   . Arthritis   . Asthma    WELL CONTROLLED  . Cancer (HCC)    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 11/25/19  . Complication of anesthesia   . DDD (degenerative disc disease), cervical    AND SPINE  . Diabetes mellitus without complication (HCC)   . Elevated lipids   . Family history of breast cancer   . Family history of colon cancer   . Family history of uterine cancer   . Fatty liver    H/O  . GERD (gastroesophageal reflux disease)   . History of kidney stones    H/O  . IBS (irritable bowel syndrome)   . IBS (irritable bowel syndrome)   . Migraines   . Obesity   . 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 Seasonal allergies   . Sleep apnea    USES CPAP   Past Surgical History:  Procedure Laterality Date  . ABDOMINAL HYSTERECTOMY    . BREAST BIOPSY Right 11/17/2019   affirm bx, coil marker, DUCTAL CARCINOMA IN SITU, INTERMEDIATE GRADE  . CHOLECYSTECTOMY    . COLONOSCOPY WITH PROPOFOL N/A 11/25/2016   Procedure: COLONOSCOPY WITH PROPOFOL;  Surgeon: Christena Deem, MD;  Location: [facility] ENDOSCOPY;  Service: Endoscopy;  Laterality: N/A;  . ESOPHAGOGASTRODUODENOSCOPY (EGD) WITH PROPOFOL N/A 11/25/2016   Procedure: ESOPHAGOGASTRODUODENOSCOPY (EGD) WITH PROPOFOL;  Surgeon: Christena Deem, MD;  Location: [facility] ENDOSCOPY;  Service: Endoscopy;  Laterality: N/A;  . EXCISION OF ENDOMETRIOMA    . PARTIAL MASTECTOMY WITH NEEDLE LOCALIZATION Right 12/19/2019   Procedure: PARTIAL MASTECTOMY WITH NEEDLE LOCALIZATION;  Surgeon: Carolan Shiver, MD;  Location: [facility] ORS;  Service: General;  Laterality: Right;  . TONSILLECTOMY      SOCIAL HISTORY:  Social History   Socioeconomic History  . Marital status: Married    Spouse name: Not on file  . Number of children: Not on file  . Years of education: Not on file  .  Highest education level: Not on file  Occupational History  . Not on file  Tobacco Use  . Smoking status: Never Smoker  . Smokeless tobacco: Never Used  Vaping Use  . Vaping Use: Never used  Substance and Sexual Activity  . Alcohol use: Yes    Alcohol/week: 0.0 standard drinks    Comment: occasional  . Drug use: No  . Sexual activity: Yes  Other Topics Concern  . Not on file  Social History Narrative  . Not on file   Social Determinants of Health   Financial Resource Strain:   . Difficulty of Paying Living Expenses: Not on file  Food Insecurity:   . Worried About Programme researcher, broadcasting/film/video in the Last Year: Not on file  . Ran Out of Food in the Last Year: Not on file  Transportation Needs:   . Lack of Transportation (Medical): Not on file  . Lack of Transportation (Non-Medical): Not on file  Physical Activity:   . Days of Exercise per Week: Not on file  . Minutes of Exercise per Session: Not on file  Stress:   . Feeling of Stress : Not on file  Social Connections:   . Frequency of Communication with Friends and Family: Not on file  . Frequency of Social Gatherings with Friends and Family: Not on file  . Attends Religious Services: Not on file  . Active Member of Clubs or  Organizations: Not on file  . Attends Archivist Meetings: Not on file  . Marital Status: Not on file     ALLERGIES:  Allergies  Allergen Reactions  . Chicken Allergy Rash  . Compazine [Prochlorperazine Edisylate] Nausea And Vomiting and Anxiety  . Other Rash    BLOOD THINNERS-THE COMPONENT IN BLOOD THINNERS CAUSE A RASH AT INJECTION SITE BLOOD THINNERS-THE COMPONENT IN BLOOD THINNERS CAUSE A RASH AT INJECTION SITE  . Pork-Derived Products Rash    CURRENT MEDICATIONS:  Outpatient Encounter Medications as of 06/21/2020  Medication Sig  . albuterol (PROVENTIL HFA;VENTOLIN HFA) 108 (90 BASE) MCG/ACT inhaler Inhale 4-6 puffs by mouth every 4 hours as needed for wheezing, cough, and/or shortness  of breath (Patient taking differently: Inhale 1-2 puffs into the lungs every 4 (four) hours as needed (wheeze, cough and/or shortness of breath). )  . benzonatate (TESSALON) 100 MG capsule Take 100 mg by mouth 3 (three) times daily as needed for cough.   Marland Kitchen BREO ELLIPTA 100-25 MCG/INH AEPB Inhale 1 puff into the lungs daily as needed (respiratory issues.).   Marland Kitchen carisoprodol (SOMA) 350 MG tablet Take 350 mg by mouth daily as needed (severe neck pain/stiffiness).  . cetirizine (ZYRTEC) 10 MG tablet Take 10 mg by mouth every evening.  . Cholecalciferol (VITAMIN D-3) 125 MCG (5000 UT) TABS Take 5,000 Units by mouth at bedtime.  . cyclobenzaprine (FLEXERIL) 5 MG tablet Take 5 mg by mouth at bedtime.  . dicyclomine (BENTYL) 10 MG capsule Take 10 mg by mouth 3 (three) times daily as needed for spasms (IBS).   Marland Kitchen escitalopram (LEXAPRO) 10 MG tablet Take 10 mg by mouth every morning.   . fluticasone (FLONASE) 50 MCG/ACT nasal spray Place 2 sprays into the nose daily as needed (allergies.).   Marland Kitchen gabapentin (NEURONTIN) 100 MG capsule Take 200 mg by mouth 2 (two) times daily as needed (arthritis pain.).   Marland Kitchen glipiZIDE (GLUCOTROL XL) 10 MG 24 hr tablet Take 10 tablets by mouth 2 (two) times daily.  . hydrochlorothiazide (MICROZIDE) 12.5 MG capsule Take 12.5 mg by mouth daily.   Marland Kitchen HYDROcodone-acetaminophen (NORCO/VICODIN) 5-325 MG tablet Take 1 tablet by mouth every 4 (four) hours as needed.  . hyoscyamine (LEVSIN) 0.125 MG tablet Take 0.125 mg by mouth every 6 (six) hours as needed. PT HAS NOT PICKED UP RX AS OF 12-14-19  . Magnesium Oxide (MAG-200 PO) Take 200 mg by mouth at bedtime.  . Methylcellulose, Laxative, (CITRUCEL) 500 MG TABS Take 500 mg by mouth daily.  . montelukast (SINGULAIR) 10 MG tablet Take 10 mg by mouth every morning.   . pantoprazole (PROTONIX) 40 MG tablet Take 40 mg by mouth every morning.   . Potassium 99 MG TABS Take 99 mg by mouth daily.   . Probiotic Product (DIGESTIVE ADVANTAGE GUMMIES  PO) Take 2 tablets by mouth every evening.  . rosuvastatin (CRESTOR) 10 MG tablet Take 10 mg by mouth daily.  . silver sulfADIAZINE (SILVADENE) 1 % cream Apply 1 application topically 2 (two) times daily.  . sucralfate (CARAFATE) 1 G tablet Take 1 g by mouth 2 (two) times daily.   . tamoxifen (NOLVADEX) 20 MG tablet Take 1 tablet (20 mg total) by mouth daily.  Marland Kitchen topiramate (TOPAMAX) 25 MG tablet Take 25 mg by mouth 2 (two) times daily.   No facility-administered encounter medications on file as of 06/21/2020.    ONCOLOGIC FAMILY HISTORY:  Family History  Problem Relation Age of Onset  . Heart attack  Father   . Uterine cancer Mother        dx 45s  . Breast cancer Maternal Aunt        dx 76s, recurrence in 27s  . Uterine cancer Maternal Grandmother        dx 50s-60s  . Uterine cancer Sister        dx 88s  . Bone cancer Brother        dx 39s  . Colon cancer Maternal Aunt   . Stomach cancer Maternal Aunt   . Throat cancer Maternal Aunt   . Colon cancer Cousin     GENETIC COUNSELING/TESTING: Was negative   Physical exam deferred given video visit  LABORATORY DATA:  No results found for: LABCA2  IMAGING STUDIES:  No recent imaging studies  ASSESSMENT & PLAN:  Ms. Bayliss is a pleasant 59 y.o. female with history of DCIS treated with back to me followed by adjuvant XRT and tamoxifen who presents to survivorship clinic for initial meeting and routine follow-up since completing treatment.  We reviewed her survivorship care plan and addressed any survivorship concerns since completing treatment.  1. History of DCIS: Ms. Hewlett is continuing to recover from definitive treatment for dcis.  Today, she received a copy of her comprehensive survivorship care plan (SCP) which was reviewed with her in detail.  The SCP details her cancer diagnosis, treatment course, potential late/long-term effects of treatments appropriate follow-up care with recommendations for future, and patient education  resources.  A copy of this summary, along with a letter will be sent to the patient's primary care provider via mail/backslash in basket after today's visit. We encouraged her to continue to follow-up with her health care team to continue to monitor help and manage any effects of treatment.  We discussed that there is a chance for the cancer can come back.  The vast majority of recurrences will be detected in the 2-3 years after treatment though late recurrences can occur.  The recommended surveillance for follow-up after diagnosis was reviewed in detail but depending on risk, we generally recommend follow up with breast exam every 3-6 months for the first 2 years then every 6-12 months for 3-5 years then annually thereafter.  We discussed that the goal of surveillance after treatment is to detect disease if it returns as early as possible.  The most useful tools for detecting recurrence include a thorough evaluation of symptoms and a physical exam which includes a complete breast exam and yearly mammograms or other imaging studies. Additional imaging may be considered based on symptoms or examination findings which is why is it is important to report concerns to her health care team.  Blood work may be performed at these visits for if there are symptoms or exam findings concerning for recurrence.  Patient was advised that if she feels that something is not right she should schedule an appointment to be evaluated.  Concerning symptoms discussed in detail.   Ms. Kliethermes will return to the survivorship clinic as needed and continue scheduled followups with her healthcare team as outlined in her SCP.   2. Survivorship Care Survey: Survivorship Care Survey completed post-treatment as recommended by the [facility]CC[facility] Survivorship Guidelines. Patient's answers were reviewed. Based on these, as well as the type of cancer and treatment she underwent, the following Survivorship Topics were discussed:   -- Anxiety, Depression, and  Distress-denies -Cognitive Function-denies -Fatigue-mild and symptoms are improving since completing treatment.  -Lymphedema-denies -Hormone-Related Symptoms- denies -Pain- denies -Sexual Function-  denies -Sleep Disorder- denies -Healthy Lifestyle- see below -Immunizations and Infections- see below.   3. Tobacco Avoidance: I commended Ms. Mcaulay's continued efforts to remain tobacco-free.  We discussed that one of the most important risk reduction strategies in preventing cancer recurrence. She is committed to abstaining from tobacco.  4. Cancer screening/Health & Wellness Promotion:  Due to Ms. Frier's history and her age, she should receive screening for skin cancers, colon cancer, and gynecologic cancers. The information and recommendations are listed on the patient's comprehensive care plan/treatment summary and were reviewed in detail with the patient.  I encouraged her to speak with her PCP about arranging these and performing annual wellness exams, as appropriate.    Colorectal cancer-for the average risk patient, screening for colon cancer is recommended beginning at age 45.  Various screening strategies exist including colonoscopy, sigmoidoscopy, stool testing for blood, etc.  You can discuss these with your primary care provider to determine which option is best for you.  4. Bone density screenings -bone mineral density testing is typically recommended after 65 or younger women with medical conditions or use of medications associated with low bone mass or bone loss.  I encouraged her to consume additional supplementation of calcium 1200 mg/day and vitamin D [phone] IU daily taken with food to enhance absorption.  We discussed the importance of weightbearing exercise and its impact on bone density.  5. Physical activity/Healthy eating: Getting adequate physical activity and maintaining a healthy diet as a cancer survivor is important for overall wellness and reduces the risk of cancer  recurrence.   We reviewed the "Nutrition Rainbow" handout, as well as the handout "Take Control of Your Health and Reduce Your Cancer Risk" from the American Cancer Society.  She was also encouraged to engage in moderate to vigorous exercise for 30 minutes per day most days of the week. We discussed the [employer] fitness program, which is designed for cancer survivors to help them become more physically fit after cancer treatments. We also discussed the CARE program which is offered 2 days a week at [facility] without cost to cancer survivors. She has previously been referred to CARE and I encouraged her participation.  We also discussed the importance and health benefits of maintaining a healthy weight and eating a balanced diet. Ms. Mcswain was encouraged to consume 5-7 servings of fruits and vegetables per day. We discussed that a healthy BMI is 18.5-24.9 and that maintaining a health weight reduces risk of cancer recurrences.   6. Support services/Counseling: Ms. Moshier was seen today in in effort to address both the physical and social concerns of our cancer survivors at [facility] at [facility]. It is not uncommon for this period of the patient's cancer care trajectory to be one of many emotions and stressors.  I provided support today through active listening, validation of concerns, and expressive supportive counseling.  Ms. Alsobrook was encouraged to take advantage of our support services programs and support groups to better cope in her new life as a cancer survivor after completing anti-cancer treatment. We also discussed Counseling Services available to Cancer Survivors through [facility].   Dispo:  - Return to [facility] for follow-up with Dr. Orlie Dakin on 09/24/2020 as scheduled. - Return to survivorship clinic as needed; no additional follow-up needed at this time.  - Follow up with gyn for annual pelvic exams given tamoxifen use.  - Consider transitioning the patient to long-term  survivorship, when clinically appropriate.   I discussed the assessment and treatment  plan with the patient. The patient was provided an opportunity to ask questions and all were answered. The patient agreed with the plan and demonstrated an understanding of the instructions.   The patient was advised to call back or seek an in-person evaluation if the symptoms worsen or if the condition fails to improve as anticipated.   I provided 38 minutes of face-to-face video visit time during this encounter, and > 50% was spent counseling as documented under my assessment & plan.  Beckey Rutter, DNP, AGNP-C [place] [facility]  Note: PRIMARY CARE PROVIDER [name], [place] [phone]

## 2020-06-22 NOTE — Patient Instructions (Signed)
As we discussed, you may notice some improvement in your symptoms with acupuncture. Given your history of breast cancer, you may be eligible to have the cost of 6 sessions covered by a grant. I will contact your nurse navigators Al Pimple and Tanya Nones who will request approval from the clinics and then the clinic will contact you to set up an appointment. Please let me know if you do not hear something from them in the next week.   - [facility] and [facility]) or [phone] Phillip Heal)

## 2020-07-16 ENCOUNTER — Telehealth: Payer: Self-pay

## 2020-07-16 NOTE — Telephone Encounter (Signed)
On 07/12/20 patient came into cancer center and dropped off disability parking placard form to be signed by Dr. Grayland Ormond. Patient told receptionist provider had said he would fill out.   Consulted with provider about form and patient. Provider requested more information on why patient needed placard.   Called patient several times on 9/10 and left messages for patient to return call. Patient returned call after hours and left message.  Returned patient's call today to inform them of information needed by provider. Patient states has been having trouble with fibromyalgia and fatigue. Patient states pain has been causing her to drag feet and she has had to start using cane to help walk. She request placard to help so she doesn't have to park and walk as far due to pain.   Routing to provider to advise.

## 2020-07-16 NOTE — Telephone Encounter (Signed)
Provider advised that he doesn't feel this is appropriate to be filled out due to diagnosis she is being treated here for. He states pcp would be more appropriate person to contact.   Informed patient of provider's response. She verbalized understanding and denied further questions at this time.

## 2020-07-24 ENCOUNTER — Encounter: Payer: Self-pay | Admitting: *Deleted

## 2020-08-24 ENCOUNTER — Encounter: Payer: Self-pay | Admitting: Nurse Practitioner

## 2020-09-22 NOTE — Progress Notes (Signed)
[place]  [phone] [phone]  ID: Terese Door OB: Jun 16, 1961  MR#: [id number]  BOF#:[id number]  Patient Care Team: [name], MD as PCP - General (Family Medicine) Theodore Demark, RN as Registered Nurse (Oncology) Rico Junker, RN as Registered Nurse (Oncology) Herbert Pun, MD as Consulting Physician (General Surgery) Noreene Filbert, MD as Referring Physician (Radiation Oncology) Lloyd Huger, MD as Consulting Physician (Oncology)  CHIEF COMPLAINT: Right breast DCIS  INTERVAL HISTORY: Patient returns to clinic today for routine 10-monthevaluation.  She currently feels well and is asymptomatic.  She is tolerating tamoxifen without significant side effects. She has no neurologic complaints.  She denies any recent fevers or illnesses.  She has a good appetite.  She has no chest pain, shortness of breath, cough, or hemoptysis.  She denies any nausea, vomiting, constipation, or diarrhea.  She has no urinary complaints.  Patient offers no specific complaints today.  REVIEW OF SYSTEMS:   Review of Systems  Constitutional: Negative.  Negative for fever, malaise/fatigue and weight loss.  Respiratory: Negative.  Negative for cough, hemoptysis and shortness of breath.   Cardiovascular: Negative.  Negative for chest pain and leg swelling.  Gastrointestinal: Negative.  Negative for abdominal pain.  Genitourinary: Negative.  Negative for dysuria.  Musculoskeletal: Negative.  Negative for back pain.  Skin: Negative.  Negative for rash.  Neurological: Negative.  Negative for dizziness, focal weakness, weakness and headaches.  Psychiatric/Behavioral: Negative.  The patient is not nervous/anxious.     As per HPI. Otherwise, a complete review of systems is negative.  PAST MEDICAL HISTORY: Past Medical History:  Diagnosis Date  . Anemia   . Arthritis   . Asthma    WELL CONTROLLED  . [place]    [place] cancer with  lump eith rad tx. 11/25/19  . Complication of anesthesia   . DDD (degenerative disc disease), cervical    AND SPINE  . Diabetes mellitus without complication [place]   . Elevated lipids   . Family history of breast cancer   . Family history of colon cancer   . Family history of uterine cancer   . Fatty liver    H/O  . GERD (gastroesophageal reflux disease)   . History of kidney stones    H/O  . IBS (irritable bowel syndrome)   . IBS (irritable bowel syndrome)   . Migraines   . Obesity   . 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 Seasonal allergies   . Sleep apnea    USES CPAP    PAST SURGICAL HISTORY: Past Surgical History:  Procedure Laterality Date  . ABDOMINAL HYSTERECTOMY    . BREAST BIOPSY Right 11/17/2019   affirm bx, coil marker, DUCTAL CARCINOMA IN SITU, INTERMEDIATE GRADE  . CHOLECYSTECTOMY    . COLONOSCOPY WITH PROPOFOL N/A 11/25/2016   Procedure: COLONOSCOPY WITH PROPOFOL;  Surgeon: MLollie Sails MD;  [facility];  Service: Endoscopy;  Laterality: N/A;  . ESOPHAGOGASTRODUODENOSCOPY (EGD) WITH PROPOFOL N/A 11/25/2016   Procedure: ESOPHAGOGASTRODUODENOSCOPY (EGD) WITH PROPOFOL;  Surgeon: MLollie Sails MD;  [facility];  Service: Endoscopy;  Laterality: N/A;  . EXCISION OF ENDOMETRIOMA    . PARTIAL MASTECTOMY WITH NEEDLE LOCALIZATION Right 12/19/2019   Procedure: PARTIAL MASTECTOMY WITH NEEDLE LOCALIZATION;  Surgeon: CHerbert Pun MD;  Location: [facility] ORS;  Service: General;  Laterality: Right;  . TONSILLECTOMY      FAMILY HISTORY: Family History  Problem Relation Age  of Onset  . Heart attack Father   . Uterine cancer Mother        dx 68s  . Breast cancer Maternal Aunt        dx 59s, recurrence in 66s  . Uterine cancer Maternal Grandmother        dx 50s-60s  . Uterine cancer Sister        dx 11s  . Bone cancer Brother        dx 4s  . Colon cancer Maternal Aunt   . Stomach cancer Maternal  Aunt   . Throat cancer Maternal Aunt   . Colon cancer Cousin     ADVANCED DIRECTIVES (Y/N):  N  HEALTH MAINTENANCE: Social History   Tobacco Use  . Smoking status: Never Smoker  . Smokeless tobacco: Never Used  Vaping Use  . Vaping Use: Never used  Substance Use Topics  . Alcohol use: Yes    Alcohol/week: 0.0 standard drinks    Comment: occasional  . Drug use: No     Colonoscopy:  PAP:  Bone density:  Lipid panel:  Allergies  Allergen Reactions  . Chicken Allergy Rash  . Compazine [Prochlorperazine Edisylate] Nausea And Vomiting and Anxiety  . Other Rash    BLOOD THINNERS-THE COMPONENT IN BLOOD THINNERS CAUSE A RASH AT INJECTION SITE BLOOD THINNERS-THE COMPONENT IN BLOOD THINNERS CAUSE A RASH AT INJECTION SITE  . Pork-Derived Products Rash    Current Outpatient Medications  Medication Sig Dispense Refill  . albuterol (PROVENTIL HFA;VENTOLIN HFA) 108 (90 BASE) MCG/ACT inhaler Inhale 4-6 puffs by mouth every 4 hours as needed for wheezing, cough, and/or shortness of breath (Patient taking differently: Inhale 1-2 puffs into the lungs every 4 (four) hours as needed (wheeze, cough and/or shortness of breath). ) 1 Inhaler 1  . benzonatate (TESSALON) 100 MG capsule Take 100 mg by mouth 3 (three) times daily as needed for cough.     Marland Kitchen BREO ELLIPTA 100-25 MCG/INH AEPB Inhale 1 puff into the lungs daily as needed (respiratory issues.).     Marland Kitchen carisoprodol (SOMA) 350 MG tablet Take 350 mg by mouth daily as needed (severe neck pain/stiffiness).    . cetirizine (ZYRTEC) 10 MG tablet Take 10 mg by mouth every evening.    . Cholecalciferol (VITAMIN D-3) 125 MCG (5000 UT) TABS Take 5,000 Units by mouth at bedtime.    . cyclobenzaprine (FLEXERIL) 5 MG tablet Take 5 mg by mouth at bedtime.    . dicyclomine (BENTYL) 10 MG capsule Take 10 mg by mouth 3 (three) times daily as needed for spasms (IBS).     Marland Kitchen escitalopram (LEXAPRO) 10 MG tablet Take 10 mg by mouth every morning.     .  fluticasone (FLONASE) 50 MCG/ACT nasal spray Place 2 sprays into the nose daily as needed (allergies.).     Marland Kitchen gabapentin (NEURONTIN) 100 MG capsule Take 200 mg by mouth 2 (two) times daily as needed (arthritis pain.).     Marland Kitchen glipiZIDE (GLUCOTROL XL) 10 MG 24 hr tablet Take 10 tablets by mouth 2 (two) times daily.    . hydrochlorothiazide (MICROZIDE) 12.5 MG capsule Take 12.5 mg by mouth daily.     Marland Kitchen HYDROcodone-acetaminophen (NORCO/VICODIN) 5-325 MG tablet Take 1 tablet by mouth every 4 (four) hours as needed.    . hyoscyamine (LEVSIN) 0.125 MG tablet Take 0.125 mg by mouth every 6 (six) hours as needed. PT HAS NOT PICKED UP RX AS OF 12-14-19    . Magnesium Oxide (MAG-200  PO) Take 200 mg by mouth at bedtime.    . Methylcellulose, Laxative, (CITRUCEL) 500 MG TABS Take 500 mg by mouth daily.    . montelukast (SINGULAIR) 10 MG tablet Take 10 mg by mouth every morning.     . pantoprazole (PROTONIX) 40 MG tablet Take 40 mg by mouth every morning.     . Potassium 99 MG TABS Take 99 mg by mouth daily.     . Probiotic Product (DIGESTIVE ADVANTAGE GUMMIES PO) Take 2 tablets by mouth every evening.    . rosuvastatin (CRESTOR) 10 MG tablet Take 10 mg by mouth daily.    . silver sulfADIAZINE (SILVADENE) 1 % cream Apply 1 application topically 2 (two) times daily. 50 g 2  . sucralfate (CARAFATE) 1 G tablet Take 1 g by mouth 2 (two) times daily.     . tamoxifen (NOLVADEX) 20 MG tablet Take 1 tablet (20 mg total) by mouth daily. 90 tablet 0  . topiramate (TOPAMAX) 25 MG tablet Take 25 mg by mouth 2 (two) times daily.     No current facility-administered medications for this visit.    OBJECTIVE: Vitals:   09/24/20 1116  BP: 128/87  Pulse: 92  Temp: 98 F (36.7 C)  SpO2: 97%     Body mass index is 45.01 kg/m.    ECOG FS:0 - Asymptomatic  General: Well-developed, well-nourished, no acute distress. Eyes: Pink conjunctiva, anicteric sclera. HEENT: Normocephalic, moist mucous membranes. Breasts: Exam  deferred today. Lungs: No audible wheezing or coughing. Heart: Regular rate and rhythm. Abdomen: Soft, nontender, no obvious distention. Musculoskeletal: No edema, cyanosis, or clubbing. Neuro: Alert, answering all questions appropriately. Cranial nerves grossly intact. Skin: No rashes or petechiae noted. Psych: Normal affect.  LAB RESULTS:  Lab Results  Component Value Date   NA 136 06/17/2019   K 3.3 (L) 06/17/2019   CL 97 (L) 06/17/2019   CO2 28 06/17/2019   GLUCOSE 256 (H) 06/17/2019   BUN 17 06/17/2019   CREATININE 0.66 06/17/2019   CALCIUM 9.0 06/17/2019   PROT 7.7 06/17/2019   ALBUMIN 4.1 06/17/2019   AST 18 06/17/2019   ALT 25 06/17/2019   ALKPHOS 136 (H) 06/17/2019   BILITOT 0.2 (L) 06/17/2019   GFRNONAA >60 06/17/2019   GFRAA >60 06/17/2019    Lab Results  Component Value Date   WBC 9.6 03/06/2020   HGB 13.5 03/06/2020   HCT 41.6 03/06/2020   MCV 90.2 03/06/2020   PLT 338 03/06/2020     STUDIES: No results found.  ASSESSMENT: Right breast DCIS.  PLAN:    1.  Right breast DCIS: Patient underwent lumpectomy on December 19, 2019 confirming diagnosis.  Previously, she declined enrollment in the COMET trial.  Patient completed adjuvant XRT on Mar 14, 2020.  Continue tamoxifen for total of 5 years completing treatment in May 2026.  Patient's next mammogram will be scheduled in January 2022.  Return to clinic in 6 months for routine evaluation.  2.  Genetics: Negative.   I spent a total of 20 minutes reviewing chart data, face-to-face evaluation with the patient, counseling and coordination of care as detailed above.   Patient expressed understanding and was in agreement with this plan. She also understands that She can call clinic at any time with any questions, concerns, or complaints.   Cancer Staging Ductal carcinoma in situ (DCIS) of right breast Staging form: Breast, AJCC 8th Edition - Clinical stage from 11/25/2019: Stage 0 (cTis (DCIS), cN0, cM0,  ER+, PR+, HER2-) -  Signed by Lloyd Huger, MD on 11/25/2019   Lloyd Huger, MD   09/25/2020 10:19 AM

## 2020-09-24 ENCOUNTER — Encounter: Payer: Self-pay | Admitting: Oncology

## 2020-09-24 ENCOUNTER — Inpatient Hospital Stay: Payer: BC Managed Care – PPO | Attending: Oncology | Admitting: Oncology

## 2020-09-24 VITALS — BP 128/87 | HR 92 | Temp 98.0°F | Wt 238.2 lb

## 2020-09-24 DIAGNOSIS — Z7984 Long term (current) use of oral hypoglycemic drugs: Secondary | ICD-10-CM | POA: Insufficient documentation

## 2020-09-24 DIAGNOSIS — Z8249 Family history of ischemic heart disease and other diseases of the circulatory system: Secondary | ICD-10-CM | POA: Diagnosis not present

## 2020-09-24 DIAGNOSIS — J45909 Unspecified asthma, uncomplicated: Secondary | ICD-10-CM | POA: Diagnosis not present

## 2020-09-24 DIAGNOSIS — Z170 Estrogen receptor positive status [ER+]: Secondary | ICD-10-CM | POA: Diagnosis not present

## 2020-09-24 DIAGNOSIS — D0511 Intraductal carcinoma in situ of right breast: Secondary | ICD-10-CM

## 2020-09-24 DIAGNOSIS — Z800 Family history of malignant neoplasm of digestive organs: Secondary | ICD-10-CM | POA: Diagnosis not present

## 2020-09-24 DIAGNOSIS — Z8049 Family history of malignant neoplasm of other genital organs: Secondary | ICD-10-CM | POA: Diagnosis not present

## 2020-09-24 DIAGNOSIS — E119 Type 2 diabetes mellitus without complications: Secondary | ICD-10-CM | POA: Insufficient documentation

## 2020-09-24 DIAGNOSIS — Z8349 Family history of other endocrine, nutritional and metabolic diseases: Secondary | ICD-10-CM | POA: Diagnosis not present

## 2020-09-24 DIAGNOSIS — Z803 Family history of malignant neoplasm of breast: Secondary | ICD-10-CM | POA: Insufficient documentation

## 2020-09-24 DIAGNOSIS — Z79899 Other long term (current) drug therapy: Secondary | ICD-10-CM | POA: Insufficient documentation

## 2020-09-24 DIAGNOSIS — Z8379 Family history of other diseases of the digestive system: Secondary | ICD-10-CM | POA: Diagnosis not present

## 2020-09-24 DIAGNOSIS — Z7951 Long term (current) use of inhaled steroids: Secondary | ICD-10-CM | POA: Insufficient documentation

## 2020-09-24 DIAGNOSIS — Z79810 Long term (current) use of selective estrogen receptor modulators (SERMs): Secondary | ICD-10-CM | POA: Insufficient documentation

## 2020-10-22 ENCOUNTER — Ambulatory Visit: Payer: BC Managed Care – PPO | Admitting: Radiation Oncology

## 2020-11-01 ENCOUNTER — Ambulatory Visit
Admission: RE | Admit: 2020-11-01 | Discharge: 2020-11-01 | Disposition: A | Discharge status: Home / Self Care | Payer: BC Managed Care – PPO | Source: Ambulatory Visit | Attending: Oncology | Admitting: Oncology

## 2020-11-01 ENCOUNTER — Other Ambulatory Visit: Payer: Self-pay

## 2020-11-01 DIAGNOSIS — D0511 Intraductal carcinoma in situ of right breast: Secondary | ICD-10-CM | POA: Diagnosis not present

## 2020-11-01 HISTORY — DX: Personal history of irradiation: Z92.3

## 2020-11-05 ENCOUNTER — Ambulatory Visit
Admission: RE | Admit: 2020-11-05 | Discharge: 2020-11-05 | Disposition: A | Discharge status: Home / Self Care | Payer: BC Managed Care – PPO | Source: Ambulatory Visit | Attending: Radiation Oncology | Admitting: Radiation Oncology

## 2020-11-05 ENCOUNTER — Encounter: Payer: Self-pay | Admitting: Radiation Oncology

## 2020-11-05 VITALS — BP 146/84 | HR 100 | Temp 97.8°F | Wt 241.0 lb

## 2020-11-05 DIAGNOSIS — D0511 Intraductal carcinoma in situ of right breast: Secondary | ICD-10-CM

## 2020-11-05 NOTE — Progress Notes (Signed)
Radiation Oncology Follow up Note  Name: Diane Cox   Date:   11/05/2020 MRN:  [id number] DOB: 11-Mar-1961    This 60 y.o. female presents to the clinic today for 38-month follow-up status post whole breast radiation to her right breast for ER positive ductal carcinoma in situ.  REFERRING PROVIDER: Jerl Mina, MD  HPI: Patient is a 61 year old female now out 6 months having completed whole breast radiation to her right breast for ER positive ductal carcinoma in situ seen today in routine follow-up she is doing well.  She specifically denies breast tenderness cough or bone pain.  She had a mammogram.  As well as ultrasound last week showing no evidence of new or recurrent breast cancer.  There is a 4.8 cm seroma in the lumpectomy bed.  She is currently on tamoxifen tolerating that well although she is having some hot flashes for which I suggested vitamin E supplements.  COMPLICATIONS OF TREATMENT: none  FOLLOW UP COMPLIANCE: keeps appointments   PHYSICAL EXAM:  BP (!) 146/84 (BP Location: Right Arm, Patient Position: Sitting, Cuff Size: Normal)   Pulse 100   Temp 97.8 F (36.6 C) (Tympanic)   Wt 241 lb (109.3 kg)   BMI 45.54 kg/m  Lungs are clear to A&P cardiac examination essentially unremarkable with regular rate and rhythm. No dominant mass or nodularity is noted in either breast in 2 positions examined. Incision is well-healed. No axillary or supraclavicular adenopathy is appreciated. Cosmetic result is excellent.  Well-developed well-nourished patient in NAD. HEENT reveals PERLA, EOMI, discs not visualized.  Oral cavity is clear. No oral mucosal lesions are identified. Neck is clear without evidence of cervical or supraclavicular adenopathy. Lungs are clear to A&P. Cardiac examination is essentially unremarkable with regular rate and rhythm without murmur rub or thrill. Abdomen is benign with no organomegaly or masses noted. Motor sensory and DTR levels are equal and symmetric in the  upper and lower extremities. Cranial nerves II through XII are grossly intact. Proprioception is intact. No peripheral adenopathy or edema is identified. No motor or sensory levels are noted. Crude visual fields are within normal range.  RADIOLOGY RESULTS: Mammograms reviewed as well as ultrasound compatible with above-stated findings  PLAN: Present time patient is doing well 6 months out from whole breast radiation and pleased with her overall progress.  She continues on tamoxifen and will add vitamin E supplements for her hot flashes.  I have asked to see her back in 6 months and then will start once year follow-up visits.  Patient knows to call with any concerns.  I would like to take this opportunity to thank you for allowing me to participate in the care of your patient.Carmina Miller, MD

## 2020-11-16 ENCOUNTER — Other Ambulatory Visit: Payer: Self-pay | Admitting: Oncology

## 2020-12-17 ENCOUNTER — Other Ambulatory Visit: Payer: Self-pay | Admitting: Oncology

## 2021-02-25 ENCOUNTER — Other Ambulatory Visit: Payer: Self-pay

## 2021-02-25 ENCOUNTER — Ambulatory Visit
Admission: EM | Admit: 2021-02-25 | Discharge: 2021-02-25 | Disposition: A | Discharge status: Home / Self Care | Payer: BC Managed Care – PPO | Attending: Physician Assistant | Admitting: Physician Assistant

## 2021-02-25 ENCOUNTER — Encounter: Payer: Self-pay | Admitting: Emergency Medicine

## 2021-02-25 DIAGNOSIS — J45909 Unspecified asthma, uncomplicated: Secondary | ICD-10-CM | POA: Insufficient documentation

## 2021-02-25 DIAGNOSIS — J069 Acute upper respiratory infection, unspecified: Secondary | ICD-10-CM | POA: Diagnosis not present

## 2021-02-25 DIAGNOSIS — U071 COVID-19: Secondary | ICD-10-CM | POA: Insufficient documentation

## 2021-02-25 DIAGNOSIS — R0981 Nasal congestion: Secondary | ICD-10-CM | POA: Diagnosis not present

## 2021-02-25 DIAGNOSIS — Z7951 Long term (current) use of inhaled steroids: Secondary | ICD-10-CM | POA: Insufficient documentation

## 2021-02-25 DIAGNOSIS — R059 Cough, unspecified: Secondary | ICD-10-CM | POA: Insufficient documentation

## 2021-02-25 LAB — SARS CORONAVIRUS 2 (TAT 6-24 HRS): SARS Coronavirus 2: POSITIVE — AB

## 2021-02-25 NOTE — ED Triage Notes (Signed)
Pt c/o nasal congestion, headache, body aches, subjective fever and cough. Started about 3 days ago. She has had covid vaccines.

## 2021-02-25 NOTE — ED Provider Notes (Signed)
[facility]    CSN: [id number] Arrival date & time: 02/25/21  0801      History   Chief Complaint Chief Complaint  Patient presents with  . Nasal Congestion  . Generalized Body Aches    HPI Diane Cox is a 60 y.o. female presenting for 3 day history of body aches, headaches, feeling feverish, cough and congestion.  Patient also missed a pressure in her ears.  She denies any recorded fevers.  Denies any chest pain or breathing difficulty.  No abdominal pain, nausea/vomiting or diarrhea.  Has been taking DayQuil and NyQuil for his symptoms with some relief.  Patient denies any know COVID or flu exposure and is fully vaccinated for COVID 19.  She says that she was around a sick coworker on Friday before she got sick.  She says that they were "coughing and sneezing everywhere."  PMH significant for asthma, OSA, diabetes, hypertension, hyperlipidemia, and GERD.  HPI  Past Medical History:  Diagnosis Date  . Anemia   . Arthritis   . Asthma    WELL CONTROLLED  . Cancer (HCC)    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 11/25/19  . Complication of anesthesia   . DDD (degenerative disc disease), cervical    AND SPINE  . Diabetes mellitus without complication (HCC)   . Elevated lipids   . Family history of breast cancer   . Family history of colon cancer   . Family history of uterine cancer   . Fatty liver    H/O  . GERD (gastroesophageal reflux disease)   . History of kidney stones    H/O  . IBS (irritable bowel syndrome)   . IBS (irritable bowel syndrome)   . Migraines   . Obesity   . Personal history of radiation therapy   . 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 Seasonal allergies   . Sleep apnea    USES CPAP    Patient Active Problem List   Diagnosis Date Noted  . Genetic testing 12/20/2019  . Family history of uterine cancer   . Family history of breast cancer   . Family history of colon cancer   . 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 Asthma without status asthmaticus 11/23/2019  . GERD (gastroesophageal reflux disease) 11/23/2019  . Hyperlipidemia 11/23/2019  . Hypertension 11/23/2019  . Lipoma of back 12/09/2018  . Acute bronchitis 11/05/2018  . Migraine with aura and without status migrainosus, not intractable 06/08/2018  . Myofascial pain syndrome, cervical 07/03/2017  . DDD (degenerative disc disease), lumbar 11/26/2016  . Arthritis 10/21/2016  . IBS (irritable bowel syndrome) 10/21/2016  . Morbid obesity with BMI of 40.0-44.9, adult (HCC) 08/14/2016    Past Surgical History:  Procedure Laterality Date  . ABDOMINAL HYSTERECTOMY    . BREAST BIOPSY Right 11/17/2019   affirm bx, coil marker, DUCTAL CARCINOMA IN SITU, INTERMEDIATE GRADE  . 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, clear marginsd  . CHOLECYSTECTOMY    . COLONOSCOPY WITH PROPOFOL N/A 11/25/2016   Procedure: COLONOSCOPY WITH PROPOFOL;  Surgeon: Christena Deem, MD;  Location: [facility] ENDOSCOPY;  Service: Endoscopy;  Laterality: N/A;  . ESOPHAGOGASTRODUODENOSCOPY (EGD) WITH PROPOFOL N/A 11/25/2016   Procedure: ESOPHAGOGASTRODUODENOSCOPY (EGD) WITH PROPOFOL;  Surgeon: Christena Deem, MD;  Location: [facility] ENDOSCOPY;  Service: Endoscopy;  Laterality: N/A;  . EXCISION OF ENDOMETRIOMA    . PARTIAL MASTECTOMY WITH NEEDLE LOCALIZATION Right 12/19/2019  Procedure: PARTIAL MASTECTOMY WITH NEEDLE LOCALIZATION;  Surgeon: Herbert Pun, MD;  Location: [facility] ORS;  Service: General;  Laterality: Right;  . TONSILLECTOMY      OB History   No obstetric history on file.      Home Medications    Prior to Admission medications   Medication Sig Start Date End Date Taking? Authorizing Provider  albuterol (PROVENTIL HFA;VENTOLIN HFA) 108 (90 BASE) MCG/ACT inhaler Inhale 4-6 puffs by mouth every 4 hours as needed for wheezing, cough, and/or shortness of breath Patient taking differently: Inhale 1-2 puffs into the  lungs every 4 (four) hours as needed (wheeze, cough and/or shortness of breath). 07/23/15  Yes Hinda Kehr, MD  BREO ELLIPTA 100-25 MCG/INH AEPB Inhale 1 puff into the lungs daily as needed (respiratory issues.).  05/22/19  Yes [provider]  cetirizine (ZYRTEC) 10 MG tablet Take 10 mg by mouth every evening.   Yes [provider]  Cholecalciferol (VITAMIN D-3) 125 MCG (5000 UT) TABS Take 5,000 Units by mouth at bedtime.   Yes [provider]  cyclobenzaprine (FLEXERIL) 5 MG tablet Take 5 mg by mouth at bedtime. 02/15/20  Yes [provider]  escitalopram (LEXAPRO) 10 MG tablet Take 10 mg by mouth every morning.    Yes [provider]  fluticasone (FLONASE) 50 MCG/ACT nasal spray Place 2 sprays into the nose daily as needed (allergies.).  05/22/17 02/25/21 Yes [provider]  gabapentin (NEURONTIN) 100 MG capsule Take 200 mg by mouth 2 (two) times daily as needed (arthritis pain.).    Yes [provider]  glipiZIDE (GLUCOTROL XL) 10 MG 24 hr tablet Take 10 tablets by mouth 2 (two) times daily. 12/21/19 02/25/21 Yes [provider]  hydrochlorothiazide (MICROZIDE) 12.5 MG capsule Take 12.5 mg by mouth daily.    Yes [provider]  hyoscyamine (LEVSIN) 0.125 MG tablet Take 0.125 mg by mouth every 6 (six) hours as needed. PT HAS NOT PICKED UP RX AS OF 12-14-19   Yes [provider]  Magnesium Oxide (MAG-200 PO) Take 200 mg by mouth at bedtime.   Yes [provider]  montelukast (SINGULAIR) 10 MG tablet Take 10 mg by mouth every morning.    Yes [provider]  pantoprazole (PROTONIX) 40 MG tablet Take 40 mg by mouth every morning.    Yes [provider]  Potassium 99 MG TABS Take 99 mg by mouth daily.    Yes [provider]  Probiotic Product (DIGESTIVE ADVANTAGE GUMMIES PO) Take 2 tablets by mouth every evening.   Yes [provider]  rosuvastatin (CRESTOR) 10 MG tablet  Take 10 mg by mouth daily. 04/12/20  Yes [provider]  sucralfate (CARAFATE) 1 G tablet Take 1 g by mouth 2 (two) times daily.    Yes [provider]  tamoxifen (NOLVADEX) 20 MG tablet Take 1 tablet by mouth once daily 12/17/20  Yes Finnegan, Kathlene November, MD  topiramate (TOPAMAX) 25 MG tablet Take 25 mg by mouth 2 (two) times daily. 05/23/19  Yes [provider]  benzonatate (TESSALON) 100 MG capsule Take 100 mg by mouth 3 (three) times daily as needed for cough.     [provider]  carisoprodol (SOMA) 350 MG tablet Take 350 mg by mouth daily as needed (severe neck pain/stiffiness).    [provider]  dicyclomine (BENTYL) 10 MG capsule Take 10 mg by mouth 3 (three) times daily as needed for spasms (IBS).     [provider]  HYDROcodone-acetaminophen (NORCO/VICODIN) 5-325 MG tablet Take 1 tablet by mouth every 4 (four) hours as needed. 12/19/19   [provider]  metFORMIN (GLUCOPHAGE-XR) 500 MG 24 hr tablet Take by mouth. 02/05/21   [provider]  Methylcellulose, Laxative, (CITRUCEL) 500 MG TABS Take 500 mg by mouth daily.    [provider]  NALTREXONE HCL PO Take by mouth.    [provider]  silver sulfADIAZINE (SILVADENE) 1 % cream Apply 1 application topically 2 (two) times daily. 03/02/20   Noreene Filbert, MD  vitamin E 180 MG (400 UNITS) capsule Take by mouth.    [provider]    Family History Family History  Problem Relation Age of Onset  . Heart attack Father   . Uterine cancer Mother        dx 25s  . Breast cancer Maternal Aunt        dx 31s, recurrence in 37s  . Uterine cancer Maternal Grandmother        dx 50s-60s  . Uterine cancer Sister        dx 21s  . Bone cancer Brother        dx 70s  . Colon cancer Maternal Aunt   . Stomach cancer Maternal Aunt   . Throat cancer Maternal Aunt   . Colon cancer Cousin     Social History Social History   Tobacco Use  . Smoking  status: Never Smoker  . Smokeless tobacco: Never Used  Vaping Use  . Vaping Use: Never used  Substance Use Topics  . Alcohol use: Yes    Alcohol/week: 0.0 standard drinks    Comment: occasional  . Drug use: No     Allergies   Chicken allergy, Compazine [prochlorperazine edisylate], Other, and Pork-derived products   Review of Systems Review of Systems  Constitutional: Negative for chills, diaphoresis, fatigue and fever.  HENT: Positive for congestion and rhinorrhea. Negative for ear pain, sinus pressure, sinus pain and sore throat.   Respiratory: Positive for cough. Negative for shortness of breath.   Gastrointestinal: Negative for abdominal pain, nausea and vomiting.  Musculoskeletal: Positive for myalgias. Negative for arthralgias.  Skin: Negative for rash.  Neurological: Positive for headaches. Negative for weakness.  Hematological: Negative for adenopathy.     Physical Exam Triage Vital Signs ED Triage Vitals [02/25/21 0817]  Enc Vitals Group     BP      Pulse      Resp      Temp      Temp src      SpO2      Weight 240 lb 15.4 oz (109.3 kg)     Height 5\' 1"  (1.549 m)     Head Circumference      Peak Flow      Pain Score 8     Pain Loc      Pain Edu?      Excl. in [place]?    No data found.  Updated Vital Signs BP (!) 148/87 (BP Location: Right Arm)   Pulse 85   Temp 98.3 F (36.8 C) (Oral)   Resp 18   Ht 5\' 1"  (1.549 m)   Wt 240 lb 15.4 oz (109.3 kg)   SpO2 99%   BMI 45.53 kg/m       Physical Exam Vitals and nursing note reviewed.  Constitutional:      General: She is not in acute distress.    Appearance: Normal appearance. She is not  ill-appearing or toxic-appearing.  HENT:     Head: Normocephalic and atraumatic.     Nose: Congestion and rhinorrhea present.     Mouth/Throat:     Mouth: Mucous membranes are moist.     Pharynx: Oropharynx is clear.  Eyes:     General: No scleral icterus.       Right eye: No discharge.        Left eye: No  discharge.     Conjunctiva/sclera: Conjunctivae normal.  Cardiovascular:     Rate and Rhythm: Normal rate and regular rhythm.     Heart sounds: Normal heart sounds.  Pulmonary:     Effort: Pulmonary effort is normal. No respiratory distress.     Breath sounds: Normal breath sounds.  Musculoskeletal:     Cervical back: Neck supple.  Skin:    General: Skin is dry.  Neurological:     General: No focal deficit present.     Mental Status: She is alert. Mental status is at baseline.     Motor: No weakness.     Gait: Gait normal.  Psychiatric:        Mood and Affect: Mood normal.        Behavior: Behavior normal.        Thought Content: Thought content normal.      UC Treatments / Results  Labs (all labs ordered are listed, but only abnormal results are displayed) Labs Reviewed  SARS CORONAVIRUS 2 (TAT 6-24 HRS)    EKG   Radiology No results found.  Procedures Procedures (including critical care time)  Medications Ordered in UC Medications - No data to display  Initial Impression / Assessment and Plan / UC Course  I have reviewed the triage vital signs and the nursing notes.  Pertinent labs & imaging results that were available during my care of the patient were reviewed by me and considered in my medical decision making (see chart for details).   61 year old female presenting for 3-day history of body aches, headaches, cough and congestion.  Patient is afebrile.  She is overall well-appearing.  Minor nasal congestion and clear rhinorrhea on exam.  Chest is clear to auscultation heart regular rate and rhythm.  COVID-19 test obtained.  Current CDC guidelines, isolation protocol and ED precautions reviewed with patient.  Advised likely viral illness.  Supportive care encouraged with increasing rest and fluids.  I did offer a different cough syrup but she states she is happy taking the DayQuil and NyQuil.  Advised her to use her at home asthma inhalers as she typically  does and especially the albuterol inhaler if she feels short of breath.  Encouraged her to return here or go to ED if she is having any acute shortness of breath or worsening symptoms.  Work note given. \ Final Clinical Impressions(s) / UC Diagnoses   Final diagnoses:  Viral upper respiratory tract infection  Nasal congestion  Cough     Discharge Instructions     URI/COLD SYMPTOMS: Your exam today is consistent with a viral illness. Antibiotics are not indicated at this time. Use medications as directed, including cough syrup, nasal saline, and decongestants. Your symptoms should improve over the next few days and resolve within 7-10 days. Increase rest and fluids. F/u if symptoms worsen or predominate such as sore throat, ear pain, productive cough, shortness of breath, or if you develop high fevers or worsening fatigue over the next several days.    You have received COVID testing today either for  positive exposure, concerning symptoms that could be related to COVID infection, screening purposes, or re-testing after confirmed positive.  Your test obtained today checks for active viral infection in the last 1-2 weeks. If your test is negative now, you can still test positive later. So, if you do develop symptoms you should either get re-tested and/or isolate x 5 days and then strict mask use x 5 days (unvaccinated) or mask use x 10 days (vaccinated). Please follow CDC guidelines.  While Rapid antigen tests come back in 15-20 minutes, send out PCR/molecular test results typically come back within 1-3 days. In the mean time, if you are symptomatic, assume this could be a positive test and treat/monitor yourself as if you do have COVID.   We will call with test results if positive. Please download the MyChart app and set up a profile to access test results.   If symptomatic, go home and rest. Push fluids. Take Tylenol as needed for discomfort. Gargle warm salt water. Throat lozenges. Take Mucinex  DM or Robitussin for cough. Humidifier in bedroom to ease coughing. Warm showers. Also review the COVID handout for more information.  COVID-19 INFECTION: The incubation period of COVID-19 is approximately 14 days after exposure, with most symptoms developing in roughly 4-5 days. Symptoms may range in severity from mild to critically severe. Roughly 80% of those infected will have mild symptoms. People of any age may become infected with COVID-19 and have the ability to transmit the virus. The most common symptoms include: fever, fatigue, cough, body aches, headaches, sore throat, nasal congestion, shortness of breath, nausea, vomiting, diarrhea, changes in smell and/or taste.    COURSE OF ILLNESS Some patients may begin with mild disease which can progress quickly into critical symptoms. If your symptoms are worsening please call ahead to the Emergency Department and proceed there for further treatment. Recovery time appears to be roughly 1-2 weeks for mild symptoms and 3-6 weeks for severe disease.   GO IMMEDIATELY TO ER FOR FEVER YOU ARE UNABLE TO GET DOWN WITH TYLENOL, BREATHING PROBLEMS, CHEST PAIN, FATIGUE, LETHARGY, INABILITY TO EAT OR DRINK, ETC  QUARANTINE AND ISOLATION: To help decrease the spread of COVID-19 please remain isolated if you have COVID infection or are highly suspected to have COVID infection. This means -stay home and isolate to one room in the home if you live with others. Do not share a bed or bathroom with others while ill, sanitize and wipe down all countertops and keep common areas clean and disinfected. Stay home for 5 days. If you have no symptoms or your symptoms are resolving after 5 days, you can leave your house. Continue to wear a mask around others for 5 additional days. If you have been in close contact (within 6 feet) of someone diagnosed with COVID 19, you are advised to quarantine in your home for 14 days as symptoms can develop anywhere from 2-14 days after  exposure to the virus. If you develop symptoms, you  must isolate.  Most current guidelines for COVID after exposure -unvaccinated: isolate 5 days and strict mask use x 5 days. Test on day 5 is possible -vaccinated: wear mask x 10 days if symptoms do not develop -You do not necessarily need to be tested for COVID if you have + exposure and  develop symptoms. Just isolate at home x10 days from symptom onset During this global pandemic, CDC advises to practice social distancing, try to stay at least 27ft away from others at all times.  Wear a face covering. Wash and sanitize your hands regularly and avoid going anywhere that is not necessary.  KEEP IN MIND THAT THE COVID TEST IS NOT 100% ACCURATE AND YOU SHOULD STILL DO EVERYTHING TO PREVENT POTENTIAL SPREAD OF VIRUS TO OTHERS (WEAR MASK, WEAR GLOVES, Box HANDS AND SANITIZE REGULARLY). IF INITIAL TEST IS NEGATIVE, THIS MAY NOT MEAN YOU ARE DEFINITELY NEGATIVE. MOST ACCURATE TESTING IS DONE 5-7 DAYS AFTER EXPOSURE.   It is not advised by CDC to get re-tested after receiving a positive COVID test since you can still test positive for weeks to months after you have already cleared the virus.   *If you have not been vaccinated for COVID, I strongly suggest you consider getting vaccinated as long as there are no contraindications.      ED Prescriptions    None     PDMP not reviewed this encounter.   Danton Clap, PA-C 02/25/21 [phone]

## 2021-02-25 NOTE — Discharge Instructions (Addendum)

## 2021-02-26 ENCOUNTER — Telehealth: Payer: Self-pay

## 2021-02-26 ENCOUNTER — Other Ambulatory Visit: Payer: Self-pay | Admitting: Adult Health

## 2021-02-26 ENCOUNTER — Encounter: Payer: Self-pay | Admitting: Adult Health

## 2021-02-26 ENCOUNTER — Other Ambulatory Visit: Payer: Self-pay

## 2021-02-26 DIAGNOSIS — U071 COVID-19: Secondary | ICD-10-CM

## 2021-02-26 MED ORDER — NIRMATRELVIR/RITONAVIR (PAXLOVID)TABLET
3.0000 | ORAL_TABLET | Freq: Two times a day (BID) | ORAL | 0 refills | Status: AC
  Filled 2021-02-26: qty 30, 5d supply, fill #0

## 2021-02-26 NOTE — Progress Notes (Signed)
Outpatient Oral COVID Treatment Note  I connected with Diane Cox on 02/26/2021/9:38 AM by telephone and verified that I am speaking with the correct person using two identifiers.  I discussed the limitations, risks, security, and privacy concerns of performing an evaluation and management service by telephone and the availability of in person appointments. I also discussed with the patient that there may be a patient responsible charge related to this service. The patient expressed understanding and agreed to proceed.  Patient location: home Provider location: home  Diagnosis: COVID-19 infection  Purpose of visit: Discussion of potential use of Molnupiravir or Paxlovid, a new treatment for mild to moderate COVID-19 viral infection in non-hospitalized patients.   Subjective: Patient is a 60 y.o. female who has been diagnosed with COVID 19 viral infection.  Their symptoms began on 02/23/2021 with cough/nasal drainage.    Past Medical History:  Diagnosis Date  . Anemia   . Arthritis   . Asthma    WELL CONTROLLED  . Cancer ([place])    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 11/25/19  . Complication of anesthesia   . DDD (degenerative disc disease), cervical    AND SPINE  . Diabetes mellitus without complication ([place])   . Elevated lipids   . Family history of breast cancer   . Family history of colon cancer   . Family history of uterine cancer   . Fatty liver    H/O  . GERD (gastroesophageal reflux disease)   . History of kidney stones    H/O  . IBS (irritable bowel syndrome)   . IBS (irritable bowel syndrome)   . Migraines   . Obesity   . Personal history of radiation therapy   . 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 Seasonal allergies   . Sleep apnea    USES CPAP    Allergies  Allergen Reactions  . Chicken Allergy Rash  . Compazine [Prochlorperazine Edisylate] Nausea And Vomiting and Anxiety  . Other Rash    BLOOD THINNERS-THE COMPONENT IN BLOOD  THINNERS CAUSE A RASH AT INJECTION SITE BLOOD THINNERS-THE COMPONENT IN BLOOD THINNERS CAUSE A RASH AT INJECTION SITE  . Pork-Derived Products Rash     Current Outpatient Medications:  .  albuterol (PROVENTIL HFA;VENTOLIN HFA) 108 (90 BASE) MCG/ACT inhaler, Inhale 4-6 puffs by mouth every 4 hours as needed for wheezing, cough, and/or shortness of breath (Patient taking differently: Inhale 1-2 puffs into the lungs every 4 (four) hours as needed (wheeze, cough and/or shortness of breath).), Disp: 1 Inhaler, Rfl: 1 .  BREO ELLIPTA 100-25 MCG/INH AEPB, Inhale 1 puff into the lungs daily as needed (respiratory issues.). , Disp: , Rfl:  .  cetirizine (ZYRTEC) 10 MG tablet, Take 10 mg by mouth every evening., Disp: , Rfl:  .  Cholecalciferol (VITAMIN D-3) 125 MCG (5000 UT) TABS, Take 5,000 Units by mouth at bedtime., Disp: , Rfl:  .  cyclobenzaprine (FLEXERIL) 5 MG tablet, Take 5 mg by mouth at bedtime., Disp: , Rfl:  .  dicyclomine (BENTYL) 10 MG capsule, Take 10 mg by mouth 3 (three) times daily as needed for spasms (IBS). , Disp: , Rfl:  .  escitalopram (LEXAPRO) 10 MG tablet, Take 10 mg by mouth every morning. , Disp: , Rfl:  .  fluticasone (FLONASE) 50 MCG/ACT nasal spray, Place 2 sprays into the nose daily as needed (allergies.). , Disp: , Rfl:  .  gabapentin (NEURONTIN) 100 MG capsule, Take 200  mg by mouth 2 (two) times daily as needed (arthritis pain.). , Disp: , Rfl:  .  glipiZIDE (GLUCOTROL XL) 10 MG 24 hr tablet, Take 10 tablets by mouth 2 (two) times daily., Disp: , Rfl:  .  hydrochlorothiazide (MICROZIDE) 12.5 MG capsule, Take 12.5 mg by mouth daily. , Disp: , Rfl:  .  HYDROcodone-acetaminophen (NORCO/VICODIN) 5-325 MG tablet, Take 1 tablet by mouth every 4 (four) hours as needed., Disp: , Rfl:  .  hyoscyamine (LEVSIN) 0.125 MG tablet, Take 0.125 mg by mouth every 6 (six) hours as needed. PT HAS NOT PICKED UP RX AS OF 12-14-19, Disp: , Rfl:  .  Magnesium Oxide (MAG-200 PO), Take 200 mg by  mouth at bedtime., Disp: , Rfl:  .  metFORMIN (GLUCOPHAGE-XR) 500 MG 24 hr tablet, Take by mouth., Disp: , Rfl:  .  Methylcellulose, Laxative, (CITRUCEL) 500 MG TABS, Take 500 mg by mouth daily., Disp: , Rfl:  .  montelukast (SINGULAIR) 10 MG tablet, Take 10 mg by mouth every morning. , Disp: , Rfl:  .  NALTREXONE HCL PO, Take by mouth., Disp: , Rfl:  .  pantoprazole (PROTONIX) 40 MG tablet, Take 40 mg by mouth every morning. , Disp: , Rfl:  .  Potassium 99 MG TABS, Take 99 mg by mouth daily. , Disp: , Rfl:  .  Probiotic Product (DIGESTIVE ADVANTAGE GUMMIES PO), Take 2 tablets by mouth every evening., Disp: , Rfl:  .  rosuvastatin (CRESTOR) 10 MG tablet, Take 10 mg by mouth daily., Disp: , Rfl:  .  silver sulfADIAZINE (SILVADENE) 1 % cream, Apply 1 application topically 2 (two) times daily., Disp: 50 g, Rfl: 2 .  sucralfate (CARAFATE) 1 G tablet, Take 1 g by mouth 2 (two) times daily. , Disp: , Rfl:  .  tamoxifen (NOLVADEX) 20 MG tablet, Take 1 tablet by mouth once daily, Disp: 90 tablet, Rfl: 3 .  topiramate (TOPAMAX) 25 MG tablet, Take 25 mg by mouth 2 (two) times daily., Disp: , Rfl:  .  vitamin E 180 MG (400 UNITS) capsule, Take by mouth., Disp: , Rfl:   Objective: Patient appears/sounds slightly tired.  They are in no apparent distress.  Breathing is non labored.  Mood and behavior are normal.  Laboratory Data:  Recent Results (from the past 2160 hour(s))  SARS CORONAVIRUS 2 (TAT 6-24 HRS) Nasopharyngeal Nasopharyngeal Swab     Status: Abnormal   Collection Time: 02/25/21  8:23 AM   Specimen: Nasopharyngeal Swab  Result Value Ref Range   SARS Coronavirus 2 POSITIVE (A) NEGATIVE    Comment: (NOTE) SARS-CoV-2 target nucleic acids are DETECTED.  The SARS-CoV-2 RNA is generally detectable in upper and lower respiratory specimens during the acute phase of infection. Positive results are indicative of the presence of SARS-CoV-2 RNA. Clinical correlation with patient history and other  diagnostic information is  necessary to determine patient infection status. Positive results do not rule out bacterial infection or co-infection with other viruses.  The expected result is Negative.  Fact Sheet for Patients: SugarRoll.be  Fact Sheet for Healthcare Providers: https://www.woods-mathews.com/  This test is not yet approved or cleared by the [place] FDA and  has been authorized for detection and/or diagnosis of SARS-CoV-2 by FDA under an Emergency Use Authorization (EUA). This EUA will remain  in effect (meaning this test can be used) for the duration of the COVID-19 declaration under Section 564(b)(1) of the Act, 21 U. S.C. section 360bbb-3(b)(1), unless the authorization is terminated or revoked  sooner.   Performed at [facility], [place] [address], [place], [place] [address]      Assessment: 60 y.o. female with mild/moderate COVID 19 viral infection diagnosed on 02/25/2021 at high risk for progression to severe COVID 19.  Plan:  This patient is a 60 y.o. female that meets the following criteria for Emergency Use Authorization of: Paxlovid 1. Age >12 yr AND > 40 kg 2. SARS-COV-2 positive test 3. Symptom onset < 5 days 4. Mild-to-moderate COVID disease with high risk for severe progression to hospitalization or death  I have spoken and communicated the following to the patient or parent/caregiver regarding: 1. Paxlovid is an unapproved drug that is authorized for use under an Emergency Use Authorization.  2. There are no adequate, approved, available products for the treatment of COVID-19 in adults who have mild-to-moderate COVID-19 and are at high risk for progressing to severe COVID-19, including hospitalization or death. 3. Other therapeutics are currently authorized. For additional information on all products authorized for treatment or prevention of COVID-19, please see  TanEmporium.pl.  4. There are benefits and risks of taking this treatment as outlined in the "Fact Sheet for Patients and Caregivers."  5. "Fact Sheet for Patients and Caregivers" was reviewed with patient. A hard copy will be provided to patient from pharmacy prior to the patient receiving treatment. 6. Patients should continue to self-isolate and use infection control measures (e.g., wear mask, isolate, social distance, avoid sharing personal items, clean and disinfect "high touch" surfaces, and frequent handwashing) according to CDC guidelines.  7. The patient or parent/caregiver has the option to accept or refuse treatment. 8. Patient medication history was reviewed for potential drug interactions:Interaction with home meds: Flonase, Breo, Crestor, patient is holding these therapies starting today through 24 hours after her final Paxlovid dose 9. Patient's GFR was calculated to be 74, and they were therefore prescribed Normal dose (GFR>60) - nirmatrelvir 150mg  tab (2 tablet) by mouth twice daily AND ritonavir 100mg  tab (1 tablet) by mouth twice daily   After reviewing above information with the patient, the patient agrees to receive Paxlovid.  Follow up instructions:    . Take prescription BID x 5 days as directed . Reach out to pharmacist for counseling on medication if desired . For concerns regarding further COVID symptoms please follow up with your PCP or urgent care . For urgent or life-threatening issues, seek care at your local emergency department  The patient was provided an opportunity to ask questions, and all were answered. The patient agreed with the plan and demonstrated an understanding of the instructions.   Script sent to [facility] and opted to pick up RX.  The patient was advised to call their PCP or seek an in-person evaluation if the symptoms worsen  or if the condition fails to improve as anticipated.   I provided 15 minutes of non face-to-face telephone visit time during this encounter, and > 50% was spent counseling as documented under my assessment & plan.  Scot Dock, NP 02/26/2021 /9:38 AM

## 2021-02-26 NOTE — Telephone Encounter (Signed)
Patient was prescribed oral covid treatment Paxlovid and treatment note was reviewed. Medication has been received by [place] and reviewed for appropriateness.  Drug Interactions or Dosage Adjustments Noted: Patient was instructed to hold Flonase, Breo inhaler, and Crestor while taking Paxlovid.   Delivery Method: Pick-up  Patient contacted for counseling on 02/26/21 and verbalized understanding.   Delivery or Pick-Up Date: 02/26/21   Carolynne Edouard 02/26/2021, 10:34 AM [place] Outpatient Pharmacist Phone# [phone]

## 2021-03-25 ENCOUNTER — Other Ambulatory Visit: Payer: Self-pay

## 2021-03-25 ENCOUNTER — Inpatient Hospital Stay: Payer: BC Managed Care – PPO | Attending: Oncology | Admitting: Oncology

## 2021-03-25 ENCOUNTER — Encounter: Payer: Self-pay | Admitting: Oncology

## 2021-03-25 VITALS — BP 119/76 | HR 94 | Temp 98.3°F | Resp 18 | Ht 61.0 in | Wt 232.5 lb

## 2021-03-25 DIAGNOSIS — Z923 Personal history of irradiation: Secondary | ICD-10-CM | POA: Diagnosis not present

## 2021-03-25 DIAGNOSIS — Z79899 Other long term (current) drug therapy: Secondary | ICD-10-CM | POA: Diagnosis not present

## 2021-03-25 DIAGNOSIS — Z79810 Long term (current) use of selective estrogen receptor modulators (SERMs): Secondary | ICD-10-CM | POA: Insufficient documentation

## 2021-03-25 DIAGNOSIS — Z8616 Personal history of COVID-19: Secondary | ICD-10-CM | POA: Insufficient documentation

## 2021-03-25 DIAGNOSIS — D0511 Intraductal carcinoma in situ of right breast: Secondary | ICD-10-CM | POA: Diagnosis not present

## 2021-03-25 DIAGNOSIS — Z7951 Long term (current) use of inhaled steroids: Secondary | ICD-10-CM | POA: Insufficient documentation

## 2021-03-25 DIAGNOSIS — Z7984 Long term (current) use of oral hypoglycemic drugs: Secondary | ICD-10-CM | POA: Insufficient documentation

## 2021-03-25 NOTE — Progress Notes (Signed)
Diane Cox  [phone] [phone]  ID: Terese Door OB: April 15, 1961  MR#: [id number]  MGN#:[id number]  Patient Care Team: [name], MD as PCP - General (Family Medicine) Theodore Demark, RN as Registered Nurse (Oncology) Rico Junker, RN as Registered Nurse (Oncology) Herbert Pun, MD as Consulting Physician (General Surgery) Noreene Filbert, MD as Referring Physician (Radiation Oncology) Lloyd Huger, MD as Consulting Physician (Oncology)  CHIEF COMPLAINT: Right breast DCIS  INTERVAL HISTORY: Patient returns to clinic today for routine 1-monthevaluation. She was last seen on 09/24/20.  She is on tamoxifen.  She was seen by Dr. CDonella Stadeon 11/05/2020 for follow-up.  She completed whole breast radiation (03/14/20).  Most recent mammogram and ultrasound (11/01/20) was negative for recurrence.  Evidence of a 4.8 cm seroma in the lumpectomy bed.  Reported hot flashes.  Recommended vitamin E.  In the interim she was diagnosed with Covid (02/25/21). She received Paxlovid X 5 days.   Reports improvement of her hot flashes with the use of vitamin E.  Had 1 episode of vaginal bleeding a few months ago.  Has a scheduled Pap smear in July.  Reports fatigue and weakness since COVID.  She would like a referral to the care program.  Prior to COVID, was receiving weekly massages which she is hoping to restart this week.  Continues her tamoxifen.  Reports episodes of breast tenderness at seroma site occasionally.  Overall stable.  Denies any additional symptoms.   REVIEW OF SYSTEMS:   Review of Systems  Constitutional: Positive for malaise/fatigue. Negative for fever and weight loss.  Respiratory: Positive for shortness of breath. Negative for cough and hemoptysis.   Cardiovascular: Negative.  Negative for chest pain and leg swelling.  Gastrointestinal: Negative.  Negative for abdominal pain.  Genitourinary: Negative.  Negative for  dysuria.       1 episode vaginal bleeding  Musculoskeletal: Positive for joint pain. Negative for back pain.  Skin: Negative.  Negative for rash.  Neurological: Positive for weakness. Negative for dizziness, focal weakness and headaches.  Psychiatric/Behavioral: Negative.  The patient is not nervous/anxious.     As per HPI. Otherwise, a complete review of systems is negative.  PAST MEDICAL HISTORY: Past Medical History:  Diagnosis Date  . Anemia   . Arthritis   . Asthma    WELL CONTROLLED  . 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 Complication of anesthesia   . DDD (degenerative disc disease), cervical    AND SPINE  . [place]   . Elevated lipids   . Family history of breast cancer   . Family history of colon cancer   . Family history of uterine cancer   . Fatty liver    H/O  . GERD (gastroesophageal reflux disease)   . History of kidney stones    H/O  . IBS (irritable bowel syndrome)   . IBS (irritable bowel syndrome)   . Migraines   . Obesity   . Personal history of radiation therapy   . 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 Seasonal allergies   . Sleep apnea    USES CPAP    PAST SURGICAL HISTORY: Past Surgical History:  Procedure Laterality Date  . ABDOMINAL HYSTERECTOMY    . BREAST BIOPSY Right 11/17/2019   affirm bx, coil marker, DUCTAL CARCINOMA IN SITU, INTERMEDIATE GRADE  . BREAST LUMPECTOMY Right 12/19/2019  DCIS, clear marginsd  . CHOLECYSTECTOMY    . COLONOSCOPY WITH PROPOFOL N/A 11/25/2016   Procedure: COLONOSCOPY WITH PROPOFOL;  Surgeon: Lollie Sails, MD;  Location: [facility] ENDOSCOPY;  Service: Endoscopy;  Laterality: N/A;  . ESOPHAGOGASTRODUODENOSCOPY (EGD) WITH PROPOFOL N/A 11/25/2016   Procedure: ESOPHAGOGASTRODUODENOSCOPY (EGD) WITH PROPOFOL;  Surgeon: Lollie Sails, MD;  Location: [facility]COPY;  Service: Endoscopy;  Laterality: N/A;  . EXCISION OF  ENDOMETRIOMA    . PARTIAL MASTECTOMY WITH NEEDLE LOCALIZATION Right 12/19/2019   Procedure: PARTIAL MASTECTOMY WITH NEEDLE LOCALIZATION;  Surgeon: Herbert Pun, MD;  Location: [facility] ORS;  Service: General;  Laterality: Right;  . TONSILLECTOMY      FAMILY HISTORY: Family History  Problem Relation Age of Onset  . Heart attack Father   . Uterine cancer Mother        dx 66s  . Breast cancer Maternal Aunt        dx 58s, recurrence in 80s  . Uterine cancer Maternal Grandmother        dx 50s-60s  . Uterine cancer Sister        dx 27s  . Bone cancer Brother        dx 65s  . Colon cancer Maternal Aunt   . Stomach cancer Maternal Aunt   . Throat cancer Maternal Aunt   . Colon cancer Cousin     ADVANCED DIRECTIVES (Y/N):  N  HEALTH MAINTENANCE: Social History   Tobacco Use  . Smoking status: Never Smoker  . Smokeless tobacco: Never Used  Vaping Use  . Vaping Use: Never used  Substance Use Topics  . Alcohol use: Yes    Alcohol/week: 0.0 standard drinks    Comment: occasional  . Drug use: No     Colonoscopy:  PAP:  Bone density:  Lipid panel:  Allergies  Allergen Reactions  . Chicken Allergy Rash  . Compazine [Prochlorperazine Edisylate] Nausea And Vomiting and Anxiety  . Other Rash    BLOOD THINNERS-THE COMPONENT IN BLOOD THINNERS CAUSE A RASH AT INJECTION SITE BLOOD THINNERS-THE COMPONENT IN BLOOD THINNERS CAUSE A RASH AT INJECTION SITE  . Pork-Derived Products Rash    Current Outpatient Medications  Medication Sig Dispense Refill  . albuterol (PROVENTIL HFA;VENTOLIN HFA) 108 (90 BASE) MCG/ACT inhaler Inhale 4-6 puffs by mouth every 4 hours as needed for wheezing, cough, and/or shortness of breath (Patient taking differently: Inhale 1-2 puffs into the lungs every 4 (four) hours as needed (wheeze, cough and/or shortness of breath).) 1 Inhaler 1  . BREO ELLIPTA 100-25 MCG/INH AEPB Inhale 1 puff into the lungs daily as needed (respiratory issues.).     Marland Kitchen  cetirizine (ZYRTEC) 10 MG tablet Take 10 mg by mouth every evening.    . Cholecalciferol (VITAMIN D-3) 125 MCG (5000 UT) TABS Take 5,000 Units by mouth at bedtime.    . cyclobenzaprine (FLEXERIL) 5 MG tablet Take 5 mg by mouth at bedtime.    Marland Kitchen escitalopram (LEXAPRO) 10 MG tablet Take 10 mg by mouth every morning.     . gabapentin (NEURONTIN) 100 MG capsule Take 200 mg by mouth 2 (two) times daily as needed (arthritis pain.).     Marland Kitchen hydrochlorothiazide (MICROZIDE) 12.5 MG capsule Take 12.5 mg by mouth daily.     . hyoscyamine (LEVSIN) 0.125 MG tablet Take 0.125 mg by mouth every 6 (six) hours as needed. PT HAS NOT PICKED UP RX AS OF 12-14-19    . Magnesium Oxide (MAG-200 PO) Take 200 mg by mouth at  bedtime.    . metFORMIN (GLUCOPHAGE-XR) 500 MG 24 hr tablet Take by mouth.    . Methylcellulose, Laxative, (CITRUCEL) 500 MG TABS Take 500 mg by mouth daily.    . montelukast (SINGULAIR) 10 MG tablet Take 10 mg by mouth every morning.     Marland Kitchen NALTREXONE HCL PO Take by mouth.    . pantoprazole (PROTONIX) 40 MG tablet Take 40 mg by mouth every morning.     . Potassium 99 MG TABS Take 99 mg by mouth daily.     . Probiotic Product (DIGESTIVE ADVANTAGE GUMMIES PO) Take 2 tablets by mouth every evening.    . rosuvastatin (CRESTOR) 10 MG tablet Take 10 mg by mouth daily.    . sucralfate (CARAFATE) 1 G tablet Take 1 g by mouth 2 (two) times daily.     . tamoxifen (NOLVADEX) 20 MG tablet Take 1 tablet by mouth once daily 90 tablet 3  . topiramate (TOPAMAX) 25 MG tablet Take 25 mg by mouth 2 (two) times daily.    . vitamin E 180 MG (400 UNITS) capsule Take by mouth.    . dicyclomine (BENTYL) 10 MG capsule Take 10 mg by mouth 3 (three) times daily as needed for spasms (IBS).  (Patient not taking: Reported on 03/25/2021)    . fluticasone (FLONASE) 50 MCG/ACT nasal spray Place 2 sprays into the nose daily as needed (allergies.).     Marland Kitchen glipiZIDE (GLUCOTROL XL) 10 MG 24 hr tablet Take 10 tablets by mouth 2 (two) times  daily.    Marland Kitchen HYDROcodone-acetaminophen (NORCO/VICODIN) 5-325 MG tablet Take 1 tablet by mouth every 4 (four) hours as needed. (Patient not taking: Reported on 03/25/2021)    . silver sulfADIAZINE (SILVADENE) 1 % cream Apply 1 application topically 2 (two) times daily. (Patient not taking: Reported on 03/25/2021) 50 g 2   No current facility-administered medications for this visit.    OBJECTIVE: Vitals:   03/25/21 1106  BP: 119/76  Pulse: 94  Resp: 18  Temp: 98.3 F (36.8 C)  SpO2: 97%     Body mass index is 43.93 kg/m.    ECOG FS:0 - Asymptomatic  Physical Exam Constitutional:      Appearance: Normal appearance. She is obese.  HENT:     Head: Normocephalic and atraumatic.  Eyes:     Pupils: Pupils are equal, round, and reactive to light.  Cardiovascular:     Rate and Rhythm: Normal rate and regular rhythm.     Heart sounds: Normal heart sounds. No murmur heard.   Pulmonary:     Effort: Pulmonary effort is normal.     Breath sounds: Normal breath sounds. No wheezing.  Abdominal:     General: Bowel sounds are normal. There is no distension.     Palpations: Abdomen is soft.     Tenderness: There is no abdominal tenderness.  Musculoskeletal:        General: Normal range of motion.     Cervical back: Normal range of motion.  Skin:    General: Skin is warm and dry.     Findings: No rash.  Neurological:     Mental Status: She is alert and oriented to person, place, and time.  Psychiatric:        Judgment: Judgment normal.      LAB RESULTS:  Lab Results  Component Value Date   NA 136 06/17/2019   K 3.3 (L) 06/17/2019   CL 97 (L) 06/17/2019   CO2 28 06/17/2019  GLUCOSE 256 (H) 06/17/2019   BUN 17 06/17/2019   CREATININE 0.66 06/17/2019   CALCIUM 9.0 06/17/2019   PROT 7.7 06/17/2019   ALBUMIN 4.1 06/17/2019   AST 18 06/17/2019   ALT 25 06/17/2019   ALKPHOS 136 (H) 06/17/2019   BILITOT 0.2 (L) 06/17/2019   GFRNONAA >60 06/17/2019   GFRAA >60 06/17/2019     Lab Results  Component Value Date   WBC 9.6 03/06/2020   HGB 13.5 03/06/2020   HCT 41.6 03/06/2020   MCV 90.2 03/06/2020   PLT 338 03/06/2020     STUDIES: No results found.  ASSESSMENT: Right breast DCIS.  PLAN:    1.  Right breast DCIS:  -Status postlumpectomy (12/19/2019). - Completed XRT (03/14/2020) -Started on tamoxifen (May 2021)-we will complete in May 2026 -Most recent mammogram from 11/01/2020 was negative for recurrence. -Repeat in December 2022.  Will place orders at next visit. -RTC in 6 months for follow-up  2.  Hot flashes: -Secondary to tamoxifen. -Improved with vitamin E.   3.  Deconditioning: -Secondary to COVID and obesity. - Referral to care program.  4.  Vaginal bleeding: -1 episode several months back. -Has scheduled Pap smear in July.  Disposition: -RTC in 6 months with MD assessment.  Greater than 50% was spent in counseling and coordination of care with this patient including but not limited to discussion of the relevant topics above (See A&P) including, but not limited to diagnosis and management of acute and chronic medical conditions.   Patient expressed understanding and was in agreement with this plan. She also understands that She can call clinic at any time with any questions, concerns, or complaints.   Cancer Staging Ductal carcinoma in situ (DCIS) of right breast Staging form: Breast, AJCC 8th Edition - Clinical stage from 11/25/2019: Stage 0 (cTis (DCIS), cN0, cM0, ER+, PR+, HER2-) - Signed by Lloyd Huger, MD on 11/25/2019 Stage prefix: Initial diagnosis Nuclear grade: Brandon Melnick, NP   03/25/2021 11:53 AM

## 2021-03-25 NOTE — Progress Notes (Signed)
Just getting over covid  A month ago and still does not fell right her appetite has still not returned and still feel weak and her breathing is still not normal and gets shaky

## 2021-05-08 ENCOUNTER — Ambulatory Visit
Admission: RE | Admit: 2021-05-08 | Discharge: 2021-05-08 | Disposition: A | Discharge status: Home / Self Care | Payer: BC Managed Care – PPO | Source: Ambulatory Visit | Attending: Radiation Oncology | Admitting: Radiation Oncology

## 2021-05-08 ENCOUNTER — Encounter: Payer: Self-pay | Admitting: Radiation Oncology

## 2021-05-08 VITALS — BP 122/96 | HR 100 | Temp 98.8°F | Resp 18 | Wt 232.0 lb

## 2021-05-08 DIAGNOSIS — Z170 Estrogen receptor positive status [ER+]: Secondary | ICD-10-CM | POA: Insufficient documentation

## 2021-05-08 DIAGNOSIS — D0511 Intraductal carcinoma in situ of right breast: Secondary | ICD-10-CM | POA: Diagnosis not present

## 2021-05-08 DIAGNOSIS — Z923 Personal history of irradiation: Secondary | ICD-10-CM | POA: Diagnosis not present

## 2021-05-08 DIAGNOSIS — Z79810 Long term (current) use of selective estrogen receptor modulators (SERMs): Secondary | ICD-10-CM | POA: Insufficient documentation

## 2021-05-08 NOTE — Progress Notes (Signed)
Radiation Oncology Follow up Note  Name: Diane Cox   Date:   05/08/2021 MRN:  [id number] DOB: Jan 14, 1961    This 60 y.o. 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  REFERRING PROVIDER: [name], MD  HPI: Patient is a 60 year old female now about 1 year having completed whole breast radiation to her right breast for ER positive ductal carcinoma in situ.  Seen today in routine follow-up she is doing well.  She specifically denies breast tenderness cough or bone pain.  She has been having some spotting she is status post hysterectomy is seeing her GYN for that.  She is currently on tamoxifen otherwise tolerating that well..  She had mammograms and ultrasound back in December which I have reviewed were BI-RADS 2 benign.  COMPLICATIONS OF TREATMENT: none  FOLLOW UP COMPLIANCE: keeps appointments   PHYSICAL EXAM:  BP (!) 122/96 (BP Location: Left Arm, Patient Position: Sitting)   Pulse 100   Temp 98.8 F (37.1 C) (Tympanic)   Resp 18   Wt 232 lb (105.2 kg)   BMI 43.84 kg/m  Lungs are clear to A&P cardiac examination essentially unremarkable with regular rate and rhythm. No dominant mass or nodularity is noted in either breast in 2 positions examined. Incision is well-healed. No axillary or supraclavicular adenopathy is appreciated. Cosmetic result is excellent.  Well-developed well-nourished patient in NAD. HEENT reveals PERLA, EOMI, discs not visualized.  Oral cavity is clear. No oral mucosal lesions are identified. Neck is clear without evidence of cervical or supraclavicular adenopathy. Lungs are clear to A&P. Cardiac examination is essentially unremarkable with regular rate and rhythm without murmur rub or thrill. Abdomen is benign with no organomegaly or masses noted. Motor sensory and DTR levels are equal and symmetric in the upper and lower extremities. Cranial nerves II  through XII are grossly intact. Proprioception is intact. No peripheral adenopathy or edema is identified. No motor or sensory levels are noted. Crude visual fields are within normal range.  RADIOLOGY RESULTS: Mammogram and ultrasound reviewed compatible with above-stated findings  PLAN: Present time patient is now 1 year out with no evidence of disease.  I have asked her to alert her GYN about her tamoxifen use as regards and possible spotting.  I will see her back in 1 year for follow-up.  Patient knows to call with any concerns.  I would like to take this opportunity to thank you for allowing me to participate in the care of your patient.Noreene Filbert, MD

## 2021-06-03 ENCOUNTER — Emergency Department
Admission: EM | Admit: 2021-06-03 | Discharge: 2021-06-03 | Disposition: A | Discharge status: Home / Self Care | Payer: BC Managed Care – PPO | Attending: Emergency Medicine | Admitting: Emergency Medicine

## 2021-06-03 ENCOUNTER — Emergency Department: Payer: BC Managed Care – PPO

## 2021-06-03 DIAGNOSIS — Z7951 Long term (current) use of inhaled steroids: Secondary | ICD-10-CM | POA: Diagnosis not present

## 2021-06-03 DIAGNOSIS — Z853 Personal history of malignant neoplasm of breast: Secondary | ICD-10-CM | POA: Diagnosis not present

## 2021-06-03 DIAGNOSIS — Z79899 Other long term (current) drug therapy: Secondary | ICD-10-CM | POA: Diagnosis not present

## 2021-06-03 DIAGNOSIS — Z20822 Contact with and (suspected) exposure to covid-19: Secondary | ICD-10-CM | POA: Diagnosis not present

## 2021-06-03 DIAGNOSIS — R0602 Shortness of breath: Secondary | ICD-10-CM | POA: Insufficient documentation

## 2021-06-03 DIAGNOSIS — Z8616 Personal history of COVID-19: Secondary | ICD-10-CM | POA: Diagnosis not present

## 2021-06-03 DIAGNOSIS — Z7984 Long term (current) use of oral hypoglycemic drugs: Secondary | ICD-10-CM | POA: Diagnosis not present

## 2021-06-03 DIAGNOSIS — R110 Nausea: Secondary | ICD-10-CM

## 2021-06-03 DIAGNOSIS — R059 Cough, unspecified: Secondary | ICD-10-CM | POA: Insufficient documentation

## 2021-06-03 DIAGNOSIS — E119 Type 2 diabetes mellitus without complications: Secondary | ICD-10-CM | POA: Diagnosis not present

## 2021-06-03 DIAGNOSIS — J45909 Unspecified asthma, uncomplicated: Secondary | ICD-10-CM | POA: Insufficient documentation

## 2021-06-03 DIAGNOSIS — I10 Essential (primary) hypertension: Secondary | ICD-10-CM | POA: Insufficient documentation

## 2021-06-03 DIAGNOSIS — J101 Influenza due to other identified influenza virus with other respiratory manifestations: Secondary | ICD-10-CM | POA: Insufficient documentation

## 2021-06-03 DIAGNOSIS — R000 Tachycardia, unspecified: Secondary | ICD-10-CM | POA: Insufficient documentation

## 2021-06-03 DIAGNOSIS — K529 Noninfective gastroenteritis and colitis, unspecified: Secondary | ICD-10-CM

## 2021-06-03 DIAGNOSIS — R1031 Right lower quadrant pain: Secondary | ICD-10-CM | POA: Diagnosis present

## 2021-06-03 LAB — COMPREHENSIVE METABOLIC PANEL
ALT: 20 U/L (ref 0–44)
AST: 33 U/L (ref 15–41)
Albumin: 3.9 g/dL (ref 3.5–5.0)
Alkaline Phosphatase: 67 U/L (ref 38–126)
Anion gap: 10 (ref 5–15)
BUN: 8 mg/dL (ref 6–20)
CO2: 26 mmol/L (ref 22–32)
Calcium: 9 mg/dL (ref 8.9–10.3)
Chloride: 101 mmol/L (ref 98–111)
Creatinine, Ser: 0.64 mg/dL (ref 0.44–1.00)
GFR, Estimated: >60 mL/min (ref >60)
Glucose, Bld: 191 mg/dL — ABNORMAL HIGH (ref 70–99)
Potassium: 3.7 mmol/L (ref 3.5–5.1)
Sodium: 137 mmol/L (ref 135–145)
Total Bilirubin: 0.3 mg/dL (ref 0.3–1.2)
Total Protein: 7.9 g/dL (ref 6.5–8.1)

## 2021-06-03 LAB — URINALYSIS, ROUTINE W REFLEX MICROSCOPIC
Bilirubin Urine: NEGATIVE
Glucose, UA: NEGATIVE mg/dL
Hgb urine dipstick: NEGATIVE
Ketones, ur: NEGATIVE mg/dL
Leukocytes,Ua: NEGATIVE
Nitrite: NEGATIVE
Protein, ur: NEGATIVE mg/dL
Specific Gravity, Urine: 1.015 (ref 1.005–1.030)
pH: 5 (ref 5.0–8.0)

## 2021-06-03 LAB — CBC WITH DIFFERENTIAL/PLATELET
Abs Immature Granulocytes: 0.06 10*3/uL (ref 0.00–0.07)
Basophils Absolute: 0 10*3/uL (ref 0.0–0.1)
Basophils Relative: 1 %
Eosinophils Absolute: 0.2 10*3/uL (ref 0.0–0.5)
Eosinophils Relative: 2 %
HCT: 38.8 % (ref 36.0–46.0)
Hemoglobin: 13 g/dL (ref 12.0–15.0)
Immature Granulocytes: 1 %
Lymphocytes Relative: 32 %
Lymphs Abs: 2.7 10*3/uL (ref 0.7–4.0)
MCH: 30.3 pg (ref 26.0–34.0)
MCHC: 33.5 g/dL (ref 30.0–36.0)
MCV: 90.4 fL (ref 80.0–100.0)
Monocytes Absolute: 0.5 10*3/uL (ref 0.1–1.0)
Monocytes Relative: 6 %
Neutro Abs: 5.1 10*3/uL (ref 1.7–7.7)
Neutrophils Relative %: 58 %
Platelets: 358 10*3/uL (ref 150–400)
RBC: 4.29 MIL/uL (ref 3.87–5.11)
RDW: 12.2 % (ref 11.5–15.5)
WBC: 8.6 10*3/uL (ref 4.0–10.5)
nRBC: 0 % (ref 0.0–0.2)

## 2021-06-03 LAB — RESP PANEL BY RT-PCR (FLU A&B, COVID) ARPGX2
Influenza A by PCR: NEGATIVE
Influenza B by PCR: NEGATIVE
SARS Coronavirus 2 by RT PCR: NEGATIVE

## 2021-06-03 LAB — LIPASE, BLOOD: Lipase: 35 U/L (ref 11–51)

## 2021-06-03 MED ORDER — DICYCLOMINE HCL 10 MG PO CAPS: 10.0000 mg | ORAL_CAPSULE | Freq: Four times a day (QID) | ORAL | 0 refills | Status: DC

## 2021-06-03 MED ORDER — ONDANSETRON 4 MG PO TBDP: 4.0000 mg | ORAL_TABLET | Freq: Three times a day (TID) | ORAL | 0 refills | Status: DC | PRN

## 2021-06-03 MED ORDER — SODIUM CHLORIDE 0.9 % IV BOLUS
1000.0000 mL | Freq: Once | INTRAVENOUS | Status: AC
  Administered 2021-06-03: 1000 mL via INTRAVENOUS

## 2021-06-03 MED ORDER — IOHEXOL 350 MG/ML SOLN
75.0000 mL | Freq: Once | INTRAVENOUS | Status: AC | PRN
  Administered 2021-06-03: 75 mL via INTRAVENOUS
  Filled 2021-06-03: qty 75

## 2021-06-03 MED ORDER — ONDANSETRON HCL 4 MG/2ML IJ SOLN
4.0000 mg | Freq: Once | INTRAMUSCULAR | Status: AC
  Administered 2021-06-03: 4 mg via INTRAVENOUS
  Filled 2021-06-03: qty 2

## 2021-06-03 MED ORDER — HYDROMORPHONE HCL 1 MG/ML IJ SOLN
0.5000 mg | Freq: Once | INTRAMUSCULAR | Status: AC
Start: 2021-06-03 — End: 2021-06-03
  Administered 2021-06-03: 0.5 mg via INTRAVENOUS
  Filled 2021-06-03: qty 1

## 2021-06-03 NOTE — ED Notes (Signed)
Pt gave verbal acknowledgement of receiving discharge papers.  Given the opportunity to ask questions and had none. Signature pad not working.

## 2021-06-03 NOTE — ED Triage Notes (Signed)
Patient presents to ER from home. Patient reports she has had RLQ pain radiating to right lower back for 1 week. Patient reports diarrhea for 2 days. Patient reports stool appears dark and possibly bloody. Patient denies taking blood thinners. Patient A&OX3.

## 2021-06-03 NOTE — ED Provider Notes (Signed)
[facility]  [facility] Emergency Department Provider Note  ____________________________________________   Event Date/Time   First MD Initiated Contact with Patient 06/03/21 1824     (approximate)  I have reviewed the triage vital signs and the nursing notes.   HISTORY  Chief Complaint Abdominal Pain    HPI Diane Cox is a 59 y.o. female with prior breast cancer, diabetes, irritable bowel syndrome, asthma who comes in with concerns for abdominal pain.  Patient reports about 1 week of abdominal pain.  She states that she initially thought it was her irritable bowel syndrome but it has been getting worse and she develops more pain in the right lower quadrant, constant, nothing makes it better or worse.  She does report some intermittent diarrhea as well as some nausea associated with it.  She reports concern for some darker stools but denies any blood thinners or history of bleeding in the past.  She does report a little bit of cough and some baseline shortness of breath from her asthma but denies anything different than her normal.  Patient's had prior hysterectomy.  She also reports that she had COVID back in April but denies any recent infections.           Past Medical History:  Diagnosis Date   Anemia    Arthritis    Asthma    WELL CONTROLLED   Cancer ([place])    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 8/54/62   Complication of anesthesia    DDD (degenerative disc disease), cervical    AND SPINE   Diabetes mellitus without complication (HCC)    Elevated lipids    Family history of breast cancer    Family history of colon cancer    Family history of uterine cancer    Fatty liver    H/O   GERD (gastroesophageal reflux disease)    History of kidney stones    H/O   IBS (irritable bowel syndrome)    IBS (irritable bowel syndrome)    Migraines    Obesity    Personal history of radiation therapy    PONV (postoperative nausea and vomiting)     PHENERGAN WORKS    Seasonal allergies    Sleep apnea    USES CPAP    Patient Active Problem List   Diagnosis Date Noted   Genetic testing 12/20/2019   Family history of uterine cancer    Family history of breast cancer    Family history of colon cancer    Ductal carcinoma in situ (DCIS) of right breast 11/25/2019   Asthma without status asthmaticus 11/23/2019   GERD (gastroesophageal reflux disease) 11/23/2019   Hyperlipidemia 11/23/2019   Hypertension 11/23/2019   Lipoma of back 12/09/2018   Acute bronchitis 11/05/2018   Migraine with aura and without status migrainosus, not intractable 06/08/2018   Myofascial pain syndrome, cervical 07/03/2017   DDD (degenerative disc disease), lumbar 11/26/2016   Arthritis 10/21/2016   IBS (irritable bowel syndrome) 10/21/2016   Morbid obesity with BMI of 40.0-44.9, adult ([place]) 08/14/2016    Past Surgical History:  Procedure Laterality Date   ABDOMINAL HYSTERECTOMY     BREAST BIOPSY Right 11/17/2019   affirm bx, coil marker, DUCTAL CARCINOMA IN SITU, INTERMEDIATE GRADE   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, clear marginsd   CHOLECYSTECTOMY     COLONOSCOPY WITH PROPOFOL N/A 11/25/2016   Procedure: COLONOSCOPY WITH PROPOFOL;  Surgeon: Lollie Sails, MD;  Location: [facility] ENDOSCOPY;  Service: Endoscopy;  Laterality: N/A;   ESOPHAGOGASTRODUODENOSCOPY (EGD) WITH PROPOFOL N/A 11/25/2016   Procedure: ESOPHAGOGASTRODUODENOSCOPY (EGD) WITH PROPOFOL;  Surgeon: Lollie Sails, MD;  Location: [facility] ENDOSCOPY;  Service: Endoscopy;  Laterality: N/A;   EXCISION OF ENDOMETRIOMA     PARTIAL MASTECTOMY WITH NEEDLE LOCALIZATION Right 12/19/2019   Procedure: PARTIAL MASTECTOMY WITH NEEDLE LOCALIZATION;  Surgeon: Herbert Pun, MD;  Location: [facility] ORS;  Service: General;  Laterality: Right;   TONSILLECTOMY      Prior to Admission medications   Medication Sig Start Date End Date Taking? Authorizing Provider  albuterol (PROVENTIL  HFA;VENTOLIN HFA) 108 (90 BASE) MCG/ACT inhaler Inhale 4-6 puffs by mouth every 4 hours as needed for wheezing, cough, and/or shortness of breath Patient taking differently: Inhale 1-2 puffs into the lungs every 4 (four) hours as needed (wheeze, cough and/or shortness of breath). 07/23/15   Hinda Kehr, MD  BREO ELLIPTA 100-25 MCG/INH AEPB Inhale 1 puff into the lungs daily as needed (respiratory issues.).  05/22/19   [provider]  cetirizine (ZYRTEC) 10 MG tablet Take 10 mg by mouth every evening.    [provider]  Cholecalciferol (VITAMIN D-3) 125 MCG (5000 UT) TABS Take 5,000 Units by mouth at bedtime.    [provider]  cyclobenzaprine (FLEXERIL) 5 MG tablet Take 5 mg by mouth at bedtime. 02/15/20   [provider]  escitalopram (LEXAPRO) 10 MG tablet Take 10 mg by mouth every morning.     [provider]  fluticasone (FLONASE) 50 MCG/ACT nasal spray Place 2 sprays into the nose daily as needed (allergies.).  Patient not taking: Reported on 05/08/2021 05/22/17 02/25/21  [provider]  gabapentin (NEURONTIN) 100 MG capsule Take 200 mg by mouth 2 (two) times daily as needed (arthritis pain.).     [provider]  glipiZIDE (GLUCOTROL XL) 10 MG 24 hr tablet Take 10 tablets by mouth 2 (two) times daily. 12/21/19 05/08/21  [provider]  hydrochlorothiazide (MICROZIDE) 12.5 MG capsule Take 12.5 mg by mouth daily.     [provider]  hyoscyamine (LEVSIN) 0.125 MG tablet Take 0.125 mg by mouth every 6 (six) hours as needed. PT HAS NOT PICKED UP RX AS OF 12-14-19    [provider]  Magnesium Oxide (MAG-200 PO) Take 200 mg by mouth at bedtime.    [provider]  metFORMIN (GLUCOPHAGE-XR) 500 MG 24 hr tablet Take by mouth. 02/05/21   [provider]  Methylcellulose, Laxative, (CITRUCEL) 500 MG TABS Take 500 mg by mouth daily.    [provider]  montelukast (SINGULAIR) 10 MG tablet Take  10 mg by mouth every morning.     [provider]  NALTREXONE HCL PO Take by mouth.    [provider]  pantoprazole (PROTONIX) 40 MG tablet Take 40 mg by mouth every morning.     [provider]  Potassium 99 MG TABS Take 99 mg by mouth daily.     [provider]  Probiotic Product (DIGESTIVE ADVANTAGE GUMMIES PO) Take 2 tablets by mouth every evening.    [provider]  rosuvastatin (CRESTOR) 10 MG tablet Take 10 mg by mouth daily. 04/12/20   [provider]  silver sulfADIAZINE (SILVADENE) 1 % cream Apply 1 application topically 2 (two) times daily. Patient not taking: No sig reported 03/02/20   Noreene Filbert, MD  sucralfate (CARAFATE) 1 G tablet Take 1 g by mouth 2 (two) times daily.     [provider]  tamoxifen (NOLVADEX) 20 MG tablet Take 1 tablet by mouth once daily 12/17/20   Lloyd Huger, MD  topiramate (TOPAMAX) 25 MG tablet Take 25 mg by mouth 2 (two) times daily. 05/23/19   [provider]  vitamin E 180 MG (400 UNITS) capsule Take by mouth.    [provider]    Allergies Chicken allergy, Compazine [prochlorperazine edisylate], Other, and Pork-derived products  Family History  Problem Relation Age of Onset   Heart attack Father    Uterine cancer Mother        dx 72s   Breast cancer Maternal Aunt        dx 53s, recurrence in 31s   Uterine cancer Maternal Grandmother        dx 50s-60s   Uterine cancer Sister        dx 28s   Bone cancer Brother        dx 31s   Colon cancer Maternal Aunt    Stomach cancer Maternal Aunt    Throat cancer Maternal Aunt    Colon cancer Cousin     Social History Social History   Tobacco Use   Smoking status: Never   Smokeless tobacco: Never  Vaping Use   Vaping Use: Never used  Substance Use Topics   Alcohol use: Yes    Alcohol/week: 0.0 standard drinks    Comment: occasional   Drug use: No      Review of Systems Constitutional: No  fever/chills, chills Eyes: No visual changes. ENT: No sore throat. Cardiovascular: Denies chest pain. Respiratory: Denies shortness of breath.  Positive cough Gastrointestinal: Abdominal pain, nausea, diarrhea Genitourinary: Negative for dysuria. Musculoskeletal: Negative for back pain. Skin: Negative for rash. Neurological: Negative for headaches, focal weakness or numbness. All other ROS negative ____________________________________________   PHYSICAL EXAM:  VITAL SIGNS: ED Triage Vitals  Enc Vitals Group     BP 06/03/21 1740 (!) 149/94     Pulse Rate 06/03/21 1740 (!) 107     Resp 06/03/21 1740 18     Temp 06/03/21 1740 99.6 F (37.6 C)     Temp Source 06/03/21 1740 Oral     SpO2 06/03/21 1740 96 %     Weight 06/03/21 1741 231 lb 7.7 oz (105 kg)     Height 06/03/21 1741 5' 1" (1.549 m)     Head Circumference --      Peak Flow --      Pain Score 06/03/21 1740 8     Pain Loc --      Pain Edu? --      Excl. in [place]? --     Constitutional: Alert and oriented. Well appearing and in no acute distress. Eyes: Conjunctivae are normal. EOMI. Head: Atraumatic. Nose: No congestion/rhinnorhea. Mouth/Throat: Mucous membranes are moist.   Neck: No stridor. Trachea Midline. FROM Cardiovascular: Tachycardic, regular rhythm. Grossly normal heart sounds.  Good peripheral circulation. Respiratory: Normal respiratory effort.  No retractions. Lungs CTAB. Gastrointestinal: Tender in the right lower quadrant.  No distention. No abdominal bruits.  Musculoskeletal: No lower extremity tenderness nor edema.  No joint effusions. Neurologic:  Normal speech and language. No gross focal neurologic deficits are appreciated.  Skin:  Skin is warm, dry and intact. No rash noted. Psychiatric: Mood and affect are normal. Speech and behavior are normal. GU: brown hemeoccult negative   ____________________________________________   LABS (all labs ordered are listed, but only abnormal results are  displayed)  Labs Reviewed  COMPREHENSIVE METABOLIC  PANEL - Abnormal; Notable for the following components:      Result Value   Glucose, Bld 191 (*)    All other components within normal limits  URINALYSIS, ROUTINE W REFLEX MICROSCOPIC - Abnormal; Notable for the following components:   Color, Urine YELLOW (*)    APPearance HAZY (*)    All other components within normal limits  RESP PANEL BY RT-PCR (FLU A&B, COVID) ARPGX2  LIPASE, BLOOD  CBC WITH DIFFERENTIAL/PLATELET   ____________________________________________   RADIOLOGY Robert Bellow, personally viewed and evaluated these images (plain radiographs) as part of my medical decision making, as well as reviewing the written report by the radiologist.  ED MD interpretation: No pneumonia  Official radiology report(s): DG Chest 2 View  Result Date: 06/03/2021 CLINICAL DATA:  Cough and fever EXAM: CHEST - 2 VIEW COMPARISON:  06/17/2019 FINDINGS: The heart size and mediastinal contours are within normal limits. Both lungs are clear. The visualized skeletal structures are unremarkable. IMPRESSION: No active cardiopulmonary disease. Electronically Signed   By: Ulyses Jarred M.D.   On: 06/03/2021 19:29   CT ABDOMEN PELVIS W CONTRAST  Result Date: 06/03/2021 CLINICAL DATA:  Abdominal distension, right lower quadrant pain EXAM: CT ABDOMEN AND PELVIS WITH CONTRAST TECHNIQUE: Multidetector CT imaging of the abdomen and pelvis was performed using the standard protocol following bolus administration of intravenous contrast. CONTRAST:  38m OMNIPAQUE IOHEXOL 350 MG/ML SOLN COMPARISON:  12/14/2019 CT, 06/03/2016 MRI FINDINGS: Lower chest: Included lung bases are clear.  Heart size is normal. Hepatobiliary: Mild hepatomegaly with diffuse hepatic steatosis. No focal liver lesion identified. Prior cholecystectomy. No biliary dilatation. Pancreas: Unremarkable. No pancreatic ductal dilatation or surrounding inflammatory changes. Spleen: Normal in size  without focal abnormality. Adrenals/Urinary Tract: Unremarkable adrenal glands. Kidneys enhance symmetrically without focal lesion, stone, or hydronephrosis. Ureters are nondilated. Urinary bladder is decompressed, limiting its evaluation. Stomach/Bowel: Stomach is within normal limits. Appendix appears normal (series 2, image 67). No evidence of bowel wall thickening, distention, or inflammatory changes. Vascular/Lymphatic: Scattered aortoiliac atherosclerotic calcifications without aneurysm. No abdominopelvic lymphadenopathy. Reproductive: Status post hysterectomy. No adnexal masses. Subcentimeter left ovarian cyst has decreased in size from prior. No follow-up imaging recommended. Note: This recommendation does not apply to premenarchal patients and to those with increased risk (genetic, family history, elevated tumor markers or other high-risk factors) of ovarian cancer. Reference: JACR 2020 Feb; 17(2):248-254 Other: No free fluid. No abdominopelvic fluid collection. No pneumoperitoneum. No abdominal wall hernia. Musculoskeletal: No acute or significant osseous findings. IMPRESSION: 1. No acute abdominopelvic findings. Normal appendix. 2. Mild hepatomegaly with diffuse hepatic steatosis. Aortic Atherosclerosis (ICD10-I70.0). Electronically Signed   By: NDavina PokeD.O.   On: 06/03/2021 20:37    ____________________________________________   PROCEDURES  Procedure(s) performed (including Critical Care):  Procedures   ____________________________________________   INITIAL IMPRESSION / ASSESSMENT AND PLAN / ED COURSE  KMatti Killingsworthwas evaluated in Emergency Department on 06/03/2021 for the symptoms described in the history of present illness. She was evaluated in the context of the global COVID-19 pandemic, which necessitated consideration that the patient might be at risk for infection with the SARS-CoV-2 virus that causes COVID-19. Institutional protocols and algorithms that pertain to the  evaluation of patients at risk for COVID-19 are in a state of rapid change based on information released by regulatory bodies including the CDC and federal and state organizations. These policies and algorithms were followed during the patient's care in the ED.    Patient comes in with some low-grade  temperatures, tachycardia and right lower quadrant pain.  Will get CT scan to evaluate for appendicitis, abscess, diverticulitis.  Rectal exam with brown stool Hemoccult negative.  We will send COVID swab.  Will get chest x-ray to make sure no evidence of pneumonia  Labs are reassuring.  glucose elevated but no evidence of DKA.  Chest x-ray without evidence of pneumonia, COVID swab is negative.  CT scan is reassuring.  Reevaluated patient she is feeling much better after the medications.  She still has a little bit of right lower quadrant tenderness but the CT scan does not show appendicitis.  Discussed with patient that CTs are reassuring but not always 100% but the fact that she is afebrile on recheck without any Tylenol or ibuprofen and the fact that she has no white count elevation and the CT does not show signs of appendicitis that this is very reassuring.  This time she feels comfortable monitoring her symptoms at home but will return if she develops worsening symptoms.  Patient had never met sepsis criteria does have the elevated heart rate which could have just been from some dehydration from the diarrhea.  Discussed with her that is possible that she has some gastroenteritis given the diarrhea.  No significant diarrhea today to suggest C. difficile testing.  At this time patient feels comfortable going home and will trial some Bentyl and Zofran to help I will return to the ER if her symptoms are worsening or changing.  I discussed the provisional nature of ED diagnosis, the treatment so far, the ongoing plan of care, follow up appointments and return precautions with the patient and any family or  support people present. They expressed understanding and agreed with the plan, discharged home.          ____________________________________________   FINAL CLINICAL IMPRESSION(S) / ED DIAGNOSES   Final diagnoses:  Nausea  Gastroenteritis      MEDICATIONS GIVEN DURING THIS VISIT:  Medications  sodium chloride 0.9 % bolus 1,000 mL (0 mLs Intravenous Stopped 06/03/21 2054)  ondansetron (ZOFRAN) injection 4 mg (4 mg Intravenous Given 06/03/21 1851)  HYDROmorphone (DILAUDID) injection 0.5 mg (0.5 mg Intravenous Given 06/03/21 1848)  iohexol (OMNIPAQUE) 350 MG/ML injection 75 mL (75 mLs Intravenous Contrast Given 06/03/21 2022)     ED Discharge Orders     None        Note:  This document was prepared using Dragon voice recognition software and may include unintentional dictation errors.    Vanessa Eastwood, MD 06/03/21 2106

## 2021-06-03 NOTE — Discharge Instructions (Addendum)
Your CT scan was reassuring and we have started you on some Zofran and Bentyl to help with the nausea vomiting and cramping.  You should return to the ER if he develops worsening fevers, worsening pain or any other concerns but at this time these are reassuring.  Can follow-up with your primary care doctor in 2 days for repeat evaluation.    IMPRESSION: 1. No acute abdominopelvic findings. Normal appendix. 2. Mild hepatomegaly with diffuse hepatic steatosis.

## 2021-06-25 ENCOUNTER — Other Ambulatory Visit: Payer: Self-pay

## 2021-06-25 ENCOUNTER — Encounter: Payer: BC Managed Care – PPO | Attending: Family Medicine

## 2021-06-25 VITALS — Ht 60.5 in | Wt 230.4 lb

## 2021-06-25 DIAGNOSIS — D0511 Intraductal carcinoma in situ of right breast: Secondary | ICD-10-CM

## 2021-06-25 NOTE — Progress Notes (Signed)
CARE Daily Session Note  Patient Details  Name: Diane Cox MRN: [id number] Date of Birth: 05-08-1961 Referring Provider:   April Manson [facility] from 06/25/2021 in [facility] Cardiac and Pulmonary Rehab  Referring Provider Faythe Casa NP       Encounter Date: 06/25/2021  Check In:  Session Check In - 06/25/21 1401       Check-In   Supervising physician immediately available to respond to emergencies See telemetry face sheet for immediately available ER MD    Location [facility]-Cardiac & Pulmonary Rehab    Staff Present Coralie Keens, MS, ASCM CEP, Exercise Physiologist;Kelly Rosalia Hammers, MPA, RN;Jessica Luan Pulling, MA, RCEP, CCRP, CCET    Virtual Visit No    Medication changes reported     No    Fall or balance concerns reported    No    Warm-up and Cool-down Not performed (comment)   6MWT and Gym Orientation   Resistance Training Performed No    VAD Patient? No    PAD/SET Patient? No               Exercise Prescription Changes - 06/25/21 1400       Response to Exercise   Blood Pressure (Admit) 142/82    Blood Pressure (Exercise) 172/80    Blood Pressure (Exit) 150/78    Heart Rate (Admit) 88 bpm    Heart Rate (Exercise) 129 bpm    Heart Rate (Exit) 91 bpm    Oxygen Saturation (Admit) 96 %    Oxygen Saturation (Exercise) 92 %    Oxygen Saturation (Exit) 97 %    Rating of Perceived Exertion (Exercise) 11    Perceived Dyspnea (Exercise) 1    Symptoms Right leg pain 2/10, right abdomen pain 4/10    Comments walk test results             6 Minute Walk     Row Name 06/25/21 1402         6 Minute Walk   Phase Initial     Distance 1080 feet     Walk Time 6 minutes     # of Rest Breaks 0     MPH 2.04     METS 2.92     RPE 11     Perceived Dyspnea  1     VO2 Peak 10.22     Symptoms Yes (comment)     Comments Right leg pain 2/10, abdomen pain 4/10 (already in process getting evaluated by doctor)     Resting HR 88 bpm      Resting BP 142/82     Resting Oxygen Saturation  96 %     Exercise Oxygen Saturation  during 6 min walk 92 %     Max Ex. HR 129 bpm     Max Ex. BP 172/80     2 Minute Post BP 150/78              Social History   Tobacco Use  Smoking Status Never  Smokeless Tobacco Never    Goals Met:  Proper associated with RPD/PD & O2 Sat Exercise tolerated well Personal goals reviewed No report of cardiac concerns or symptoms  Goals Unmet:  Not Applicable  Comments: First full day of orientation. All starting workloads were established based on the results of the 6 minute walk test done at initial orientation visit.  The plan for exercise progression was also introduced and progression will be customized based on patient's performance  and goals.    Dr. Emily Filbert is Medical Director for [facility].  Dr. Ottie Glazier is Medical Director for [facility] Pulmonary Rehabilitation.

## 2021-07-04 ENCOUNTER — Ambulatory Visit
Admission: RE | Admit: 2021-07-04 | Discharge: 2021-07-04 | Disposition: A | Discharge status: Home / Self Care | Payer: BC Managed Care – PPO | Attending: Gastroenterology | Admitting: Gastroenterology

## 2021-07-04 ENCOUNTER — Ambulatory Visit: Payer: BC Managed Care – PPO | Admitting: Anesthesiology

## 2021-07-04 ENCOUNTER — Encounter: Payer: Self-pay | Admitting: *Deleted

## 2021-07-04 ENCOUNTER — Other Ambulatory Visit: Payer: Self-pay

## 2021-07-04 ENCOUNTER — Encounter
Admission: RE | Disposition: A | Discharge status: Home / Self Care | Payer: Self-pay | Source: Home / Self Care | Attending: Gastroenterology

## 2021-07-04 DIAGNOSIS — Z8049 Family history of malignant neoplasm of other genital organs: Secondary | ICD-10-CM | POA: Insufficient documentation

## 2021-07-04 DIAGNOSIS — Z8371 Family history of colonic polyps: Secondary | ICD-10-CM | POA: Diagnosis not present

## 2021-07-04 DIAGNOSIS — E119 Type 2 diabetes mellitus without complications: Secondary | ICD-10-CM | POA: Insufficient documentation

## 2021-07-04 DIAGNOSIS — Z6841 Body Mass Index (BMI) 40.0 and over, adult: Secondary | ICD-10-CM | POA: Diagnosis not present

## 2021-07-04 DIAGNOSIS — Z800 Family history of malignant neoplasm of digestive organs: Secondary | ICD-10-CM | POA: Diagnosis not present

## 2021-07-04 DIAGNOSIS — Z888 Allergy status to other drugs, medicaments and biological substances status: Secondary | ICD-10-CM | POA: Diagnosis not present

## 2021-07-04 DIAGNOSIS — Z853 Personal history of malignant neoplasm of breast: Secondary | ICD-10-CM | POA: Insufficient documentation

## 2021-07-04 DIAGNOSIS — K921 Melena: Secondary | ICD-10-CM | POA: Insufficient documentation

## 2021-07-04 DIAGNOSIS — Z803 Family history of malignant neoplasm of breast: Secondary | ICD-10-CM | POA: Insufficient documentation

## 2021-07-04 DIAGNOSIS — Z7984 Long term (current) use of oral hypoglycemic drugs: Secondary | ICD-10-CM | POA: Insufficient documentation

## 2021-07-04 DIAGNOSIS — Z91014 Allergy to mammalian meats: Secondary | ICD-10-CM | POA: Diagnosis not present

## 2021-07-04 DIAGNOSIS — Z8379 Family history of other diseases of the digestive system: Secondary | ICD-10-CM | POA: Insufficient documentation

## 2021-07-04 DIAGNOSIS — R197 Diarrhea, unspecified: Secondary | ICD-10-CM | POA: Insufficient documentation

## 2021-07-04 DIAGNOSIS — Z91018 Allergy to other foods: Secondary | ICD-10-CM | POA: Insufficient documentation

## 2021-07-04 DIAGNOSIS — K640 First degree hemorrhoids: Secondary | ICD-10-CM | POA: Insufficient documentation

## 2021-07-04 DIAGNOSIS — Z923 Personal history of irradiation: Secondary | ICD-10-CM | POA: Diagnosis not present

## 2021-07-04 DIAGNOSIS — Z79899 Other long term (current) drug therapy: Secondary | ICD-10-CM | POA: Diagnosis not present

## 2021-07-04 DIAGNOSIS — Z7951 Long term (current) use of inhaled steroids: Secondary | ICD-10-CM | POA: Insufficient documentation

## 2021-07-04 DIAGNOSIS — R112 Nausea with vomiting, unspecified: Secondary | ICD-10-CM | POA: Insufficient documentation

## 2021-07-04 DIAGNOSIS — D125 Benign neoplasm of sigmoid colon: Secondary | ICD-10-CM | POA: Insufficient documentation

## 2021-07-04 DIAGNOSIS — R1031 Right lower quadrant pain: Secondary | ICD-10-CM | POA: Diagnosis not present

## 2021-07-04 HISTORY — PX: COLONOSCOPY WITH PROPOFOL: SHX5780

## 2021-07-04 LAB — GLUCOSE, CAPILLARY: Glucose-Capillary: 173 mg/dL — ABNORMAL HIGH (ref 70–99)

## 2021-07-04 SURGERY — COLONOSCOPY WITH PROPOFOL
Anesthesia: General

## 2021-07-04 MED ORDER — LIDOCAINE HCL (CARDIAC) PF 100 MG/5ML IV SOSY
PREFILLED_SYRINGE | INTRAVENOUS | Status: DC | PRN
  Administered 2021-07-04: 50 mg via INTRAVENOUS

## 2021-07-04 MED ORDER — PROPOFOL 500 MG/50ML IV EMUL
INTRAVENOUS | Status: DC | PRN
  Administered 2021-07-04: 175 ug/kg/min via INTRAVENOUS

## 2021-07-04 MED ORDER — PROPOFOL 10 MG/ML IV BOLUS
INTRAVENOUS | Status: DC | PRN
  Administered 2021-07-04 (×2): 50 mg via INTRAVENOUS
  Administered 2021-07-04: 40 mg via INTRAVENOUS

## 2021-07-04 MED ORDER — LIDOCAINE HCL (PF) 2 % IJ SOLN
INTRAMUSCULAR | Status: AC
  Filled 2021-07-04: qty 5

## 2021-07-04 MED ORDER — PHENYLEPHRINE HCL (PRESSORS) 10 MG/ML IV SOLN
INTRAVENOUS | Status: AC
  Filled 2021-07-04: qty 1

## 2021-07-04 MED ORDER — PROPOFOL 500 MG/50ML IV EMUL
INTRAVENOUS | Status: AC
  Filled 2021-07-04: qty 50

## 2021-07-04 MED ORDER — ONDANSETRON HCL 4 MG/2ML IJ SOLN
INTRAMUSCULAR | Status: DC | PRN
  Administered 2021-07-04: 4 mg via INTRAVENOUS

## 2021-07-04 MED ORDER — SODIUM CHLORIDE 0.9 % IV SOLN: INTRAVENOUS | Status: DC

## 2021-07-04 NOTE — Transfer of Care (Signed)
Immediate Anesthesia Transfer of Care Note  Patient: Diane Cox  Procedure(s) Performed: COLONOSCOPY WITH PROPOFOL  Patient Location: PACU  Anesthesia Type:General  Level of Consciousness: awake, alert  and oriented  Airway & Oxygen Therapy: Patient Spontanous Breathing  Post-op Assessment: Report given to RN and Post -op Vital signs reviewed and stable  Post vital signs: Reviewed and stable  Last Vitals:  Vitals Value Taken Time  BP 127/55 07/04/21 0813  Temp    Pulse 95 07/04/21 0813  Resp 21 07/04/21 0813  SpO2 96 % 07/04/21 0813    Last Pain:  Vitals:   07/04/21 0706  TempSrc: Temporal  PainSc: 0-No pain         Complications: No notable events documented.

## 2021-07-04 NOTE — Op Note (Addendum)
[facility] Gastroenterology Patient Name: Diane Cox Procedure Date: 07/04/2021 7:31 AM MRN: [id number] Account #: [id number] Date of Birth: 1961-05-05 Admit Type: Outpatient Age: 60 Room: [facility] ENDO ROOM 1 Gender: Female Note Status: Finalized Procedure:             Colonoscopy Indications:           Abdominal pain in the right lower quadrant, Clinically                         significant diarrhea of unexplained origin,                         Hematochezia Providers:             Rueben Bash, DO Referring MD:          Irven Easterly. Kary Kos, MD (Referring MD) Medicines:             Monitored Anesthesia Care Complications:         No immediate complications. Estimated blood loss:                         Minimal. Procedure:             Pre-Anesthesia Assessment:                        - Prior to the procedure, a History and Physical was                         performed, and patient medications and allergies were                         reviewed. The patient is competent. The risks and                         benefits of the procedure and the sedation options and                         risks were discussed with the patient. All questions                         were answered and informed consent was obtained.                         Patient identification and proposed procedure were                         verified by the physician, the nurse, the anesthetist                         and the technician in the endoscopy suite. Mental                         Status Examination: alert and oriented. Airway                         Examination: normal oropharyngeal airway and neck  mobility. Respiratory Examination: clear to                         auscultation. CV Examination: RRR, no murmurs, no S3                         or S4. Prophylactic Antibiotics: The patient does not                         require prophylactic antibiotics. Prior                          Anticoagulants: The patient has taken no previous                         anticoagulant or antiplatelet agents. ASA Grade                         Assessment: III - A patient with severe systemic                         disease. After reviewing the risks and benefits, the                         patient was deemed in satisfactory condition to                         undergo the procedure. The anesthesia plan was to use                         monitored anesthesia care (MAC). Immediately prior to                         administration of medications, the patient was                         re-assessed for adequacy to receive sedatives. The                         heart rate, respiratory rate, oxygen saturations,                         blood pressure, adequacy of pulmonary ventilation, and                         response to care were monitored throughout the                         procedure. The physical status of the patient was                         re-assessed after the procedure.                        After obtaining informed consent, the colonoscope was                         passed under direct vision. Throughout the procedure,  the patient's blood pressure, pulse, and oxygen                         saturations were monitored continuously. The                         Colonoscope was introduced through the anus and                         advanced to the the terminal ileum, with                         identification of the appendiceal orifice and IC                         valve. The patient tolerated the procedure well. The                         colonoscopy was performed without difficulty. The                         quality of the bowel preparation was evaluated using                         the BBPS [facility] Bowel Preparation Scale) with scores                         of: Right Colon = 3, Transverse Colon = 3 and Left                          Colon = 3 (entire mucosa seen well with no residual                         staining, small fragments of stool or opaque liquid).                         The total BBPS score equals 9. The terminal ileum,                         ileocecal valve, appendiceal orifice, and rectum were                         photographed. Findings:      The perianal and digital rectal examinations were normal. Pertinent       negatives include normal sphincter tone.      The terminal ileum appeared normal. Estimated blood loss: none.      A 2 to 3 mm polyp was found in the sigmoid colon. The polyp was sessile.       The polyp was removed with a cold biopsy forceps. Resection and       retrieval were complete. Estimated blood loss was minimal.      Non-bleeding internal hemorrhoids were found during retroflexion. The       hemorrhoids were Grade I (internal hemorrhoids that do not prolapse).      The exam was otherwise without abnormality on direct and retroflexion       views.      Normal mucosa was found in the  entire colon. Biopsies were taken with a       cold forceps for histology for microscopic colitis. Estimated blood loss       was minimal. Impression:            - The examined portion of the ileum was normal.                        - One 2 to 3 mm polyp in the sigmoid colon, removed                         with a cold biopsy forceps. Resected and retrieved.                        - Non-bleeding internal hemorrhoids.                        - The examination was otherwise normal on direct and                         retroflexion views. Recommendation:        - Discharge patient to home.                        - Resume previous diet.                        - Continue present medications.                        - Await pathology results.                        - Repeat colonoscopy for surveillance based on                         pathology results.                        - Return to  referring physician as previously                         scheduled. Procedure Code(s):     --- Professional ---                        [phone], Colonoscopy, flexible; with biopsy, single or                         multiple Diagnosis Code(s):     --- Professional ---                        K64.0, First degree hemorrhoids                        K63.5, Polyp of colon                        R10.31, Right lower quadrant pain                        R19.7, Diarrhea, unspecified  K92.1, Melena (includes Hematochezia) CPT copyright 2019 American Medical Association. All rights reserved. The codes documented in this report are preliminary and upon coder review may  be revised to meet current compliance requirements. Attending Participation:      I personally performed the entire procedure. Volney American, DO Annamaria Helling DO, DO 07/04/2021 8:12:34 AM This report has been signed electronically. Number of Addenda: 0 Note Initiated On: 07/04/2021 7:31 AM Scope Withdrawal Time: 0 hours 19 minutes 40 seconds  Total Procedure Duration: 0 hours 25 minutes 15 seconds  Estimated Blood Loss:  Estimated blood loss was minimal.      [facility]

## 2021-07-04 NOTE — Anesthesia Postprocedure Evaluation (Signed)
Anesthesia Post Note  Patient: Diane Cox  Procedure(s) Performed: COLONOSCOPY WITH PROPOFOL  Patient location during evaluation: Endoscopy Anesthesia Type: General Level of consciousness: awake and alert Pain management: pain level controlled Vital Signs Assessment: post-procedure vital signs reviewed and stable Respiratory status: spontaneous breathing, nonlabored ventilation and respiratory function stable Cardiovascular status: blood pressure returned to baseline and stable Postop Assessment: no apparent nausea or vomiting Anesthetic complications: no   No notable events documented.   Last Vitals:  Vitals:   07/04/21 0830 07/04/21 0838  BP:  128/66  Pulse: 83 75  Resp: 13 (!) 25  Temp:    SpO2: 99% 98%    Last Pain:  Vitals:   07/04/21 0813  TempSrc: Temporal  PainSc:                  [place] Ouch

## 2021-07-04 NOTE — Anesthesia Preprocedure Evaluation (Addendum)
Anesthesia Evaluation  Patient identified by MRN, date of birth, ID band Patient awake    Reviewed: Allergy & Precautions, H&P , NPO status , Patient's Chart, lab work & pertinent test results  History of Anesthesia Complications (+) PONVHistory of anesthetic complications: Pt reports PONV after every anesthetic.  Airway Mallampati: III  TM Distance: >3 FB Neck ROM: full    Dental  (+) Teeth Intact   Pulmonary asthma , sleep apnea and Continuous Positive Airway Pressure Ventilation ,    Pulmonary exam normal        Cardiovascular hypertension, (-) angina(-) Past MI Normal cardiovascular exam     Neuro/Psych  Headaches, negative psych ROS   GI/Hepatic Neg liver ROS, GERD  ,  Endo/Other  diabetes, Type 2, Oral Hypoglycemic AgentsMorbid obesity  Renal/GU      Musculoskeletal  (+) Arthritis  (DDD (degenerative disc disease), cervica), Osteoarthritis,    Abdominal (+) + obese,   Peds  Hematology negative hematology ROS (+)   Anesthesia Other Findings Past Medical History: No date: Anemia No date: Arthritis No date: Asthma     Comment:  WELL CONTROLLED No date: Cancer [facility])     Comment:  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 [id number] No date: Complication of anesthesia No date: DDD (degenerative disc disease), cervical     Comment:  AND SPINE No date: Diabetes mellitus without complication (HCC) No date: Elevated lipids No date: Family history of breast cancer No date: Family history of colon cancer No date: Family history of uterine cancer No date: Fatty liver     Comment:  H/O No date: GERD (gastroesophageal reflux disease) No date: History of kidney stones     Comment:  H/O No date: IBS (irritable bowel syndrome) No date: IBS (irritable bowel syndrome) No date: Migraines No date: Obesity No date: PONV (postoperative nausea and vomiting)     Comment:  PHENERGAN WORKS  No date: Seasonal allergies No  date: Sleep apnea     Comment:  USES CPAP  Past Surgical History: No date: ABDOMINAL HYSTERECTOMY 11/17/2019: BREAST BIOPSY; Right     Comment:  affirm bx, coil marker, DUCTAL CARCINOMA IN SITU,               INTERMEDIATE GRADE No date: CHOLECYSTECTOMY 11/25/2016: COLONOSCOPY WITH PROPOFOL; N/A     Comment:  Procedure: COLONOSCOPY WITH PROPOFOL;  Surgeon: Lollie Sails, MD;  Location: [facility] ENDOSCOPY;  Service:               Endoscopy;  Laterality: N/A; 11/25/2016: ESOPHAGOGASTRODUODENOSCOPY (EGD) WITH PROPOFOL; N/A     Comment:  Procedure: ESOPHAGOGASTRODUODENOSCOPY (EGD) WITH               PROPOFOL;  Surgeon: Lollie Sails, MD;  Location:               [facility] ENDOSCOPY;  Service: Endoscopy;  Laterality: N/A; No date: EXCISION OF ENDOMETRIOMA No date: TONSILLECTOMY     Reproductive/Obstetrics negative OB ROS                            Anesthesia Physical  Anesthesia Plan  ASA: III  Anesthesia Plan: General   Post-op Pain Management:    Induction: Intravenous  PONV Risk Score and Plan: 4 or greater and Ondansetron, TIVA and Treatment may vary due to age or medical condition  Airway Management  Planned: Nasal Cannula and Natural Airway  Additional Equipment:   Intra-op Plan:   Post-operative Plan:   Informed Consent: I have reviewed the patients History and Physical, chart, labs and discussed the procedure including the risks, benefits and alternatives for the proposed anesthesia with the patient or authorized representative who has indicated his/her understanding and acceptance.     Dental Advisory Given  Plan Discussed with: Anesthesiologist and CRNA  Anesthesia Plan Comments:         Anesthesia Quick Evaluation

## 2021-07-04 NOTE — H&P (Signed)
[facility] Pre-Procedure H&P   Patient ID: Diane Cox is a 60 y.o. female.  Gastroenterology Provider: Annamaria Helling, DO  Referring Provider: Stephens November, NP PCP: [name], MD  Date: 07/04/2021  HPI Diane Cox is a 59 y.o. female who presents today for Colonoscopy for bright red blood per rectum and abdominal pain.  Pain occasionally exacerbated by food and relieved by BM. Has noted more diarrhea than constipation as of late. Has had some nausea and vomiting with this. Blood noted with stool no melena. Levsin has helped with some of the discomfort.  Last colonoscopy 2018- one small TA in descending colon, EH/IH noted.   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 [id number]   Complication of anesthesia    DDD (degenerative disc disease), cervical    AND SPINE   Diabetes mellitus without complication (HCC)    Elevated lipids    Family history of breast cancer    Family history of colon cancer    Family history of uterine cancer    Fatty liver    H/O   GERD (gastroesophageal reflux disease)    History of kidney stones    H/O   IBS (irritable bowel syndrome)    IBS (irritable bowel syndrome)    Migraines    Obesity    Personal history of radiation therapy    PONV (postoperative nausea and vomiting)    PHENERGAN WORKS    Seasonal allergies    Sleep apnea    USES CPAP    Past Surgical History:  Procedure Laterality Date   ABDOMINAL HYSTERECTOMY     BREAST BIOPSY Right 11/17/2019   affirm bx, coil marker, DUCTAL CARCINOMA IN SITU, INTERMEDIATE GRADE   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, clear marginsd   CHOLECYSTECTOMY     COLONOSCOPY WITH PROPOFOL N/A 11/25/2016   Procedure: COLONOSCOPY WITH PROPOFOL;  Surgeon: Lollie Sails, MD;  Location: [facility] ENDOSCOPY;  Service: Endoscopy;  Laterality: N/A;    ESOPHAGOGASTRODUODENOSCOPY (EGD) WITH PROPOFOL N/A 11/25/2016   Procedure: ESOPHAGOGASTRODUODENOSCOPY (EGD) WITH PROPOFOL;  Surgeon: Lollie Sails, MD;  Location: [facility] ENDOSCOPY;  Service: Endoscopy;  Laterality: N/A;   EXCISION OF ENDOMETRIOMA     PARTIAL MASTECTOMY WITH NEEDLE LOCALIZATION Right 12/19/2019   Procedure: PARTIAL MASTECTOMY WITH NEEDLE LOCALIZATION;  Surgeon: Herbert Pun, MD;  Location: [facility] ORS;  Service: General;  Laterality: Right;   TONSILLECTOMY      Family History Mother c h/o polyps Maternal aunt and cousins with crc Cousin with crohns No h/o GI disease or malignancy  Review of Systems  Constitutional:  Negative for activity change, appetite change, fatigue, fever and unexpected weight change.  HENT:  Negative for trouble swallowing and voice change.   Respiratory:  Negative for shortness of breath and wheezing.   Cardiovascular:  Negative for chest pain and palpitations.  Gastrointestinal:  Positive for abdominal pain, blood in stool, constipation, diarrhea and nausea. Negative for abdominal distention, anal bleeding, rectal pain and vomiting.  Genitourinary:  Negative for dysuria.  Musculoskeletal:  Positive for arthralgias (shoulder pain R). Negative for myalgias.  Skin:  Negative for color change and pallor.  Neurological:  Negative for dizziness, syncope and weakness.  Psychiatric/Behavioral:  Negative for agitation and confusion.   All other systems reviewed and are negative.   Medications No current  facility-administered medications on file prior to encounter.   Current Outpatient Medications on File Prior to Encounter  Medication Sig Dispense Refill   albuterol (PROVENTIL HFA;VENTOLIN HFA) 108 (90 BASE) MCG/ACT inhaler Inhale 4-6 puffs by mouth every 4 hours as needed for wheezing, cough, and/or shortness of breath (Patient taking differently: Inhale 1-2 puffs into the lungs every 4 (four) hours as needed (wheeze, cough and/or shortness of  breath).) 1 Inhaler 1   BREO ELLIPTA 100-25 MCG/INH AEPB Inhale 1 puff into the lungs daily as needed (respiratory issues.).      cetirizine (ZYRTEC) 10 MG tablet Take 10 mg by mouth every evening.     Cholecalciferol (VITAMIN D-3) 125 MCG (5000 UT) TABS Take 5,000 Units by mouth at bedtime.     cyclobenzaprine (FLEXERIL) 5 MG tablet Take 5 mg by mouth at bedtime.     escitalopram (LEXAPRO) 10 MG tablet Take 10 mg by mouth every morning.      gabapentin (NEURONTIN) 100 MG capsule Take 200 mg by mouth 2 (two) times daily as needed (arthritis pain.).      hydrochlorothiazide (MICROZIDE) 12.5 MG capsule Take 12.5 mg by mouth daily.      hyoscyamine (LEVSIN) 0.125 MG tablet Take 0.125 mg by mouth every 6 (six) hours as needed. PT HAS NOT PICKED UP RX AS OF 12-14-19     Magnesium Oxide (MAG-200 PO) Take 200 mg by mouth at bedtime.     metFORMIN (GLUCOPHAGE-XR) 500 MG 24 hr tablet Take by mouth.     Methylcellulose, Laxative, (CITRUCEL) 500 MG TABS Take 500 mg by mouth daily.     montelukast (SINGULAIR) 10 MG tablet Take 10 mg by mouth every morning.      NALTREXONE HCL PO Take by mouth.     ondansetron (ZOFRAN ODT) 4 MG disintegrating tablet Take 1 tablet (4 mg total) by mouth every 8 (eight) hours as needed for nausea or vomiting. 20 tablet 0   pantoprazole (PROTONIX) 40 MG tablet Take 40 mg by mouth every morning.      Potassium 99 MG TABS Take 99 mg by mouth daily.      Probiotic Product (DIGESTIVE ADVANTAGE GUMMIES PO) Take 2 tablets by mouth every evening.     rosuvastatin (CRESTOR) 10 MG tablet Take 40 mg by mouth daily.     silver sulfADIAZINE (SILVADENE) 1 % cream Apply 1 application topically 2 (two) times daily. 50 g 2   sucralfate (CARAFATE) 1 G tablet Take 1 g by mouth 2 (two) times daily.      tamoxifen (NOLVADEX) 20 MG tablet Take 1 tablet by mouth once daily 90 tablet 3   topiramate (TOPAMAX) 25 MG tablet Take 25 mg by mouth 2 (two) times daily.     vitamin E 180 MG (400 UNITS)  capsule Take by mouth.     dicyclomine (BENTYL) 10 MG capsule Take 1 capsule (10 mg total) by mouth 4 (four) times daily for 7 days. 28 capsule 0   fluticasone (FLONASE) 50 MCG/ACT nasal spray Place 2 sprays into the nose daily as needed (allergies.).  (Patient not taking: Reported on 05/08/2021)     glipiZIDE (GLUCOTROL XL) 10 MG 24 hr tablet Take 10 tablets by mouth 2 (two) times daily.      Pertinent medications related to GI and procedure were reviewed by me with the patient prior to the procedure   Current Facility-Administered Medications:    0.9 %  sodium chloride infusion, , Intravenous, Continuous, Annamaria Helling,  DO   0.9 %  sodium chloride infusion, , Intravenous, Continuous, Annamaria Helling, DO, Last Rate: 20 mL/hr at 07/04/21 0723, New Bag at 07/04/21 [id number]  sodium chloride     sodium chloride 20 mL/hr at 07/04/21 [id number]       Allergies  Allergen Reactions   Chicken Allergy Rash   Compazine [Prochlorperazine Edisylate] Nausea And Vomiting and Anxiety   Other Rash    BLOOD THINNERS-THE COMPONENT IN BLOOD THINNERS CAUSE A RASH AT INJECTION SITE BLOOD THINNERS-THE COMPONENT IN BLOOD THINNERS CAUSE A RASH AT INJECTION SITE   Pork-Derived Products Rash   Allergies were reviewed by me prior to the procedure  Objective    Vitals:   07/04/21 0706  BP: 137/84  Pulse: 95  Resp: 18  Temp: (!) 97 F (36.1 C)  TempSrc: Temporal  SpO2: 97%  Weight: 104.3 kg  Height: 5' (1.524 m)     Physical Exam Vitals reviewed.  Constitutional:      General: She is not in acute distress.    Appearance: Normal appearance. She is obese. She is not ill-appearing, toxic-appearing or diaphoretic.  HENT:     Head: Normocephalic and atraumatic.     Nose: Nose normal.     Mouth/Throat:     Mouth: Mucous membranes are moist.     Pharynx: Oropharynx is clear.  Eyes:     General: No scleral icterus.    Extraocular Movements: Extraocular movements intact.  Cardiovascular:      Rate and Rhythm: Normal rate and regular rhythm.     Heart sounds: Normal heart sounds. No murmur heard.   No friction rub. No gallop.  Pulmonary:     Effort: Pulmonary effort is normal. No respiratory distress.     Breath sounds: Normal breath sounds. No wheezing, rhonchi or rales.  Abdominal:     General: Bowel sounds are normal. There is no distension.     Palpations: Abdomen is soft.     Tenderness: There is no abdominal tenderness. There is no guarding or rebound.  Musculoskeletal:     Cervical back: Neck supple.     Right lower leg: No edema.     Left lower leg: No edema.  Skin:    General: Skin is warm and dry.     Coloration: Skin is not jaundiced or pale.  Neurological:     General: No focal deficit present.     Mental Status: She is alert and oriented to person, place, and time. Mental status is at baseline.  Psychiatric:        Mood and Affect: Mood normal.        Behavior: Behavior normal.        Thought Content: Thought content normal.        Judgment: Judgment normal.     Assessment:  Ms. Fiyinfoluwa Brisco is a 60 y.o. female  who presents today for Colonoscopy for bright red blood per rectum and abdominal pain.  Plan:  Colonoscopy with possible intervention today  Colonoscopy with possible biopsy, control of bleeding, polypectomy, and interventions as necessary has been discussed with the patient/patient representative. Informed consent was obtained from the patient/patient representative after explaining the indication, nature, and risks of the procedure including but not limited to death, bleeding, perforation, missed neoplasm/lesions, cardiorespiratory compromise, and reaction to medications. Opportunity for questions was given and appropriate answers were provided. Patient/patient representative has verbalized understanding is amenable to undergoing the procedure.   Annamaria Helling, DO  [facility] Gastroenterology  Portions of the record may have been  created with voice recognition software. Occasional wrong-word or 'sound-a-like' substitutions may have occurred due to the inherent limitations of voice recognition software.  Read the chart carefully and recognize, using context, where substitutions may have occurred.

## 2021-07-04 NOTE — Anesthesia Procedure Notes (Signed)
Date/Time: 07/04/2021 7:33 AM Performed by: Johnna Acosta, CRNA Pre-anesthesia Checklist: Patient identified, Emergency Drugs available, Suction available and Patient being monitored Patient Re-evaluated:Patient Re-evaluated prior to induction Oxygen Delivery Method: Supernova nasal CPAP Preoxygenation: Pre-oxygenation with 100% oxygen Induction Type: IV induction

## 2021-07-04 NOTE — Interval H&P Note (Signed)
History and Physical Interval Note: Preprocedure H&P from 07/04/21  was reviewed and there was no interval change after seeing and examining the patient.  Written consent was obtained from the patient after discussion of risks, benefits, and alternatives. Patient has consented to proceed with Colonoscopy with possible intervention   07/04/2021 7:28 AM  Terese Door  has presented today for surgery, with the diagnosis of [place].  The various methods of treatment have been discussed with the patient and family. After consideration of risks, benefits and other options for treatment, the patient has consented to  Procedure(s) with comments: COLONOSCOPY WITH PROPOFOL (N/A) - DM as a surgical intervention.  The patient's history has been reviewed, patient examined, no change in status, stable for surgery.  I have reviewed the patient's chart and labs.  Questions were answered to the patient's satisfaction.     Annamaria Helling

## 2021-07-05 ENCOUNTER — Encounter: Payer: Self-pay | Admitting: Gastroenterology

## 2021-07-05 LAB — SURGICAL PATHOLOGY

## 2021-07-09 ENCOUNTER — Other Ambulatory Visit: Payer: Self-pay

## 2021-07-09 ENCOUNTER — Encounter: Payer: BC Managed Care – PPO | Attending: Family Medicine | Admitting: *Deleted

## 2021-07-09 DIAGNOSIS — D0511 Intraductal carcinoma in situ of right breast: Secondary | ICD-10-CM | POA: Insufficient documentation

## 2021-07-09 LAB — GLUCOSE, CAPILLARY
Glucose-Capillary: 216 mg/dL — ABNORMAL HIGH (ref 70–99)
Glucose-Capillary: 152 mg/dL — ABNORMAL HIGH (ref 70–99)

## 2021-07-09 NOTE — Progress Notes (Signed)
Daily Session Note  Patient Details  Name: Diane Cox MRN: [id number] Date of Birth: 21-Dec-1960 Referring Provider:   April Manson [facility] Associated Rehabilitation & Exercise from 06/25/2021 in [facility] Cardiac and Pulmonary Rehab  Referring Provider Faythe Casa NP       Encounter Date: 07/09/2021  Check In:  Session Check In - 07/09/21 1244       Check-In   Supervising physician immediately available to respond to emergencies See telemetry face sheet for immediately available ER MD    Location [facility]-Cardiac & Pulmonary Rehab    Staff Present Alberteen Sam, MA, RCEP, CCRP, CCET;Laureen Cokedale, BS, RRT, CPFT    Virtual Visit No    Medication changes reported     No    Fall or balance concerns reported    No    Warm-up and Cool-down Performed on first and last piece of equipment    Resistance Training Performed Yes    VAD Patient? No    PAD/SET Patient? No      Pain Assessment   Currently in Pain? No/denies                Social History   Tobacco Use  Smoking Status Never  Smokeless Tobacco Never    Goals Met:  Proper associated with RPD/PD & O2 Sat Exercise tolerated well No report of concerns or symptoms today Strength training completed today  Goals Unmet:  Not Applicable  Comments: First full day of exercise!  Patient was oriented to gym and equipment including functions, settings, policies, and procedures.  Patient's individual exercise prescription and treatment plan were reviewed.  All starting workloads were established based on the results of the 6 minute walk test done at initial orientation visit.  The plan for exercise progression was also introduced and progression will be customized based on patient's performance and goals.    Dr. Emily Filbert is Medical Director for [place].  Dr. Ottie Glazier is Medical Director for [facility] Pulmonary Rehabilitation.

## 2021-07-11 ENCOUNTER — Other Ambulatory Visit: Payer: Self-pay

## 2021-07-11 DIAGNOSIS — D0511 Intraductal carcinoma in situ of right breast: Secondary | ICD-10-CM

## 2021-07-11 LAB — GLUCOSE, CAPILLARY
Glucose-Capillary: 211 mg/dL — ABNORMAL HIGH (ref 70–99)
Glucose-Capillary: 181 mg/dL — ABNORMAL HIGH (ref 70–99)

## 2021-07-11 NOTE — Progress Notes (Signed)
CARE Daily Session Note  Patient Details  Name: Diane Cox MRN: [id number] Date of Birth: [birth year] Referring Provider:   April Manson Cancer Associated Rehabilitation & Exercise from 06/25/2021 in [facility] Cardiac and Pulmonary Rehab  Referring Provider Faythe Casa NP       Encounter Date: 07/11/2021  Check In:  Session Check In - 07/11/21 1251       Check-In   Supervising physician immediately available to respond to emergencies See telemetry face sheet for immediately available ER MD    Location [facility]-Cardiac & Pulmonary Rehab    Staff Present Nada Maclachlan, BA, ACSM CEP, Exercise Physiologist;Caterina Racine Eliezer Bottom, MS, ASCM CEP, Exercise Physiologist    Virtual Visit No    Medication changes reported     No    Fall or balance concerns reported    No    Warm-up and Cool-down Performed on first and last piece of equipment    Resistance Training Performed Yes    VAD Patient? No                Social History   Tobacco Use  Smoking Status Never  Smokeless Tobacco Never    Goals Met:  Independence with exercise equipment Exercise tolerated well No report of concerns or symptoms today Strength training completed today  Goals Unmet:  Not Applicable  Comments: Pt able to follow exercise prescription today without complaint.  Will continue to monitor for progression.    Dr. Emily Filbert is Medical Director for [place].  Dr. Ottie Glazier is Medical Director for [facility] Pulmonary Rehabilitation.

## 2021-07-23 ENCOUNTER — Telehealth: Payer: Self-pay

## 2021-07-23 DIAGNOSIS — D0511 Intraductal carcinoma in situ of right breast: Secondary | ICD-10-CM

## 2021-07-23 NOTE — Telephone Encounter (Signed)
Patient called and stated she would like to be discharged from the CARE program at this time. Patient can get a new referral if she decides to re-start the program.

## 2021-07-23 NOTE — Progress Notes (Signed)
CARE Discharge Progress Report  Patient Details  Name: Diane Cox MRN: [id number] Date of Birth: [birth year] Referring Provider:   April Manson Cancer Associated Rehabilitation & Exercise from 06/25/2021 in [facility] Cardiac and Pulmonary Rehab  Referring Provider Faythe Casa NP        Number of Visits: 3   Reason for Discharge:  Early Exit:  Personal  Smoking History:  Social History   Tobacco Use  Smoking Status Never  Smokeless Tobacco Never    Diagnosis:  Ductal carcinoma in situ of right breast  ADL UCSD:   Initial Exercise Prescription:  Initial Exercise Prescription - 06/25/21 1400       Date of Initial Exercise RX and Referring Provider   Date 06/25/21    Referring Provider Faythe Casa NP      NuStep   Level 1    SPM 80    Minutes 15    METs 2.9      REL-XR   Level 1    Speed 50    Minutes 15    METs 2.9      Biostep-RELP   Level 1    SPM 50    Minutes 15    METs 2.9      Track   Laps 29    Minutes 15    METs 2.58      Prescription Details   Frequency (times per week) 2    Duration Progress to 30 minutes of continuous aerobic without signs/symptoms of physical distress      Intensity   THRR 40-80% of Max Heartrate 117-147    Ratings of Perceived Exertion 11-13    Perceived Dyspnea 0-4      Progression   Progression Continue to progress workloads to maintain intensity without signs/symptoms of physical distress.      Resistance Training   Training Prescription Yes    Weight 3 lb    Reps 10-15             Discharge Exercise Prescription (Final Exercise Prescription Changes):  Exercise Prescription Changes - 06/25/21 1400       Response to Exercise   Blood Pressure (Admit) 142/82    Blood Pressure (Exercise) 172/80    Blood Pressure (Exit) 150/78    Heart Rate (Admit) 88 bpm    Heart Rate (Exercise) 129 bpm    Heart Rate (Exit) 91 bpm    Oxygen Saturation (Admit) 96 %    Oxygen Saturation (Exercise) 92 %     Oxygen Saturation (Exit) 97 %    Rating of Perceived Exertion (Exercise) 11    Perceived Dyspnea (Exercise) 1    Symptoms Right leg pain 2/10, right abdomen pain 4/10    Comments walk test results             Functional Capacity:  6 Minute Walk     Row Name 06/25/21 1402         6 Minute Walk   Phase Initial     Distance 1080 feet     Walk Time 6 minutes     # of Rest Breaks 0     MPH 2.04     METS 2.92     RPE 11     Perceived Dyspnea  1     VO2 Peak 10.22     Symptoms Yes (comment)     Comments Right leg pain 2/10, abdomen pain 4/10 (already in process getting evaluated by doctor)     Resting HR  88 bpm     Resting BP 142/82     Resting Oxygen Saturation  96 %     Exercise Oxygen Saturation  during 6 min walk 92 %     Max Ex. HR 129 bpm     Max Ex. BP 172/80     2 Minute Post BP 150/78              Nutrition & Weight - Outcomes:  Pre Biometrics - 06/25/21 1406       Pre Biometrics   Height 5' 0.5" (1.537 m)    Weight 230 lb 6.4 oz (104.5 kg)    BMI (Calculated) 44.24

## 2021-09-12 NOTE — Progress Notes (Deleted)
[place]  [phone] [phone]  ID: Diane Cox OB: 08/11/1961  MR#: [id number]  WLN#:[id number]  Patient Care Team: [name], MD as PCP - General (Family Medicine) Theodore Demark, RN as Registered Nurse (Oncology) Rico Junker, RN as Registered Nurse (Oncology) Herbert Pun, MD as Consulting Physician (General Surgery) Noreene Filbert, MD as Referring Physician (Radiation Oncology) Lloyd Huger, MD as Consulting Physician (Oncology)  CHIEF COMPLAINT: Right breast DCIS  INTERVAL HISTORY: Patient returns to clinic today for routine 3-monthevaluation.  She currently feels well and is asymptomatic.  She is tolerating tamoxifen without significant side effects. She has no neurologic complaints.  She denies any recent fevers or illnesses.  She has a good appetite.  She has no chest pain, shortness of breath, cough, or hemoptysis.  She denies any nausea, vomiting, constipation, or diarrhea.  She has no urinary complaints.  Patient offers no specific complaints today.  REVIEW OF SYSTEMS:   Review of Systems  Constitutional: Negative.  Negative for fever, malaise/fatigue and weight loss.  Respiratory: Negative.  Negative for cough, hemoptysis and shortness of breath.   Cardiovascular: Negative.  Negative for chest pain and leg swelling.  Gastrointestinal: Negative.  Negative for abdominal pain.  Genitourinary: Negative.  Negative for dysuria.  Musculoskeletal: Negative.  Negative for back pain.  Skin: Negative.  Negative for rash.  Neurological: Negative.  Negative for dizziness, focal weakness, weakness and headaches.  Psychiatric/Behavioral: Negative.  The patient is not nervous/anxious.    As per HPI. Otherwise, a complete review of systems is negative.  PAST MEDICAL HISTORY: Past Medical History:  Diagnosis Date   Anemia    Arthritis    Asthma    WELL CONTROLLED   Cancer ([facility]ink    right br cancer with lump  eith rad tx. 17/40/81  Complication of anesthesia    DDD (degenerative disc disease), cervical    AND SPINE   Diabetes mellitus without complication (HCC)    Elevated lipids    Family history of breast cancer    Family history of colon cancer    Family history of uterine cancer    Fatty liver    H/O   GERD (gastroesophageal reflux disease)    History of kidney stones    H/O   IBS (irritable bowel syndrome)    IBS (irritable bowel syndrome)    Migraines    Obesity    Personal history of radiation therapy    PONV (postoperative nausea and vomiting)    PHENERGAN WORKS    Seasonal allergies    Sleep apnea    USES CPAP    PAST SURGICAL HISTORY: Past Surgical History:  Procedure Laterality Date   ABDOMINAL HYSTERECTOMY     BREAST BIOPSY Right 11/17/2019   affirm bx, coil marker, DUCTAL CARCINOMA IN SITU, INTERMEDIATE GRADE   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, clear marginsd   CHOLECYSTECTOMY     COLONOSCOPY WITH PROPOFOL N/A 11/25/2016   Procedure: COLONOSCOPY WITH PROPOFOL;  Surgeon: MLollie Sails MD;  [facility]  Service: Endoscopy;  Laterality: N/A;   COLONOSCOPY WITH PROPOFOL N/A 07/04/2021   Procedure: COLONOSCOPY WITH PROPOFOL;  Surgeon: RAnnamaria Helling DO;  [facility];  Service: Gastroenterology;  Laterality: N/A;  DM   ESOPHAGOGASTRODUODENOSCOPY (EGD) WITH PROPOFOL N/A 11/25/2016   Procedure: ESOPHAGOGASTRODUODENOSCOPY (EGD) WITH PROPOFOL;  Surgeon: MLollie Sails MD;  [facility];  Service: Endoscopy;  Laterality: N/A;   EXCISION OF ENDOMETRIOMA  PARTIAL MASTECTOMY WITH NEEDLE LOCALIZATION Right 12/19/2019   Procedure: PARTIAL MASTECTOMY WITH NEEDLE LOCALIZATION;  Surgeon: Herbert Pun, MD;  Location: [facility] ORS;  Service: General;  Laterality: Right;   TONSILLECTOMY      FAMILY HISTORY: Family History  Problem Relation Age of Onset   Heart attack Father    Uterine cancer Mother        dx 23s    Breast cancer Maternal Aunt        dx 30s, recurrence in 36s   Uterine cancer Maternal Grandmother        dx 50s-60s   Uterine cancer Sister        dx 80s   Bone cancer Brother        dx 83s   Colon cancer Maternal Aunt    Stomach cancer Maternal Aunt    Throat cancer Maternal Aunt    Colon cancer Cousin     ADVANCED DIRECTIVES (Y/N):  N  HEALTH MAINTENANCE: Social History   Tobacco Use   Smoking status: Never   Smokeless tobacco: Never  Vaping Use   Vaping Use: Never used  Substance Use Topics   Alcohol use: Yes    Alcohol/week: 0.0 standard drinks    Comment: occasional   Drug use: No     Colonoscopy:  PAP:  Bone density:  Lipid panel:  Allergies  Allergen Reactions   Chicken Allergy Rash   Compazine [Prochlorperazine Edisylate] Nausea And Vomiting and Anxiety   Other Rash    BLOOD THINNERS-THE COMPONENT IN BLOOD THINNERS CAUSE A RASH AT INJECTION SITE BLOOD THINNERS-THE COMPONENT IN BLOOD THINNERS CAUSE A RASH AT INJECTION SITE   Pork-Derived Products Rash    Current Outpatient Medications  Medication Sig Dispense Refill   albuterol (PROVENTIL HFA;VENTOLIN HFA) 108 (90 BASE) MCG/ACT inhaler Inhale 4-6 puffs by mouth every 4 hours as needed for wheezing, cough, and/or shortness of breath (Patient taking differently: Inhale 1-2 puffs into the lungs every 4 (four) hours as needed (wheeze, cough and/or shortness of breath).) 1 Inhaler 1   BREO ELLIPTA 100-25 MCG/INH AEPB Inhale 1 puff into the lungs daily as needed (respiratory issues.).      cetirizine (ZYRTEC) 10 MG tablet Take 10 mg by mouth every evening.     Cholecalciferol (VITAMIN D-3) 125 MCG (5000 UT) TABS Take 5,000 Units by mouth at bedtime.     cyclobenzaprine (FLEXERIL) 5 MG tablet Take 5 mg by mouth at bedtime.     dicyclomine (BENTYL) 10 MG capsule Take 1 capsule (10 mg total) by mouth 4 (four) times daily for 7 days. 28 capsule 0   escitalopram (LEXAPRO) 10 MG tablet Take 10 mg by mouth every  morning.      fluticasone (FLONASE) 50 MCG/ACT nasal spray Place 2 sprays into the nose daily as needed (allergies.).  (Patient not taking: Reported on 05/08/2021)     gabapentin (NEURONTIN) 100 MG capsule Take 200 mg by mouth 2 (two) times daily as needed (arthritis pain.).      glipiZIDE (GLUCOTROL XL) 10 MG 24 hr tablet Take 10 tablets by mouth 2 (two) times daily.     hydrochlorothiazide (MICROZIDE) 12.5 MG capsule Take 12.5 mg by mouth daily.      hyoscyamine (LEVSIN) 0.125 MG tablet Take 0.125 mg by mouth every 6 (six) hours as needed. PT HAS NOT PICKED UP RX AS OF 12-14-19     Magnesium Oxide (MAG-200 PO) Take 200 mg by mouth at bedtime.     metFORMIN (  GLUCOPHAGE-XR) 500 MG 24 hr tablet Take by mouth.     Methylcellulose, Laxative, (CITRUCEL) 500 MG TABS Take 500 mg by mouth daily.     montelukast (SINGULAIR) 10 MG tablet Take 10 mg by mouth every morning.      NALTREXONE HCL PO Take by mouth.     ondansetron (ZOFRAN ODT) 4 MG disintegrating tablet Take 1 tablet (4 mg total) by mouth every 8 (eight) hours as needed for nausea or vomiting. 20 tablet 0   pantoprazole (PROTONIX) 40 MG tablet Take 40 mg by mouth every morning.      Potassium 99 MG TABS Take 99 mg by mouth daily.      Probiotic Product (DIGESTIVE ADVANTAGE GUMMIES PO) Take 2 tablets by mouth every evening.     rosuvastatin (CRESTOR) 10 MG tablet Take 40 mg by mouth daily.     silver sulfADIAZINE (SILVADENE) 1 % cream Apply 1 application topically 2 (two) times daily. 50 g 2   sucralfate (CARAFATE) 1 G tablet Take 1 g by mouth 2 (two) times daily.      tamoxifen (NOLVADEX) 20 MG tablet Take 1 tablet by mouth once daily 90 tablet 3   topiramate (TOPAMAX) 25 MG tablet Take 25 mg by mouth 2 (two) times daily.     vitamin E 180 MG (400 UNITS) capsule Take by mouth.     No current facility-administered medications for this visit.    OBJECTIVE: There were no vitals filed for this visit.    There is no height or weight on file to  calculate BMI.    ECOG FS:0 - Asymptomatic  General: Well-developed, well-nourished, no acute distress. Eyes: Pink conjunctiva, anicteric sclera. HEENT: Normocephalic, moist mucous membranes. Breasts: Exam deferred today. Lungs: No audible wheezing or coughing. Heart: Regular rate and rhythm. Abdomen: Soft, nontender, no obvious distention. Musculoskeletal: No edema, cyanosis, or clubbing. Neuro: Alert, answering all questions appropriately. Cranial nerves grossly intact. Skin: No rashes or petechiae noted. Psych: Normal affect.  LAB RESULTS:  Lab Results  Component Value Date   NA 137 06/03/2021   K 3.7 06/03/2021   CL 101 06/03/2021   CO2 26 06/03/2021   GLUCOSE 191 (H) 06/03/2021   BUN 8 06/03/2021   CREATININE 0.64 06/03/2021   CALCIUM 9.0 06/03/2021   PROT 7.9 06/03/2021   ALBUMIN 3.9 06/03/2021   AST 33 06/03/2021   ALT 20 06/03/2021   ALKPHOS 67 06/03/2021   BILITOT 0.3 06/03/2021   GFRNONAA >60 06/03/2021   GFRAA >60 06/17/2019    Lab Results  Component Value Date   WBC 8.6 06/03/2021   NEUTROABS 5.1 06/03/2021   HGB 13.0 06/03/2021   HCT 38.8 06/03/2021   MCV 90.4 06/03/2021   PLT 358 06/03/2021     STUDIES: No results found.  ASSESSMENT: Right breast DCIS.  PLAN:    1.  Right breast DCIS: Patient underwent lumpectomy on December 19, 2019 confirming diagnosis.  Previously, she declined enrollment in the COMET trial.  Patient completed adjuvant XRT on Mar 14, 2020.  Continue tamoxifen for total of 5 years completing treatment in May 2026.  Patient's next mammogram will be scheduled in January 2022.  Return to clinic in 6 months for routine evaluation.  2.  Genetics: Negative.   I spent a total of 20 minutes reviewing chart data, face-to-face evaluation with the patient, counseling and coordination of care as detailed above.   Patient expressed understanding and was in agreement with this plan. She also understands that  She can call clinic at any  time with any questions, concerns, or complaints.   Cancer Staging Ductal carcinoma in situ (DCIS) of right breast Staging form: Breast, AJCC 8th Edition - Clinical stage from 11/25/2019: Stage 0 (cTis (DCIS), cN0, cM0, ER+, PR+, HER2-) - Signed by Lloyd Huger, MD on 11/25/2019 Stage prefix: Initial diagnosis Nuclear grade: G2   Lloyd Huger, MD   09/12/2021 12:52 PM

## 2021-09-19 ENCOUNTER — Telehealth: Payer: Self-pay | Admitting: Oncology

## 2021-09-19 ENCOUNTER — Inpatient Hospital Stay: Payer: BC Managed Care – PPO | Admitting: Oncology

## 2021-09-19 DIAGNOSIS — D0511 Intraductal carcinoma in situ of right breast: Secondary | ICD-10-CM

## 2021-09-19 NOTE — Telephone Encounter (Signed)
Pt call to cancel appt for today. Please call back to reschedule at [phone]

## 2021-10-04 NOTE — Progress Notes (Signed)
[place]  Telephone:(336) [phone] Fax:(336) [phone]  ID: Diane Cox OB: Feb 13, 1961  MR#: [id number]  YEB#:[id number]  Patient Care Team: [name], MD as PCP - General (Family Medicine) Theodore Demark, RN as Registered Nurse (Oncology) Rico Junker, RN as Registered Nurse (Oncology) Herbert Pun, MD as Consulting Physician (General Surgery) Noreene Filbert, MD as Referring Physician (Radiation Oncology) Lloyd Huger, MD as Consulting Physician (Oncology)  CHIEF COMPLAINT: Right breast DCIS  INTERVAL HISTORY: Patient returns to clinic today for routine 34-monthevaluation.  She continues to tolerate tamoxifen well without significant side effects.  She is having issues with her IBS that is being managed by GI, but otherwise feels. She has no neurologic complaints.  She denies any recent fevers or illnesses.  She has a good appetite.  She has no chest pain, shortness of breath, cough, or hemoptysis.  She denies any nausea, vomiting, constipation, or diarrhea.  She has no urinary complaints.  Patient offers no further specific complaints today.  REVIEW OF SYSTEMS:   Review of Systems  Constitutional: Negative.  Negative for fever, malaise/fatigue and weight loss.  Respiratory: Negative.  Negative for cough, hemoptysis and shortness of breath.   Cardiovascular: Negative.  Negative for chest pain and leg swelling.  Gastrointestinal:  Positive for abdominal pain.  Genitourinary: Negative.  Negative for dysuria.  Musculoskeletal: Negative.  Negative for back pain.  Skin: Negative.  Negative for rash.  Neurological: Negative.  Negative for dizziness, focal weakness, weakness and headaches.  Psychiatric/Behavioral: Negative.  The patient is not nervous/anxious.    As per HPI. Otherwise, a complete review of systems is negative.  PAST MEDICAL HISTORY: Past Medical History:  Diagnosis Date   Anemia    Arthritis    Asthma    WELL  CONTROLLED   Cancer ([place]    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 18/37/29  Complication of anesthesia    DDD (degenerative disc disease), cervical    AND SPINE   Diabetes mellitus without complication (HCC)    Elevated lipids    Family history of breast cancer    Family history of colon cancer    Family history of uterine cancer    Fatty liver    H/O   GERD (gastroesophageal reflux disease)    History of kidney stones    H/O   IBS (irritable bowel syndrome)    IBS (irritable bowel syndrome)    Migraines    Obesity    Personal history of radiation therapy    PONV (postoperative nausea and vomiting)    PHENERGAN WORKS    Seasonal allergies    Sleep apnea    USES CPAP    PAST SURGICAL HISTORY: Past Surgical History:  Procedure Laterality Date   ABDOMINAL HYSTERECTOMY     BREAST BIOPSY Right 11/17/2019   affirm bx, coil marker, DUCTAL CARCINOMA IN SITU, INTERMEDIATE GRADE   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, clear marginsd   CHOLECYSTECTOMY     COLONOSCOPY WITH PROPOFOL N/A 11/25/2016   Procedure: COLONOSCOPY WITH PROPOFOL;  Surgeon: MLollie Sails MD;  [facility];  Service: Endoscopy;  Laterality: N/A;   COLONOSCOPY WITH PROPOFOL N/A 07/04/2021   Procedure: COLONOSCOPY WITH PROPOFOL;  Surgeon: RAnnamaria Helling DO;  [facility];  Service: Gastroenterology;  Laterality: N/A;  DM   ESOPHAGOGASTRODUODENOSCOPY (EGD) WITH PROPOFOL N/A 11/25/2016   Procedure: ESOPHAGOGASTRODUODENOSCOPY (EGD) WITH PROPOFOL;  Surgeon: MLollie Sails MD;  Location: [facility] ENDOSCOPY;  Service: Endoscopy;  Laterality: N/A;   EXCISION OF ENDOMETRIOMA     PARTIAL MASTECTOMY WITH NEEDLE LOCALIZATION Right 12/19/2019   Procedure: PARTIAL MASTECTOMY WITH NEEDLE LOCALIZATION;  Surgeon: Herbert Pun, MD;  Location: [facility] ORS;  Service: General;  Laterality: Right;   TONSILLECTOMY      FAMILY HISTORY: Family History  Problem Relation Age of Onset    Heart attack Father    Uterine cancer Mother        dx 61s   Breast cancer Maternal Aunt        dx 7s, recurrence in 52s   Uterine cancer Maternal Grandmother        dx 50s-60s   Uterine cancer Sister        dx 37s   Bone cancer Brother        dx 7s   Colon cancer Maternal Aunt    Stomach cancer Maternal Aunt    Throat cancer Maternal Aunt    Colon cancer Cousin     ADVANCED DIRECTIVES (Y/N):  N  HEALTH MAINTENANCE: Social History   Tobacco Use   Smoking status: Never   Smokeless tobacco: Never  Vaping Use   Vaping Use: Never used  Substance Use Topics   Alcohol use: Yes    Alcohol/week: 0.0 standard drinks    Comment: occasional   Drug use: No     Colonoscopy:  PAP:  Bone density:  Lipid panel:  Allergies  Allergen Reactions   Chicken Allergy Rash   Compazine [Prochlorperazine Edisylate] Nausea And Vomiting and Anxiety   Other Rash    BLOOD THINNERS-THE COMPONENT IN BLOOD THINNERS CAUSE A RASH AT INJECTION SITE BLOOD THINNERS-THE COMPONENT IN BLOOD THINNERS CAUSE A RASH AT INJECTION SITE   Pork-Derived Products Rash    Current Outpatient Medications  Medication Sig Dispense Refill   albuterol (PROVENTIL HFA;VENTOLIN HFA) 108 (90 BASE) MCG/ACT inhaler Inhale 4-6 puffs by mouth every 4 hours as needed for wheezing, cough, and/or shortness of breath (Patient taking differently: Inhale 1-2 puffs into the lungs every 4 (four) hours as needed (wheeze, cough and/or shortness of breath).) 1 Inhaler 1   BREO ELLIPTA 100-25 MCG/INH AEPB Inhale 1 puff into the lungs daily as needed (respiratory issues.).      cetirizine (ZYRTEC) 10 MG tablet Take 10 mg by mouth every evening.     Cholecalciferol (VITAMIN D-3) 125 MCG (5000 UT) TABS Take 5,000 Units by mouth at bedtime.     colestipol (COLESTID) 1 g tablet Take by mouth.     cyclobenzaprine (FLEXERIL) 5 MG tablet Take 5 mg by mouth at bedtime.     escitalopram (LEXAPRO) 10 MG tablet Take 10 mg by mouth every morning.       gabapentin (NEURONTIN) 100 MG capsule Take 200 mg by mouth 2 (two) times daily as needed (arthritis pain.).      hydrochlorothiazide (MICROZIDE) 12.5 MG capsule Take 12.5 mg by mouth daily.      hyoscyamine (LEVSIN) 0.125 MG tablet Take 0.125 mg by mouth every 6 (six) hours as needed. PT HAS NOT PICKED UP RX AS OF 12-14-19     Magnesium Oxide (MAG-200 PO) Take 200 mg by mouth at bedtime.     metFORMIN (GLUCOPHAGE-XR) 500 MG 24 hr tablet Take by mouth.     Methylcellulose, Laxative, (CITRUCEL) 500 MG TABS Take 500 mg by mouth daily.     montelukast (SINGULAIR) 10 MG tablet Take 10 mg by mouth every morning.      NALTREXONE  HCL PO Take by mouth.     ondansetron (ZOFRAN ODT) 4 MG disintegrating tablet Take 1 tablet (4 mg total) by mouth every 8 (eight) hours as needed for nausea or vomiting. 20 tablet 0   pantoprazole (PROTONIX) 40 MG tablet Take 40 mg by mouth every morning.      Potassium 99 MG TABS Take 99 mg by mouth daily.      Probiotic Product (DIGESTIVE ADVANTAGE GUMMIES PO) Take 2 tablets by mouth every evening.     rosuvastatin (CRESTOR) 10 MG tablet Take 40 mg by mouth daily.     silver sulfADIAZINE (SILVADENE) 1 % cream Apply 1 application topically 2 (two) times daily. 50 g 2   sucralfate (CARAFATE) 1 G tablet Take 1 g by mouth 2 (two) times daily.      tamoxifen (NOLVADEX) 20 MG tablet Take 1 tablet by mouth once daily 90 tablet 3   topiramate (TOPAMAX) 25 MG tablet Take 25 mg by mouth 2 (two) times daily.     vitamin E 180 MG (400 UNITS) capsule Take by mouth.     dicyclomine (BENTYL) 10 MG capsule Take 1 capsule (10 mg total) by mouth 4 (four) times daily for 7 days. 28 capsule 0   fluticasone (FLONASE) 50 MCG/ACT nasal spray Place 2 sprays into the nose daily as needed (allergies.).  (Patient not taking: Reported on 05/08/2021)     glipiZIDE (GLUCOTROL XL) 10 MG 24 hr tablet Take 10 tablets by mouth 2 (two) times daily.     No current facility-administered medications for this  visit.    OBJECTIVE: Vitals:   10/09/21 1024  BP: 123/84  Pulse: 96  Resp: 16  Temp: (!) 97.5 F (36.4 C)  SpO2: 99%     Body mass index is 45.17 kg/m.    ECOG FS:0 - Asymptomatic  General: Well-developed, well-nourished, no acute distress. Eyes: Pink conjunctiva, anicteric sclera. HEENT: Normocephalic, moist mucous membranes. Breast: Exam deferred today. Lungs: No audible wheezing or coughing. Heart: Regular rate and rhythm. Abdomen: Soft, nontender, no obvious distention. Musculoskeletal: No edema, cyanosis, or clubbing. Neuro: Alert, answering all questions appropriately. Cranial nerves grossly intact. Skin: No rashes or petechiae noted. Psych: Normal affect.   LAB RESULTS:  Lab Results  Component Value Date   NA 137 06/03/2021   K 3.7 06/03/2021   CL 101 06/03/2021   CO2 26 06/03/2021   GLUCOSE 191 (H) 06/03/2021   BUN 8 06/03/2021   CREATININE 0.64 06/03/2021   CALCIUM 9.0 06/03/2021   PROT 7.9 06/03/2021   ALBUMIN 3.9 06/03/2021   AST 33 06/03/2021   ALT 20 06/03/2021   ALKPHOS 67 06/03/2021   BILITOT 0.3 06/03/2021   GFRNONAA >60 06/03/2021   GFRAA >60 06/17/2019    Lab Results  Component Value Date   WBC 8.6 06/03/2021   NEUTROABS 5.1 06/03/2021   HGB 13.0 06/03/2021   HCT 38.8 06/03/2021   MCV 90.4 06/03/2021   PLT 358 06/03/2021     STUDIES: No results found.  ASSESSMENT: Right breast DCIS.  PLAN:    1.  Right breast DCIS: Patient underwent lumpectomy on December 19, 2019 confirming diagnosis.  Previously, she declined enrollment in the COMET trial.  Patient completed adjuvant XRT on Mar 14, 2020.  Continue tamoxifen for total of 5 years completing treatment in May 2026.  Her most recent mammogram on November 01, 2020 was reported as BI-RADS 2.  Repeat in January 2023.  Return to clinic in 6 months with  video assisted telemedicine visit.   2.  Genetics: Negative.  3.  IBS: Continue evaluation and treatment per GI.  Patient had a recent  colonoscopy that removed several polyps, but otherwise no significant pathology.  I spent a total of 20 minutes reviewing chart data, face-to-face evaluation with the patient, counseling and coordination of care as detailed above.  Patient expressed understanding and was in agreement with this plan. She also understands that She can call clinic at any time with any questions, concerns, or complaints.    Cancer Staging  Ductal carcinoma in situ (DCIS) of right breast Staging form: Breast, AJCC 8th Edition - Clinical stage from 11/25/2019: Stage 0 (cTis (DCIS), cN0, cM0, ER+, PR+, HER2-) - Signed by Lloyd Huger, MD on 11/25/2019 Stage prefix: Initial diagnosis Nuclear grade: G2   Lloyd Huger, MD   10/10/2021 9:26 AM

## 2021-10-09 ENCOUNTER — Inpatient Hospital Stay: Payer: BC Managed Care – PPO | Attending: Oncology | Admitting: Oncology

## 2021-10-09 ENCOUNTER — Other Ambulatory Visit: Payer: Self-pay

## 2021-10-09 VITALS — BP 123/84 | HR 96 | Temp 97.5°F | Resp 16 | Wt 231.3 lb

## 2021-10-09 DIAGNOSIS — Z79810 Long term (current) use of selective estrogen receptor modulators (SERMs): Secondary | ICD-10-CM | POA: Insufficient documentation

## 2021-10-09 DIAGNOSIS — D0511 Intraductal carcinoma in situ of right breast: Secondary | ICD-10-CM | POA: Diagnosis present

## 2021-10-09 DIAGNOSIS — Z923 Personal history of irradiation: Secondary | ICD-10-CM | POA: Diagnosis not present

## 2021-10-09 DIAGNOSIS — Z7984 Long term (current) use of oral hypoglycemic drugs: Secondary | ICD-10-CM | POA: Diagnosis not present

## 2021-10-09 DIAGNOSIS — K589 Irritable bowel syndrome without diarrhea: Secondary | ICD-10-CM | POA: Diagnosis not present

## 2021-10-09 DIAGNOSIS — Z79899 Other long term (current) drug therapy: Secondary | ICD-10-CM | POA: Diagnosis not present

## 2021-10-09 NOTE — Progress Notes (Signed)
Pt reports IBS flare ups which led to colonoscopy sooner and found precancerous polyp. Pt also reports bilateral shaking of hands.

## 2021-11-07 ENCOUNTER — Ambulatory Visit
Admission: RE | Admit: 2021-11-07 | Discharge: 2021-11-07 | Disposition: A | Discharge status: Home / Self Care | Payer: BC Managed Care – PPO | Source: Ambulatory Visit | Attending: Oncology | Admitting: Oncology

## 2021-11-07 ENCOUNTER — Other Ambulatory Visit: Payer: Self-pay

## 2021-11-07 DIAGNOSIS — D0511 Intraductal carcinoma in situ of right breast: Secondary | ICD-10-CM | POA: Diagnosis not present

## 2022-01-02 ENCOUNTER — Other Ambulatory Visit: Payer: Self-pay | Admitting: Neurology

## 2022-01-02 DIAGNOSIS — R259 Unspecified abnormal involuntary movements: Secondary | ICD-10-CM

## 2022-01-16 ENCOUNTER — Other Ambulatory Visit: Payer: Self-pay

## 2022-01-16 ENCOUNTER — Ambulatory Visit
Admission: RE | Admit: 2022-01-16 | Discharge: 2022-01-16 | Disposition: A | Discharge status: Home / Self Care | Payer: BC Managed Care – PPO | Source: Ambulatory Visit | Attending: Neurology | Admitting: Neurology

## 2022-04-07 NOTE — Progress Notes (Unsigned)
[place]  Telephone:(336) [phone] Fax:(336) [phone]  ID: Diane Cox OB: 1961/02/18  MR#: [id number]  OVZ#:[id number]  Patient Care Team: [name], MD as PCP - General (Family Medicine) Theodore Demark, RN (Inactive) as Registered Nurse (Oncology) Rico Junker, RN as Registered Nurse (Oncology) Herbert Pun, MD as Consulting Physician (General Surgery) Noreene Filbert, MD as Referring Physician (Radiation Oncology) Lloyd Huger, MD as Consulting Physician (Oncology)  I connected with Diane Cox on 04/08/22 at  2:15 PM EDT by video enabled telemedicine visit and verified that I am speaking with the correct person using two identifiers.   I discussed the limitations, risks, security and privacy concerns of performing an evaluation and management service by telemedicine and the availability of in-person appointments. I also discussed with the patient that there may be a patient responsible charge related to this service. The patient expressed understanding and agreed to proceed.   Other persons participating in the visit and their role in the encounter: Patient, MD.  Patient's location: Home. Provider's location: Clinic.  CHIEF COMPLAINT: Right breast DCIS  INTERVAL HISTORY: Patient agreed to video assisted telemedicine visit for routine 28-monthevaluation.  Recently she noted to have a resting tremor and was subsequently diagnosed with Parkinson's.  She otherwise feels well.  She has no other neurologic complaints.  She continues to tolerate tamoxifen well without significant side effects.  She denies any recent fevers or illnesses.  She has a good appetite.  She has no chest pain, shortness of breath, cough, or hemoptysis.  She denies any nausea, vomiting, constipation, or diarrhea.  She has no urinary complaints.  Patient offers no further specific complaints today.  REVIEW OF SYSTEMS:   Review of Systems  Constitutional: Negative.   Negative for fever, malaise/fatigue and weight loss.  Respiratory: Negative.  Negative for cough, hemoptysis and shortness of breath.   Cardiovascular: Negative.  Negative for chest pain and leg swelling.  Gastrointestinal: Negative.  Negative for abdominal pain.  Genitourinary: Negative.  Negative for dysuria.  Musculoskeletal: Negative.  Negative for back pain.  Skin: Negative.  Negative for rash.  Neurological:  Positive for tremors. Negative for dizziness, focal weakness, weakness and headaches.  Psychiatric/Behavioral: Negative.  The patient is not nervous/anxious.    As per HPI. Otherwise, a complete review of systems is negative.  PAST MEDICAL HISTORY: Past Medical History:  Diagnosis Date   Anemia    Arthritis    Asthma    WELL CONTROLLED   [place]    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 14/12/87  Complication of anesthesia    DDD (degenerative disc disease), cervical    AND SPINE   Diabetes mellitus without complication (HCC)    Elevated lipids    Family history of breast cancer    Family history of colon cancer    Family history of uterine cancer    Fatty liver    H/O   GERD (gastroesophageal reflux disease)    History of kidney stones    H/O   IBS (irritable bowel syndrome)    IBS (irritable bowel syndrome)    Migraines    Obesity    Personal history of radiation therapy    PONV (postoperative nausea and vomiting)    PHENERGAN WORKS    Seasonal allergies    Sleep apnea    USES CPAP    PAST SURGICAL HISTORY: Past Surgical History:  Procedure Laterality Date   ABDOMINAL HYSTERECTOMY     BREAST  BIOPSY Right 11/17/2019   affirm bx, coil marker, DUCTAL CARCINOMA IN SITU, INTERMEDIATE GRADE   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, clear marginsd   CHOLECYSTECTOMY     COLONOSCOPY WITH PROPOFOL N/A 11/25/2016   Procedure: COLONOSCOPY WITH PROPOFOL;  Surgeon: Lollie Sails, MD;  Location: [facility] ENDOSCOPY;  Service: Endoscopy;  Laterality: N/A;    COLONOSCOPY WITH PROPOFOL N/A 07/04/2021   Procedure: COLONOSCOPY WITH PROPOFOL;  Surgeon: Annamaria Helling, DO;  Location: [facility] ENDOSCOPY;  Service: Gastroenterology;  Laterality: N/A;  DM   ESOPHAGOGASTRODUODENOSCOPY (EGD) WITH PROPOFOL N/A 11/25/2016   Procedure: ESOPHAGOGASTRODUODENOSCOPY (EGD) WITH PROPOFOL;  Surgeon: Lollie Sails, MD;  Location: [facility] ENDOSCOPY;  Service: Endoscopy;  Laterality: N/A;   EXCISION OF ENDOMETRIOMA     PARTIAL MASTECTOMY WITH NEEDLE LOCALIZATION Right 12/19/2019   Procedure: PARTIAL MASTECTOMY WITH NEEDLE LOCALIZATION;  Surgeon: Herbert Pun, MD;  Location: [facility] ORS;  Service: General;  Laterality: Right;   TONSILLECTOMY      FAMILY HISTORY: Family History  Problem Relation Age of Onset   Heart attack Father    Uterine cancer Mother        dx 44s   Breast cancer Maternal Aunt        dx 48s, recurrence in 80s   Uterine cancer Maternal Grandmother        dx 50s-60s   Uterine cancer Sister        dx 69s   Bone cancer Brother        dx 106s   Colon cancer Maternal Aunt    Stomach cancer Maternal Aunt    Throat cancer Maternal Aunt    Colon cancer Cousin     ADVANCED DIRECTIVES (Y/N):  N  HEALTH MAINTENANCE: Social History   Tobacco Use   Smoking status: Never   Smokeless tobacco: Never  Vaping Use   Vaping Use: Never used  Substance Use Topics   Alcohol use: Yes    Alcohol/week: 0.0 standard drinks    Comment: occasional   Drug use: No     Colonoscopy:  PAP:  Bone density:  Lipid panel:  Allergies  Allergen Reactions   Chicken Allergy Rash   Compazine [Prochlorperazine Edisylate] Nausea And Vomiting and Anxiety   Other Rash    BLOOD THINNERS-THE COMPONENT IN BLOOD THINNERS CAUSE A RASH AT INJECTION SITE BLOOD THINNERS-THE COMPONENT IN BLOOD THINNERS CAUSE A RASH AT INJECTION SITE   Pork-Derived Products Rash    Current Outpatient Medications  Medication Sig Dispense Refill   albuterol (PROVENTIL HFA;VENTOLIN  HFA) 108 (90 BASE) MCG/ACT inhaler Inhale 4-6 puffs by mouth every 4 hours as needed for wheezing, cough, and/or shortness of breath (Patient taking differently: Inhale 1-2 puffs into the lungs every 4 (four) hours as needed (wheeze, cough and/or shortness of breath).) 1 Inhaler 1   BREO ELLIPTA 100-25 MCG/INH AEPB Inhale 1 puff into the lungs daily as needed (respiratory issues.).      cetirizine (ZYRTEC) 10 MG tablet Take 10 mg by mouth every evening.     Cholecalciferol (VITAMIN D-3) 125 MCG (5000 UT) TABS Take 5,000 Units by mouth at bedtime.     colestipol (COLESTID) 1 g tablet Take by mouth.     cyclobenzaprine (FLEXERIL) 5 MG tablet Take 5 mg by mouth at bedtime.     escitalopram (LEXAPRO) 10 MG tablet Take 10 mg by mouth every morning.      fluticasone (FLONASE) 50 MCG/ACT nasal spray Place 2 sprays into the nose daily as  needed (allergies.).  (Patient not taking: Reported on 05/08/2021)     glipiZIDE (GLUCOTROL XL) 10 MG 24 hr tablet Take 10 tablets by mouth 2 (two) times daily.     hydrochlorothiazide (MICROZIDE) 12.5 MG capsule Take 12.5 mg by mouth daily.      hyoscyamine (LEVSIN) 0.125 MG tablet Take 0.125 mg by mouth every 6 (six) hours as needed. PT HAS NOT PICKED UP RX AS OF 12-14-19     Magnesium Oxide (MAG-200 PO) Take 200 mg by mouth at bedtime.     metFORMIN (GLUCOPHAGE-XR) 500 MG 24 hr tablet Take by mouth.     Methylcellulose, Laxative, (CITRUCEL) 500 MG TABS Take 500 mg by mouth daily.     montelukast (SINGULAIR) 10 MG tablet Take 10 mg by mouth every morning.      NALTREXONE HCL PO Take by mouth.     ondansetron (ZOFRAN ODT) 4 MG disintegrating tablet Take 1 tablet (4 mg total) by mouth every 8 (eight) hours as needed for nausea or vomiting. 20 tablet 0   pantoprazole (PROTONIX) 40 MG tablet Take 40 mg by mouth every morning.      Potassium 99 MG TABS Take 99 mg by mouth daily.      Probiotic Product (DIGESTIVE ADVANTAGE GUMMIES PO) Take 2 tablets by mouth every evening.      rosuvastatin (CRESTOR) 10 MG tablet Take 40 mg by mouth daily.     silver sulfADIAZINE (SILVADENE) 1 % cream Apply 1 application topically 2 (two) times daily. 50 g 2   sucralfate (CARAFATE) 1 G tablet Take 1 g by mouth 2 (two) times daily.      tamoxifen (NOLVADEX) 20 MG tablet Take 1 tablet by mouth once daily 90 tablet 3   topiramate (TOPAMAX) 25 MG tablet Take 25 mg by mouth 2 (two) times daily.     vitamin E 180 MG (400 UNITS) capsule Take by mouth.     No current facility-administered medications for this visit.    OBJECTIVE: There were no vitals filed for this visit.    There is no height or weight on file to calculate BMI.    ECOG FS:0 - Asymptomatic  General: Well-developed, well-nourished, no acute distress. HEENT: Normocephalic. Neuro: Alert, answering all questions appropriately. Cranial nerves grossly intact. Psych: Normal affect.   LAB RESULTS:  Lab Results  Component Value Date   NA 137 06/03/2021   K 3.7 06/03/2021   CL 101 06/03/2021   CO2 26 06/03/2021   GLUCOSE 191 (H) 06/03/2021   BUN 8 06/03/2021   CREATININE 0.64 06/03/2021   CALCIUM 9.0 06/03/2021   PROT 7.9 06/03/2021   ALBUMIN 3.9 06/03/2021   AST 33 06/03/2021   ALT 20 06/03/2021   ALKPHOS 67 06/03/2021   BILITOT 0.3 06/03/2021   GFRNONAA >60 06/03/2021   GFRAA >60 06/17/2019    Lab Results  Component Value Date   WBC 8.6 06/03/2021   NEUTROABS 5.1 06/03/2021   HGB 13.0 06/03/2021   HCT 38.8 06/03/2021   MCV 90.4 06/03/2021   PLT 358 06/03/2021     STUDIES: No results found.  ASSESSMENT: Right breast DCIS.  PLAN:    1.  Right breast DCIS: Patient underwent lumpectomy on December 19, 2019 confirming diagnosis.  Previously, she declined enrollment in the COMET trial.  Patient completed adjuvant XRT on Mar 14, 2020.  Continue tamoxifen for total of 5 years completing treatment in May 2026.  Her most recent mammogram on November 07, 2021 was reported  as BI-RADS 2.  Repeat in January  2024.  Patient will have video-assisted telemedicine visit 2 to 3 days after her next mammogram.     2.  Genetics: Negative.  3.  IBS: Resolved.  Continue evaluation and treatment per GI.  Patient had a recent colonoscopy that removed several polyps, but otherwise no significant pathology. 4.  Parkinson's: Continue current medications.  Continue follow-up with neurology as indicated.  I provided 20 minutes of face-to-face video visit time during this encounter which included chart review, counseling, and coordination of care as documented above.   Patient expressed understanding and was in agreement with this plan. She also understands that She can call clinic at any time with any questions, concerns, or complaints.    Cancer Staging  Ductal carcinoma in situ (DCIS) of right breast Staging form: Breast, AJCC 8th Edition - Clinical stage from 11/25/2019: Stage 0 (cTis (DCIS), cN0, cM0, ER+, PR+, HER2-) - Signed by Lloyd Huger, MD on 11/25/2019 Stage prefix: Initial diagnosis Nuclear grade: G2   Lloyd Huger, MD   04/08/2022 3:00 PM

## 2022-04-08 ENCOUNTER — Inpatient Hospital Stay: Payer: BC Managed Care – PPO | Attending: Oncology | Admitting: Oncology

## 2022-04-08 DIAGNOSIS — D0511 Intraductal carcinoma in situ of right breast: Secondary | ICD-10-CM | POA: Diagnosis not present

## 2022-05-07 ENCOUNTER — Other Ambulatory Visit: Payer: Self-pay | Admitting: *Deleted

## 2022-05-07 ENCOUNTER — Encounter: Payer: Self-pay | Admitting: Oncology

## 2022-05-07 MED ORDER — TAMOXIFEN CITRATE 20 MG PO TABS: 20.0000 mg | ORAL_TABLET | Freq: Every day | ORAL | 3 refills | Status: DC

## 2022-05-14 ENCOUNTER — Ambulatory Visit
Admission: RE | Admit: 2022-05-14 | Discharge: 2022-05-14 | Disposition: A | Discharge status: Home / Self Care | Payer: BC Managed Care – PPO | Source: Ambulatory Visit | Attending: Radiation Oncology | Admitting: Radiation Oncology

## 2022-05-14 VITALS — BP 123/85 | HR 100 | Temp 97.0°F | Resp 18 | Ht 61.0 in | Wt 221.0 lb

## 2022-05-14 DIAGNOSIS — Z923 Personal history of irradiation: Secondary | ICD-10-CM | POA: Diagnosis not present

## 2022-05-14 DIAGNOSIS — Z170 Estrogen receptor positive status [ER+]: Secondary | ICD-10-CM | POA: Diagnosis not present

## 2022-05-14 DIAGNOSIS — G20 Parkinson's disease: Secondary | ICD-10-CM | POA: Insufficient documentation

## 2022-05-14 DIAGNOSIS — Z79810 Long term (current) use of selective estrogen receptor modulators (SERMs): Secondary | ICD-10-CM | POA: Diagnosis not present

## 2022-05-14 DIAGNOSIS — D0511 Intraductal carcinoma in situ of right breast: Secondary | ICD-10-CM | POA: Insufficient documentation

## 2022-05-14 NOTE — Progress Notes (Signed)
Radiation Oncology Follow up Note  Name: Diane Cox   Date:   05/14/2022 MRN:  [id number] DOB: 1960/12/12    This 61 y.o. female presents to the clinic today for 2-year follow-up status post whole breast radiation to right breast for ER positive ductal carcinoma in situ.  REFERRING PROVIDER: [name], MD  HPI: Patient is a 61 year old female now out 2 years having pleated whole breast radiation to her right breast for ER positive ductal carcinoma in situ.  Seen today in routine follow-up she is doing well.  She specifically denies breast tenderness cough or bone pain.  Unfortunately she is recently been diagnosed with Parkinson's disease..  She is currently on tamoxifen tolerating it well without side effect.  Patient had recent MRI I am sure associated with the Parkinson's showing no evidence of pathology.  She had a mammogram back in January which I have reviewed were BI-RADS 2 benign  COMPLICATIONS OF TREATMENT: none  FOLLOW UP COMPLIANCE: keeps appointments   PHYSICAL EXAM:  BP 123/85   Pulse 100   Temp (!) 97 F (36.1 C)   Resp 18   Ht '5\' 1"'$  (1.549 m)   Wt 221 lb (100.2 kg)   BMI 41.76 kg/m  Lungs are clear to A&P cardiac examination essentially unremarkable with regular rate and rhythm. No dominant mass or nodularity is noted in either breast in 2 positions examined. Incision is well-healed. No axillary or supraclavicular adenopathy is appreciated. Cosmetic result is excellent.  Well-developed well-nourished patient in NAD. HEENT reveals PERLA, EOMI, discs not visualized.  Oral cavity is clear. No oral mucosal lesions are identified. Neck is clear without evidence of cervical or supraclavicular adenopathy. Lungs are clear to A&P. Cardiac examination is essentially unremarkable with regular rate and rhythm without murmur rub or thrill. Abdomen is benign with no organomegaly or masses noted. Motor sensory and DTR levels are equal and symmetric in the upper and lower  extremities. Cranial nerves II through XII are grossly intact. Proprioception is intact. No peripheral adenopathy or edema is identified. No motor or sensory levels are noted. Crude visual fields are within normal range.  RADIOLOGY RESULTS: Mammogram as well as MRI of brain reviewed compatible with above-stated findings  PLAN: Present time patient continues to do well from a breast standpoint with no evidence of disease 2 years I am pleased with her overall progress.  Of asked to see her back in 1 year for follow-up.  Patient knows to call with any concerns.  She continues treatment for her early Parkinson's disease.  I would like to take this opportunity to thank you for allowing me to participate in the care of your patient.Noreene Filbert, MD

## 2022-06-11 ENCOUNTER — Emergency Department
Admission: EM | Admit: 2022-06-11 | Discharge: 2022-06-12 | Disposition: A | Discharge status: Home / Self Care | Payer: BC Managed Care – PPO | Attending: Student in an Organized Health Care Education/Training Program | Admitting: Student in an Organized Health Care Education/Training Program

## 2022-06-11 ENCOUNTER — Emergency Department: Payer: BC Managed Care – PPO

## 2022-06-11 DIAGNOSIS — Z20822 Contact with and (suspected) exposure to covid-19: Secondary | ICD-10-CM | POA: Diagnosis not present

## 2022-06-11 DIAGNOSIS — R110 Nausea: Secondary | ICD-10-CM | POA: Insufficient documentation

## 2022-06-11 DIAGNOSIS — R5381 Other malaise: Secondary | ICD-10-CM | POA: Insufficient documentation

## 2022-06-11 DIAGNOSIS — R1031 Right lower quadrant pain: Secondary | ICD-10-CM | POA: Diagnosis not present

## 2022-06-11 DIAGNOSIS — R519 Headache, unspecified: Secondary | ICD-10-CM | POA: Insufficient documentation

## 2022-06-11 DIAGNOSIS — R200 Anesthesia of skin: Secondary | ICD-10-CM | POA: Diagnosis not present

## 2022-06-11 LAB — CBC
HCT: 42.5 % (ref 36.0–46.0)
Hemoglobin: 13.4 g/dL (ref 12.0–15.0)
MCH: 28.5 pg (ref 26.0–34.0)
MCHC: 31.5 g/dL (ref 30.0–36.0)
MCV: 90.4 fL (ref 80.0–100.0)
Platelets: 369 10*3/uL (ref 150–400)
RBC: 4.7 MIL/uL (ref 3.87–5.11)
RDW: 11.9 % (ref 11.5–15.5)
WBC: 9.5 10*3/uL (ref 4.0–10.5)
nRBC: 0 % (ref 0.0–0.2)

## 2022-06-11 LAB — LIPASE, BLOOD: Lipase: 37 U/L (ref 11–51)

## 2022-06-11 LAB — HEPATIC FUNCTION PANEL
ALT: 7 U/L (ref 0–44)
AST: 20 U/L (ref 15–41)
Albumin: 4.3 g/dL (ref 3.5–5.0)
Alkaline Phosphatase: 66 U/L (ref 38–126)
Bilirubin, Direct: <0.1 mg/dL (ref 0.0–0.2)
Total Bilirubin: 0.6 mg/dL (ref 0.3–1.2)
Total Protein: 7.9 g/dL (ref 6.5–8.1)

## 2022-06-11 LAB — BASIC METABOLIC PANEL
Anion gap: 8 (ref 5–15)
BUN: 11 mg/dL (ref 6–20)
CO2: 28 mmol/L (ref 22–32)
Calcium: 9.7 mg/dL (ref 8.9–10.3)
Chloride: 102 mmol/L (ref 98–111)
Creatinine, Ser: 0.76 mg/dL (ref 0.44–1.00)
GFR, Estimated: >60 mL/min (ref >60)
Glucose, Bld: 148 mg/dL — ABNORMAL HIGH (ref 70–99)
Potassium: 4.1 mmol/L (ref 3.5–5.1)
Sodium: 138 mmol/L (ref 135–145)

## 2022-06-11 LAB — SARS CORONAVIRUS 2 BY RT PCR: SARS Coronavirus 2 by RT PCR: NEGATIVE

## 2022-06-11 MED ORDER — IOHEXOL 300 MG/ML  SOLN
100.0000 mL | Freq: Once | INTRAMUSCULAR | Status: AC | PRN
Start: 2022-06-11 — End: 2022-06-11
  Administered 2022-06-11: 100 mL via INTRAVENOUS

## 2022-06-11 MED ORDER — SODIUM CHLORIDE 0.9 % IV BOLUS
500.0000 mL | Freq: Once | INTRAVENOUS | Status: AC
  Administered 2022-06-11: 500 mL via INTRAVENOUS

## 2022-06-11 MED ORDER — ACETAMINOPHEN 500 MG PO TABS
1000.0000 mg | ORAL_TABLET | Freq: Once | ORAL | Status: AC
  Administered 2022-06-11: 1000 mg via ORAL
  Filled 2022-06-11: qty 2

## 2022-06-11 MED ORDER — METOCLOPRAMIDE HCL 5 MG/ML IJ SOLN
10.0000 mg | Freq: Once | INTRAMUSCULAR | Status: AC
  Administered 2022-06-11: 10 mg via INTRAVENOUS
  Filled 2022-06-11: qty 2

## 2022-06-11 MED ORDER — DIPHENHYDRAMINE HCL 50 MG/ML IJ SOLN
12.5000 mg | Freq: Once | INTRAMUSCULAR | Status: AC
  Administered 2022-06-11: 12.5 mg via INTRAVENOUS
  Filled 2022-06-11: qty 1

## 2022-06-11 NOTE — ED Notes (Signed)
First nurse note-pt brought over from [facility] with weakness on the right side and numbness.  Pt has a h/a and dizziness. Bp 144/93, p104, sats 97% per nurse from [facility]  pt alert, in wheelchair in lobby.

## 2022-06-11 NOTE — ED Triage Notes (Signed)
Pt arrived via POV. Pt was at the [facility] urgent care when they told her to come over to the ER for treatment. Pt sts that she is having stomach pain since with N/V and having weakness all over. Pt is also complaining of numbness on her right side as well.

## 2022-06-11 NOTE — ED Provider Notes (Signed)
[facility] Provider Note    Event Date/Time   First MD Initiated Contact with Patient 06/11/22 2203     (approximate)   History   Numbness   HPI  Diane Cox is a 61 y.o. female presents to the ER for evaluation of nausea right lower quadrant abdominal pain headache generalized malaise some tingling in her right arm.  Denies any chest pain or cough no congestion.  No known sick contacts.  She status post cholecystectomy.  Denies any dysuria.  Does have some right flank pain.     Physical Exam   Triage Vital Signs: ED Triage Vitals  Enc Vitals Group     BP 06/11/22 1601 (!) 145/116     Pulse Rate 06/11/22 1601 100     Resp 06/11/22 1601 16     Temp 06/11/22 1601 (!) 100.5 F (38.1 C)     Temp Source 06/11/22 1601 Oral     SpO2 06/11/22 1601 96 %     Weight 06/11/22 1610 218 lb (98.9 kg)     Height --      Head Circumference --      Peak Flow --      Pain Score 06/11/22 1609 7     Pain Loc --      Pain Edu? --      Excl. in [place]? --     Most recent vital signs: Vitals:   06/11/22 1601 06/11/22 2234  BP: (!) 145/116 (!) 140/75  Pulse: 100 91  Resp: 16 15  Temp: (!) 100.5 F (38.1 C) 98.3 F (36.8 C)  SpO2: 96% 95%     Constitutional: Alert  Eyes: Conjunctivae are normal.  Head: Atraumatic. Nose: No congestion/rhinnorhea. Mouth/Throat: Mucous membranes are moist.   Neck: Painless ROM.  Cardiovascular:   Good peripheral circulation. Respiratory: Normal respiratory effort.  No retractions.  Gastrointestinal: Soft and nontender.  Musculoskeletal:  no deformity Neurologic:  MAE spontaneously. No gross focal neurologic deficits are appreciated.  Skin:  Skin is warm, dry and intact. No rash noted. Psychiatric: Mood and affect are normal. Speech and behavior are normal.    ED Results / Procedures / Treatments   Labs (all labs ordered are listed, but only abnormal results are displayed) Labs Reviewed  BASIC METABOLIC PANEL -  Abnormal; Notable for the following components:      Result Value   Glucose, Bld 148 (*)    All other components within normal limits  SARS CORONAVIRUS 2 BY RT PCR  CBC  HEPATIC FUNCTION PANEL  LIPASE, BLOOD  URINALYSIS, ROUTINE W REFLEX MICROSCOPIC  CBG MONITORING, ED     EKG  ED ECG REPORT I, Merlyn Lot, the attending physician, personally viewed and interpreted this ECG.   Date: 06/11/2022  EKG Time: 16:05  Rate: 95  Rhythm: sinus  Axis: normal  Intervals: normal  ST&T Change: no stemi, no depressions    RADIOLOGY Please see ED Course for my review and interpretation.  I personally reviewed all radiographic images ordered to evaluate for the above acute complaints and reviewed radiology reports and findings.  These findings were personally discussed with the patient.  Please see medical record for radiology report.    PROCEDURES:  Critical Care performed: No  Procedures   MEDICATIONS ORDERED IN ED: Medications  sodium chloride 0.9 % bolus 500 mL (500 mLs Intravenous New Bag/Given 06/11/22 2231)  metoCLOPramide (REGLAN) injection 10 mg (10 mg Intravenous Given 06/11/22 2246)  diphenhydrAMINE (BENADRYL) injection 12.5  mg (12.5 mg Intravenous Given 06/11/22 2245)  acetaminophen (TYLENOL) tablet 1,000 mg (1,000 mg Oral Given 06/11/22 2244)  iohexol (OMNIPAQUE) 300 MG/ML solution 100 mL (100 mLs Intravenous Contrast Given 06/11/22 2309)     IMPRESSION / MDM / ASSESSMENT AND PLAN / ED COURSE  I reviewed the triage vital signs and the nursing notes.                              Differential diagnosis includes, but is not limited to, colitis, diverticulitis, appendicitis, stone, pyelo-, COVID, CVA, radiculopathy, does not seem consistent with meningitis or encephalitis not consistent with discitis or epidural abscess  Patient presented to the ER for evaluation of symptoms as described above.  This presenting complaint could reflect a potentially life-threatening  illness therefore the patient will be placed on continuous pulse oximetry and telemetry for monitoring.  Laboratory evaluation will be sent to evaluate for the above complaints.  CT imaging ordered for above differential.  She is not toxic appearing.  Her exam is not consistent with meningitis or encephalitis.  She is well-appearing.  Doubt discitis epidural abscess given lack of risk factors and otherwise benign exam no focal deficits.  Will provide IV fluids symptomatic management and reassess    Clinical Course as of 06/11/22 2342  Wed Jun 11, 2022  2340 Patient's CT imaging is reassuring.  Will be signed out to oncoming physician pending follow-up urinalysis and reassessment.  [PR]    Clinical Course User Index [PR] Merlyn Lot, MD      FINAL CLINICAL IMPRESSION(S) / ED DIAGNOSES   Final diagnoses:  Nausea  Nonintractable headache, unspecified chronicity pattern, unspecified headache type     Rx / DC Orders   ED Discharge Orders     None        Note:  This document was prepared using Dragon voice recognition software and may include unintentional dictation errors.    Merlyn Lot, MD 06/11/22 2342

## 2022-06-11 NOTE — ED Provider Triage Note (Signed)
  Emergency Medicine Provider Triage Evaluation Note  Diane Cox , a 61 y.o.female,  was evaluated in triage.  Pt complains of headache x 4 days associated with numbness in the right upper extremity x 1 day.  She states that she has visual auras as well.  She has a history of migraines, however states that this is much different than her normal migraines.   Review of Systems  Positive: Headache, right upper extremity numbness. Negative: Denies fever, chest pain, vomiting  Physical Exam   Vitals:   06/11/22 1601  BP: (!) 145/116  Pulse: 100  Resp: 16  Temp: (!) 100.5 F (38.1 C)  SpO2: 96%   Gen:   Awake, no distress   Resp:  Normal effort MSK:   Moves extremities without difficulty  Other:  No gross neurological deficits.  No facial droop.  Medical Decision Making  Given the patient's initial medical screening exam, the following diagnostic evaluation has been ordered. The patient will be placed in the appropriate treatment space, once one is available, to complete the evaluation and treatment. I have discussed the plan of care with the patient and I have advised the patient that an ED physician or mid-level practitioner will reevaluate their condition after the test results have been received, as the results may give them additional insight into the type of treatment they may need.    Diagnostics: Labs, head/neck CT  Treatments: none immediately   Teodoro Spray, [place] 06/11/22 1746

## 2022-06-12 LAB — URINALYSIS, ROUTINE W REFLEX MICROSCOPIC
Bilirubin Urine: NEGATIVE
Glucose, UA: NEGATIVE mg/dL
Hgb urine dipstick: NEGATIVE
Ketones, ur: NEGATIVE mg/dL
Leukocytes,Ua: NEGATIVE
Nitrite: NEGATIVE
Protein, ur: NEGATIVE mg/dL
Specific Gravity, Urine: >1.046 — ABNORMAL HIGH (ref 1.005–1.030)
pH: 5 (ref 5.0–8.0)

## 2022-06-12 MED ORDER — ONDANSETRON 4 MG PO TBDP: 4.0000 mg | ORAL_TABLET | Freq: Three times a day (TID) | ORAL | 0 refills | Status: AC | PRN

## 2022-06-12 NOTE — ED Notes (Signed)
Pt presents to ED with c/o weakness. Pt states she has been feeling weakness that started about 2-3 ago. Pt also c/o nausea that started then as well. Pt denies vomiting. Pt also c/o diarrhea that started today as well.   Pt c/o numbness in her right arm. Upon testing sensation, pt could feel sensation in both arms. Pt denies Chest Pain and SOB

## 2022-06-12 NOTE — ED Provider Notes (Signed)
I assumed care the patient approximately 2300.  Please see op 1 providers note for full details Patient initial evaluation and assessment.  In brief patient presents for evaluation of fever and some crampy abdominal pain associate with general malaise intermittent headache and tingling.  Workup thus far including CT head, C-spine, chest x-ray as well as CT abdomen pelvis and labs are all reassuring.  There is no evidence of an intra-abdominal source or thoracic source on imaging of infection.  Plan is to follow-up UA if this is negative suspicion is for likely a viral etiology for symptoms.  UA is not appear infected on my assessment.  Patient is feeling a little better on my reassessment.  Discussed importance of close outpatient follow-up.  Will place Rx for Zofran.  Discharged stable condition   Lucrezia Starch, MD 06/12/22 0025

## 2022-06-12 NOTE — ED Notes (Addendum)
Pt states she feels a lot better. Denies nausea at this time

## 2022-06-12 NOTE — ED Notes (Signed)
Pt verbalized understanding of discharge instructions, prescription, and follow-up care instructions. Pt advised if symptoms worsen to return to ED.

## 2022-10-23 ENCOUNTER — Encounter: Payer: Self-pay | Admitting: Oncology

## 2022-10-23 DIAGNOSIS — D0511 Intraductal carcinoma in situ of right breast: Secondary | ICD-10-CM

## 2022-11-05 ENCOUNTER — Other Ambulatory Visit: Payer: Self-pay | Admitting: Oncology

## 2022-11-05 DIAGNOSIS — N6320 Unspecified lump in the left breast, unspecified quadrant: Secondary | ICD-10-CM

## 2022-11-11 ENCOUNTER — Ambulatory Visit
Admission: RE | Admit: 2022-11-11 | Discharge: 2022-11-11 | Disposition: A | Discharge status: Home / Self Care | Payer: BC Managed Care – PPO | Source: Ambulatory Visit | Attending: Oncology | Admitting: Oncology

## 2022-11-11 ENCOUNTER — Encounter: Payer: Self-pay | Admitting: Oncology

## 2022-11-11 DIAGNOSIS — R928 Other abnormal and inconclusive findings on diagnostic imaging of breast: Secondary | ICD-10-CM

## 2022-11-11 DIAGNOSIS — N6320 Unspecified lump in the left breast, unspecified quadrant: Secondary | ICD-10-CM | POA: Diagnosis present

## 2022-11-11 DIAGNOSIS — D0511 Intraductal carcinoma in situ of right breast: Secondary | ICD-10-CM | POA: Diagnosis not present

## 2022-11-12 ENCOUNTER — Other Ambulatory Visit: Payer: Self-pay | Admitting: Oncology

## 2022-11-12 DIAGNOSIS — R921 Mammographic calcification found on diagnostic imaging of breast: Secondary | ICD-10-CM

## 2022-11-12 DIAGNOSIS — R928 Other abnormal and inconclusive findings on diagnostic imaging of breast: Secondary | ICD-10-CM

## 2022-11-13 ENCOUNTER — Inpatient Hospital Stay: Payer: BC Managed Care – PPO | Attending: Oncology | Admitting: Oncology

## 2022-11-13 DIAGNOSIS — D0511 Intraductal carcinoma in situ of right breast: Secondary | ICD-10-CM | POA: Diagnosis not present

## 2022-11-13 NOTE — Progress Notes (Signed)
[place]  [phone] [phone]  ID: Terese Door OB: 1961-07-24  MR#: [id number]  CWC#:[id number]  Patient Care Team: [name], MD as PCP - General (Family Medicine) Theodore Demark, RN (Inactive) as Registered Nurse (Oncology) Rico Junker, RN as Registered Nurse (Oncology) Herbert Pun, MD as Consulting Physician (General Surgery) Noreene Filbert, MD as Referring Physician (Radiation Oncology) Lloyd Huger, MD as Consulting Physician (Oncology)  I connected with Terese Door on 11/17/22 at  2:45 PM EST by video enabled telemedicine visit and verified that I am speaking with the correct person using two identifiers.   I discussed the limitations, risks, security and privacy concerns of performing an evaluation and management service by telemedicine and the availability of in-person appointments. I also discussed with the patient that there may be a patient responsible charge related to this service. The patient expressed understanding and agreed to proceed.   Other persons participating in the visit and their role in the encounter: Patient, MD.  Patient's location: Home. Provider's location: Clinic.  CHIEF COMPLAINT: Right breast DCIS  INTERVAL HISTORY: Patient agreed to video assisted telemedicine visit for her routine 86-monthevaluation.  She currently feels well and is asymptomatic.  She continues to tolerate tamoxifen well without significant side effects.  She has no new neurologic complaints. She denies any recent fevers or illnesses.  She has a good appetite.  She has no chest pain, shortness of breath, cough, or hemoptysis.  She denies any nausea, vomiting, constipation, or diarrhea.  She has no urinary complaints.  Patient offers no specific complaints today.  REVIEW OF SYSTEMS:   Review of Systems  Constitutional: Negative.  Negative for fever, malaise/fatigue and weight loss.  Respiratory: Negative.   Negative for cough, hemoptysis and shortness of breath.   Cardiovascular: Negative.  Negative for chest pain and leg swelling.  Gastrointestinal: Negative.  Negative for abdominal pain.  Genitourinary: Negative.  Negative for dysuria.  Musculoskeletal: Negative.  Negative for back pain.  Skin: Negative.  Negative for rash.  Neurological: Negative.  Negative for dizziness, tremors, focal weakness, weakness and headaches.  Psychiatric/Behavioral: Negative.  The patient is not nervous/anxious.     As per HPI. Otherwise, a complete review of systems is negative.  PAST MEDICAL HISTORY: Past Medical History:  Diagnosis Date   Anemia    Arthritis    Asthma    WELL CONTROLLED   [place]    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 17/61/60  Complication of anesthesia    DDD (degenerative disc disease), cervical    AND SPINE   Diabetes mellitus without complication (HCC)    Elevated lipids    Family history of breast cancer    Family history of colon cancer    Family history of uterine cancer    Fatty liver    H/O   GERD (gastroesophageal reflux disease)    History of kidney stones    H/O   IBS (irritable bowel syndrome)    IBS (irritable bowel syndrome)    Migraines    Obesity    Personal history of radiation therapy    PONV (postoperative nausea and vomiting)    PHENERGAN WORKS    Seasonal allergies    Sleep apnea    USES CPAP    PAST SURGICAL HISTORY: Past Surgical History:  Procedure Laterality Date   ABDOMINAL HYSTERECTOMY     BREAST BIOPSY Right 11/17/2019   affirm bx, coil marker, DUCTAL CARCINOMA IN SITU,  INTERMEDIATE GRADE   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, clear marginsd   CHOLECYSTECTOMY     COLONOSCOPY WITH PROPOFOL N/A 11/25/2016   Procedure: COLONOSCOPY WITH PROPOFOL;  Surgeon: Lollie Sails, MD;  Location[facility] ENDOSCOPY;  Service: Endoscopy;  Laterality: N/A;   COLONOSCOPY WITH PROPOFOL N/A 07/04/2021   Procedure: COLONOSCOPY WITH PROPOFOL;   Surgeon: Annamaria Helling, DO;  Location: [facility] ENDOSCOPY;  Service: Gastroenterology;  Laterality: N/A;  DM   ESOPHAGOGASTRODUODENOSCOPY (EGD) WITH PROPOFOL N/A 11/25/2016   Procedure: ESOPHAGOGASTRODUODENOSCOPY (EGD) WITH PROPOFOL;  Surgeon: Lollie Sails, MD;  Location: [facility] ENDOSCOPY;  Service: Endoscopy;  Laterality: N/A;   EXCISION OF ENDOMETRIOMA     PARTIAL MASTECTOMY WITH NEEDLE LOCALIZATION Right 12/19/2019   Procedure: PARTIAL MASTECTOMY WITH NEEDLE LOCALIZATION;  Surgeon: Herbert Pun, MD;  Location: [facility] ORS;  Service: General;  Laterality: Right;   TONSILLECTOMY      FAMILY HISTORY: Family History  Problem Relation Age of Onset   Heart attack Father    Uterine cancer Mother        dx 74s   Breast cancer Maternal Aunt        dx 22s, recurrence in 73s   Uterine cancer Maternal Grandmother        dx 50s-60s   Uterine cancer Sister        dx 67s   Bone cancer Brother        dx 37s   Colon cancer Maternal Aunt    Stomach cancer Maternal Aunt    Throat cancer Maternal Aunt    Colon cancer Cousin     ADVANCED DIRECTIVES (Y/N):  N  HEALTH MAINTENANCE: Social History   Tobacco Use   Smoking status: Never   Smokeless tobacco: Never  Vaping Use   Vaping Use: Never used  Substance Use Topics   Alcohol use: Yes    Alcohol/week: 0.0 standard drinks of alcohol    Comment: occasional   Drug use: No     Colonoscopy:  PAP:  Bone density:  Lipid panel:  Allergies  Allergen Reactions   Chicken Allergy Rash   Compazine [Prochlorperazine Edisylate] Nausea And Vomiting and Anxiety   Other Rash    BLOOD THINNERS-THE COMPONENT IN BLOOD THINNERS CAUSE A RASH AT INJECTION SITE BLOOD THINNERS-THE COMPONENT IN BLOOD THINNERS CAUSE A RASH AT INJECTION SITE   Pork-Derived Products Rash    Current Outpatient Medications  Medication Sig Dispense Refill   albuterol (PROVENTIL HFA;VENTOLIN HFA) 108 (90 BASE) MCG/ACT inhaler Inhale 4-6 puffs by mouth every 4  hours as needed for wheezing, cough, and/or shortness of breath (Patient taking differently: Inhale 1-2 puffs into the lungs every 4 (four) hours as needed (wheeze, cough and/or shortness of breath).) 1 Inhaler 1   BREO ELLIPTA 100-25 MCG/INH AEPB Inhale 1 puff into the lungs daily as needed (respiratory issues.).      carbidopa-levodopa (SINEMET IR) 25-100 MG tablet Take 1 tablet by mouth 3 (three) times daily.     cetirizine (ZYRTEC) 10 MG tablet Take 10 mg by mouth every evening.     Cholecalciferol (VITAMIN D-3) 125 MCG (5000 UT) TABS Take 5,000 Units by mouth at bedtime.     colestipol (COLESTID) 1 g tablet Take by mouth.     cyclobenzaprine (FLEXERIL) 5 MG tablet Take 5 mg by mouth at bedtime.     escitalopram (LEXAPRO) 10 MG tablet Take 10 mg by mouth every morning.      fluticasone (FLONASE) 50 MCG/ACT nasal spray  Place 2 sprays into the nose daily as needed (allergies.).  (Patient not taking: Reported on 05/08/2021)     glipiZIDE (GLUCOTROL XL) 10 MG 24 hr tablet Take 10 tablets by mouth 2 (two) times daily.     hydrochlorothiazide (MICROZIDE) 12.5 MG capsule Take 12.5 mg by mouth daily.      hyoscyamine (LEVSIN) 0.125 MG tablet Take 0.125 mg by mouth every 6 (six) hours as needed. PT HAS NOT PICKED UP RX AS OF 12-14-19     Magnesium Oxide (MAG-200 PO) Take 200 mg by mouth at bedtime.     metFORMIN (GLUCOPHAGE-XR) 500 MG 24 hr tablet Take by mouth.     Methylcellulose, Laxative, (CITRUCEL) 500 MG TABS Take 500 mg by mouth daily.     montelukast (SINGULAIR) 10 MG tablet Take 10 mg by mouth every morning.      NALTREXONE HCL PO Take by mouth.     pantoprazole (PROTONIX) 40 MG tablet Take 40 mg by mouth every morning.      Potassium 99 MG TABS Take 99 mg by mouth daily.      Probiotic Product (DIGESTIVE ADVANTAGE GUMMIES PO) Take 2 tablets by mouth every evening.     rosuvastatin (CRESTOR) 10 MG tablet Take 40 mg by mouth daily.     silver sulfADIAZINE (SILVADENE) 1 % cream Apply 1  application topically 2 (two) times daily. 50 g 2   sucralfate (CARAFATE) 1 G tablet Take 1 g by mouth 2 (two) times daily.      tamoxifen (NOLVADEX) 20 MG tablet Take 1 tablet (20 mg total) by mouth daily. 90 tablet 3   topiramate (TOPAMAX) 25 MG tablet Take 25 mg by mouth 2 (two) times daily.     vitamin E 180 MG (400 UNITS) capsule Take by mouth.     No current facility-administered medications for this visit.    OBJECTIVE: There were no vitals filed for this visit.    There is no height or weight on file to calculate BMI.    ECOG FS:0 - Asymptomatic  General: Well-developed, well-nourished, no acute distress. HEENT: Normocephalic. Neuro: Alert, answering all questions appropriately. Cranial nerves grossly intact. Psych: Normal affect.  LAB RESULTS:  Lab Results  Component Value Date   NA 138 06/11/2022   K 4.1 06/11/2022   CL 102 06/11/2022   CO2 28 06/11/2022   GLUCOSE 148 (H) 06/11/2022   BUN 11 06/11/2022   CREATININE 0.76 06/11/2022   CALCIUM 9.7 06/11/2022   PROT 7.9 06/11/2022   ALBUMIN 4.3 06/11/2022   AST 20 06/11/2022   ALT 7 06/11/2022   ALKPHOS 66 06/11/2022   BILITOT 0.6 06/11/2022   GFRNONAA >60 06/11/2022   GFRAA >60 06/17/2019    Lab Results  Component Value Date   WBC 9.5 06/11/2022   NEUTROABS 5.1 06/03/2021   HGB 13.4 06/11/2022   HCT 42.5 06/11/2022   MCV 90.4 06/11/2022   PLT 369 06/11/2022     STUDIES: MM DIAG BREAST TOMO BILATERAL  Result Date: 11/11/2022 CLINICAL DATA:  History of RIGHT DCIS status post lumpectomy February 2021 with radiation. Palpable area in the LEFT lower breast for several months described as a region of firmness. EXAM: DIGITAL DIAGNOSTIC BILATERAL MAMMOGRAM WITH TOMOSYNTHESIS; ULTRASOUND LEFT BREAST LIMITED TECHNIQUE: Bilateral digital diagnostic mammography and breast tomosynthesis was performed.; Targeted ultrasound examination of the left breast was performed. COMPARISON:  Previous exam(s). ACR Breast Density  Category c: The breast tissue is heterogeneously dense, which may obscure small masses.  FINDINGS: There is density and architectural distortion within the RIGHT breast, consistent with postsurgical changes. These are stable in comparison to prior. In the RIGHT inner breast, there are 2 new groups of calcifications as follows: Group 1: A 4 mm group of coarse heterogeneous calcifications in the inner breast at middle to posterior depth. Group 2: 3 loosely grouped punctate calcifications spanning 3 mm in the RIGHT lower inner breast at anterior to middle depth. Spot compression tomosynthesis views were obtained over the palpable area of concern in the LEFT breast. No suspicious mammographic finding is identified in this area. Fat density is noted subjacent to the site of palpable concern. No suspicious mass, microcalcification, or other finding is identified in the LEFT breast. On physical exam, no suspicious mass is appreciated. Targeted LEFT breast ultrasound was performed in the palpable area of concern at the lower inner breast. No suspicious solid or cystic mass is identified. IMPRESSION: 1. A 4 mm group of calcifications in the RIGHT inner breast at middle to posterior depth are indeterminate (group 1). Recommend stereotactic guided biopsy for definitive characterization. 2. With benign biopsy results, recommend follow-up diagnostic mammogram in 6 months of a probably benign 3 mm group of 3 punctate calcifications in the RIGHT inner breast at anterior to middle depth. These are favored to reflect early dystrophic calcifications. If there are malignant/atypical results and breast conservation surgery is being considered, recommend second site stereotactic guided biopsy. 3. No mammographic or sonographic evidence of malignancy at the site of palpable concern in the LEFT breast. No mammographic evidence of malignancy in the LEFT breast. Any further workup of the patient's symptoms should be based on the clinical  assessment. RECOMMENDATION: 1. RIGHT breast stereotactic guided biopsy x1 I have discussed the findings and recommendations with the patient. The biopsy procedure was discussed with the patient and questions were answered. Patient expressed their understanding of the biopsy recommendation. Patient will be scheduled for biopsy at her earliest convenience by the schedulers. Ordering provider will be notified. If applicable, a reminder letter will be sent to the patient regarding the next appointment. BI-RADS CATEGORY  4: Suspicious. Electronically Signed   By: Valentino Saxon M.D.   On: 11/11/2022 16:08  US BREAST LTD UNI LEFT INC AXILLA  Result Date: 11/11/2022 CLINICAL DATA:  History of RIGHT DCIS status post lumpectomy February 2021 with radiation. Palpable area in the LEFT lower breast for several months described as a region of firmness. EXAM: DIGITAL DIAGNOSTIC BILATERAL MAMMOGRAM WITH TOMOSYNTHESIS; ULTRASOUND LEFT BREAST LIMITED TECHNIQUE: Bilateral digital diagnostic mammography and breast tomosynthesis was performed.; Targeted ultrasound examination of the left breast was performed. COMPARISON:  Previous exam(s). ACR Breast Density Category c: The breast tissue is heterogeneously dense, which may obscure small masses. FINDINGS: There is density and architectural distortion within the RIGHT breast, consistent with postsurgical changes. These are stable in comparison to prior. In the RIGHT inner breast, there are 2 new groups of calcifications as follows: Group 1: A 4 mm group of coarse heterogeneous calcifications in the inner breast at middle to posterior depth. Group 2: 3 loosely grouped punctate calcifications spanning 3 mm in the RIGHT lower inner breast at anterior to middle depth. Spot compression tomosynthesis views were obtained over the palpable area of concern in the LEFT breast. No suspicious mammographic finding is identified in this area. Fat density is noted subjacent to the site of  palpable concern. No suspicious mass, microcalcification, or other finding is identified in the LEFT breast. On physical exam, no  suspicious mass is appreciated. Targeted LEFT breast ultrasound was performed in the palpable area of concern at the lower inner breast. No suspicious solid or cystic mass is identified. IMPRESSION: 1. A 4 mm group of calcifications in the RIGHT inner breast at middle to posterior depth are indeterminate (group 1). Recommend stereotactic guided biopsy for definitive characterization. 2. With benign biopsy results, recommend follow-up diagnostic mammogram in 6 months of a probably benign 3 mm group of 3 punctate calcifications in the RIGHT inner breast at anterior to middle depth. These are favored to reflect early dystrophic calcifications. If there are malignant/atypical results and breast conservation surgery is being considered, recommend second site stereotactic guided biopsy. 3. No mammographic or sonographic evidence of malignancy at the site of palpable concern in the LEFT breast. No mammographic evidence of malignancy in the LEFT breast. Any further workup of the patient's symptoms should be based on the clinical assessment. RECOMMENDATION: 1. RIGHT breast stereotactic guided biopsy x1 I have discussed the findings and recommendations with the patient. The biopsy procedure was discussed with the patient and questions were answered. Patient expressed their understanding of the biopsy recommendation. Patient will be scheduled for biopsy at her earliest convenience by the schedulers. Ordering provider will be notified. If applicable, a reminder letter will be sent to the patient regarding the next appointment. BI-RADS CATEGORY  4: Suspicious. Electronically Signed   By: Valentino Saxon M.D.   On: 11/11/2022 16:08   ASSESSMENT: Right breast DCIS.  PLAN:    1.  Right breast DCIS: Patient underwent lumpectomy on December 19, 2019 confirming diagnosis.  Previously, she declined  enrollment in the COMET trial.  Patient completed adjuvant XRT on Mar 14, 2020.  Continue tamoxifen for total of 5 years completing treatment in May 2026.  Her most recent mammogram on November 11, 2022 was reported as BI-RADS 4 and she has a biopsy scheduled in the next several weeks.  If biopsy is negative, patient will f have a video assisted telemedicine visit in 6 months.  If positive, will arrange in person follow-up sooner.     2.  Genetics: Negative.  3.  IBS: Resolved.  Continue evaluation and treatment per GI.  Patient had a recent colonoscopy that removed several polyps, but otherwise no significant pathology. 4.  Parkinson's: Improved with current medications.  Continue follow-up with neurology as indicated.  I provided 20 minutes of face-to-face video visit time during this encounter which included chart review, counseling, and coordination of care as documented above.    Patient expressed understanding and was in agreement with this plan. She also understands that She can call clinic at any time with any questions, concerns, or complaints.    Cancer Staging  Ductal carcinoma in situ (DCIS) of right breast Staging form: Breast, AJCC 8th Edition - Clinical stage from 11/25/2019: Stage 0 (cTis (DCIS), cN0, cM0, ER+, PR+, HER2-) - Signed by Lloyd Huger, MD on 11/25/2019 Stage prefix: Initial diagnosis Nuclear grade: G2   Lloyd Huger, MD   11/17/2022 9:27 PM

## 2022-11-18 ENCOUNTER — Ambulatory Visit
Admission: RE | Admit: 2022-11-18 | Discharge: 2022-11-18 | Disposition: A | Discharge status: Home / Self Care | Payer: BC Managed Care – PPO | Source: Ambulatory Visit | Attending: Oncology | Admitting: Oncology

## 2022-11-18 ENCOUNTER — Other Ambulatory Visit: Payer: Self-pay | Admitting: Oncology

## 2022-11-18 DIAGNOSIS — R928 Other abnormal and inconclusive findings on diagnostic imaging of breast: Secondary | ICD-10-CM | POA: Diagnosis present

## 2022-11-18 DIAGNOSIS — R921 Mammographic calcification found on diagnostic imaging of breast: Secondary | ICD-10-CM

## 2022-11-18 HISTORY — PX: BREAST BIOPSY: SHX20

## 2022-11-18 MED ORDER — LIDOCAINE HCL (PF) 1 % IJ SOLN
5.0000 mL | Freq: Once | INTRAMUSCULAR | Status: AC
  Administered 2022-11-18: 5 mL

## 2022-11-18 MED ORDER — LIDOCAINE-EPINEPHRINE 1 %-1:100000 IJ SOLN
10.0000 mL | Freq: Once | INTRAMUSCULAR | Status: AC
  Administered 2022-11-18: 10 mL

## 2022-11-19 ENCOUNTER — Other Ambulatory Visit: Payer: BC Managed Care – PPO

## 2022-11-19 LAB — SURGICAL PATHOLOGY

## 2023-04-26 ENCOUNTER — Other Ambulatory Visit: Payer: Self-pay | Admitting: Oncology

## 2023-05-14 ENCOUNTER — Telehealth: Payer: BC Managed Care – PPO | Admitting: Oncology

## 2023-05-25 ENCOUNTER — Ambulatory Visit: Payer: BC Managed Care – PPO | Admitting: Radiation Oncology
